# Patient Record
Sex: Male | Born: 1965 | Race: White | Hispanic: No | Smoking: Current some day smoker
Health system: Southern US, Community
[De-identification: ages and names within clinical notes are randomized; demographics above are authoritative.]

## PROBLEM LIST (undated history)

## (undated) DIAGNOSIS — I2510 Atherosclerotic heart disease of native coronary artery without angina pectoris: Secondary | ICD-10-CM

## (undated) DIAGNOSIS — F418 Other specified anxiety disorders: Secondary | ICD-10-CM

## (undated) DIAGNOSIS — Z9889 Other specified postprocedural states: Secondary | ICD-10-CM

## (undated) DIAGNOSIS — G43909 Migraine, unspecified, not intractable, without status migrainosus: Secondary | ICD-10-CM

## (undated) DIAGNOSIS — R569 Unspecified convulsions: Secondary | ICD-10-CM

## (undated) DIAGNOSIS — E785 Hyperlipidemia, unspecified: Secondary | ICD-10-CM

## (undated) HISTORY — DX: Migraine, unspecified, not intractable, without status migrainosus: G43.909

## (undated) HISTORY — PX: CHOLECYSTECTOMY: SHX55

## (undated) HISTORY — DX: Unspecified convulsions: R56.9

## (undated) HISTORY — DX: Other specified anxiety disorders: F41.8

---

## 2017-08-07 DIAGNOSIS — I2510 Atherosclerotic heart disease of native coronary artery without angina pectoris: Secondary | ICD-10-CM

## 2017-08-07 HISTORY — DX: Atherosclerotic heart disease of native coronary artery without angina pectoris: I25.10

## 2017-08-22 ENCOUNTER — Emergency Department (HOSPITAL_COMMUNITY): Payer: 59

## 2017-08-22 ENCOUNTER — Observation Stay (HOSPITAL_COMMUNITY)
Admission: EM | Admit: 2017-08-22 | Discharge: 2017-08-24 | Disposition: A | Discharge status: Home / Self Care | Payer: 59 | Attending: Cardiovascular Disease | Admitting: Cardiovascular Disease

## 2017-08-22 ENCOUNTER — Encounter (HOSPITAL_COMMUNITY): Payer: Self-pay | Admitting: *Deleted

## 2017-08-22 DIAGNOSIS — R55 Syncope and collapse: Secondary | ICD-10-CM | POA: Diagnosis present

## 2017-08-22 DIAGNOSIS — E785 Hyperlipidemia, unspecified: Secondary | ICD-10-CM | POA: Diagnosis not present

## 2017-08-22 DIAGNOSIS — Z8249 Family history of ischemic heart disease and other diseases of the circulatory system: Secondary | ICD-10-CM | POA: Diagnosis not present

## 2017-08-22 DIAGNOSIS — Z955 Presence of coronary angioplasty implant and graft: Secondary | ICD-10-CM

## 2017-08-22 DIAGNOSIS — Z87891 Personal history of nicotine dependence: Secondary | ICD-10-CM | POA: Insufficient documentation

## 2017-08-22 DIAGNOSIS — Z23 Encounter for immunization: Secondary | ICD-10-CM | POA: Diagnosis not present

## 2017-08-22 DIAGNOSIS — E7800 Pure hypercholesterolemia, unspecified: Secondary | ICD-10-CM | POA: Diagnosis not present

## 2017-08-22 DIAGNOSIS — Z7982 Long term (current) use of aspirin: Secondary | ICD-10-CM | POA: Insufficient documentation

## 2017-08-22 DIAGNOSIS — R0789 Other chest pain: Secondary | ICD-10-CM | POA: Diagnosis not present

## 2017-08-22 DIAGNOSIS — M549 Dorsalgia, unspecified: Secondary | ICD-10-CM | POA: Diagnosis present

## 2017-08-22 DIAGNOSIS — R079 Chest pain, unspecified: Secondary | ICD-10-CM | POA: Diagnosis present

## 2017-08-22 DIAGNOSIS — I2510 Atherosclerotic heart disease of native coronary artery without angina pectoris: Secondary | ICD-10-CM | POA: Insufficient documentation

## 2017-08-22 HISTORY — DX: Other specified postprocedural states: Z98.890

## 2017-08-22 HISTORY — DX: Hyperlipidemia, unspecified: E78.5

## 2017-08-22 LAB — CBC
HCT: 45.2 % (ref 39.0–52.0)
Hemoglobin: 16.2 g/dL (ref 13.0–17.0)
MCH: 32.3 pg (ref 26.0–34.0)
MCHC: 35.8 g/dL (ref 30.0–36.0)
MCV: 90.2 fL (ref 78.0–100.0)
Platelets: 150 10*3/uL (ref 150–400)
RBC: 5.01 MIL/uL (ref 4.22–5.81)
RDW: 13 % (ref 11.5–15.5)
WBC: 6.7 10*3/uL (ref 4.0–10.5)

## 2017-08-22 LAB — BASIC METABOLIC PANEL
Anion gap: 9 (ref 5–15)
BUN: 14 mg/dL (ref 6–20)
CO2: 23 mmol/L (ref 22–32)
Calcium: 8.9 mg/dL (ref 8.9–10.3)
Chloride: 107 mmol/L (ref 101–111)
Creatinine, Ser: 0.94 mg/dL (ref 0.61–1.24)
GFR calc Af Amer: >60 mL/min (ref >60)
GFR calc non Af Amer: >60 mL/min (ref >60)
Glucose, Bld: 105 mg/dL — ABNORMAL HIGH (ref 65–99)
Potassium: 3.3 mmol/L — ABNORMAL LOW (ref 3.5–5.1)
Sodium: 139 mmol/L (ref 135–145)

## 2017-08-22 LAB — URINALYSIS, ROUTINE W REFLEX MICROSCOPIC
Bilirubin Urine: NEGATIVE
Glucose, UA: NEGATIVE mg/dL
Hgb urine dipstick: NEGATIVE
Ketones, ur: NEGATIVE mg/dL
Leukocytes, UA: NEGATIVE
Nitrite: NEGATIVE
Protein, ur: NEGATIVE mg/dL
Specific Gravity, Urine: 1.017 (ref 1.005–1.030)
pH: 6 (ref 5.0–8.0)

## 2017-08-22 LAB — CBG MONITORING, ED: Glucose-Capillary: 117 mg/dL — ABNORMAL HIGH (ref 65–99)

## 2017-08-22 LAB — TROPONIN I: Troponin I: <0.03 ng/mL (ref <0.03)

## 2017-08-22 MED ORDER — IOPAMIDOL (ISOVUE-370) INJECTION 76%
100.0000 mL | Freq: Once | INTRAVENOUS | Status: AC | PRN
  Administered 2017-08-22: 100 mL via INTRAVENOUS

## 2017-08-22 MED ORDER — ASPIRIN 81 MG PO CHEW
324.0000 mg | CHEWABLE_TABLET | Freq: Once | ORAL | Status: AC
  Administered 2017-08-22: 324 mg via ORAL
  Filled 2017-08-22: qty 4

## 2017-08-22 MED ORDER — GI COCKTAIL ~~LOC~~: 30.0000 mL | Freq: Four times a day (QID) | ORAL | Status: DC | PRN

## 2017-08-22 MED ORDER — MORPHINE SULFATE (PF) 2 MG/ML IV SOLN: 2.0000 mg | INTRAVENOUS | Status: DC | PRN

## 2017-08-22 MED ORDER — ENOXAPARIN SODIUM 40 MG/0.4ML ~~LOC~~ SOLN
40.0000 mg | SUBCUTANEOUS | Status: DC
  Filled 2017-08-22: qty 0.4

## 2017-08-22 MED ORDER — ONDANSETRON HCL 4 MG/2ML IJ SOLN
4.0000 mg | Freq: Four times a day (QID) | INTRAMUSCULAR | Status: DC | PRN
Start: 2017-08-22 — End: 2017-08-23

## 2017-08-22 MED ORDER — INFLUENZA VAC SPLIT QUAD 0.5 ML IM SUSY
0.5000 mL | PREFILLED_SYRINGE | INTRAMUSCULAR | Status: AC
  Administered 2017-08-24: 08:00:00 0.5 mL via INTRAMUSCULAR
  Filled 2017-08-22: qty 0.5

## 2017-08-22 MED ORDER — ACETAMINOPHEN 325 MG PO TABS: 650.0000 mg | ORAL_TABLET | ORAL | Status: DC | PRN

## 2017-08-22 MED ORDER — SODIUM CHLORIDE 0.9 % IV SOLN
INTRAVENOUS | Status: DC
  Administered 2017-08-22: 19:00:00 via INTRAVENOUS

## 2017-08-22 NOTE — H&P (Signed)
[facility] History and Physical  Charles Tyler YQM:[id number] DOB: 1966/08/31 DOA: 08/22/2017  Referring physician: DR Wilson Singer PCP: Patient, No Pcp Per   Chief Complaint: Chest pain  HPI: Charles Tyler is a 51 y.o. male with hx of hyperlipidemia, CAD w 2 stents ([place], 2016), +family hx CAD (father CABG 4 yo) presenting with chest pain at work today.  Came to ED where EKG and trop were negative.  Patient also c/o back pain, so CT angio of chest was done which did not show any dissection.  CXR was negative.  We are asked to admit for CP.    Patient also says he has been "passing out", "4 times " since he moved here in June 2018.  Reports that he passed out last night after getting up from bed to urinate, while walking back to bed per his girlfirend who he lives with, she "heard a thud" and he was on the floor.  No signs of seizure activity, incontinence and patient didn't recall the event so didn't have much to report about it.  Then today at work patient developed an exertional SSCP w/ upper abd component then started going "into my back" and down both arms.  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  No sweats or N/V, +felt clammy.  Came to ED where EKG was wnl.    Patient says when living in [place] 2 yrs ago they put "2 stents in my heart".  Says he didn't have a major heart attack but they were concerned because of his high cholesterol and family history.  Denies any hx of HTN or DM, no hx claudication or PVD or CVA.  Has some LE pains which he feels is related to a disc in his back.  +etoh, not heavy, no drug use, former smoker.  PSH = cholecystectomy.     Chart: -    ROS  no joint pain   no HA  no blurry vision  no rash  no diarrhea  no nausea/ vomiting  no dysuria  no difficulty voiding  no change in urine color    Past Medical History  Past Medical History:  Diagnosis Date  . High  cholesterol    Past Surgical History  Past Surgical History:  Procedure Laterality Date  . CHOLECYSTECTOMY     Family History No family history on file. Social History  reports that he has quit smoking. He has never used smokeless tobacco. He reports that he drinks alcohol. He reports that he does not use drugs. Allergies No Known Allergies Home medications Prior to Admission medications   Medication Sig Start Date End Date Taking? Authorizing Provider  aspirin EC 81 MG tablet Take 243 mg by mouth once as needed for mild pain or moderate pain.   Yes [provider]  ibuprofen (ADVIL,MOTRIN) 200 MG tablet Take 200 mg by mouth every 6 (six) hours as needed for mild pain or moderate pain.   Yes [provider]   Liver Function Tests No results for input(s): AST, ALT, ALKPHOS, BILITOT, PROT, ALBUMIN in the last 168 hours. No results for input(s): LIPASE, AMYLASE in the last 168 hours. CBC  Recent Labs Lab 08/22/17 1731  WBC 6.7  HGB 16.2  HCT 45.2  MCV 90.2  PLT 528   Basic Metabolic Panel  Recent Labs Lab 08/22/17 1731  NA 139  K 3.3*  CL 107  CO2 23  GLUCOSE 105*  BUN 14  CREATININE 0.94  CALCIUM 8.9     Vitals:   08/22/17 1900 08/22/17 1930 08/22/17 2000 08/22/17 2100  BP: 138/86 138/90 138/90 (!) 143/86  Pulse: (!) 59 (!) 59 70 65  Resp: 12 11 13 16   Temp:      TempSrc:      SpO2: 99% 99% 99% 99%  Weight:      Height:       Exam: Gen alert, looks tired, no distress No rash, cyanosis or gangrene Sclera anicteric, throat clear  No jvd or bruits Chest clear bilat RRR no MRG Abd soft ntnd no mass or ascites +bs GU normal male MS no joint effusions or deformity Ext no LE edema / no wounds or ulcers Neuro is alert, Ox 3 , nf Good pulses x 4 ext, no bruits    Home meds: -ecasa, prn advil  EKG (independ reviewed) > NSR, no acute changes CXR (independ reviewed) > no active disease CT angio chest/ abd  > IMPRESSION: 1. Negative  for aortic dissection or aortic aneurysm. 2. Mild apical emphysema.  Clear lung fields 3. Questionable filling defect at the portal splenic confluence with underfilling of the SMV ; suspect findings are related to arterial phase imaging, but cannot exclude nonocclusive thrombus. Could correlate with portal Doppler imaging to confirm patency of the portal, splenic, and superior mesenteric veins. 4. Minimal atherosclerotic calcification. No significant vascular disease of the major branch vessels in the abdomen    Assessment: 1. Chest pain - in patient w/ hx hyperlipidemia, family hx CAD and hx of "2 stents" placed in heart in [place] 2 yrs ago.  EKG/ trop here are wnl. Will admit, get serial trop and consult cardiology in am.   2. HL 3. Back pain - CTA neg for dissection 4. Syncope - recurrent per patient.  Post-micturition in this instance. Not sure significance.  No arrhythmia at this time.      Plan - as above       Ruth D [facility] Pager [phone]   If 7PM-7AM, please contact night-coverage [email or web address] Password [email or web address] 08/22/2017, 9:48 PM

## 2017-08-22 NOTE — ED Provider Notes (Signed)
[facility] EMERGENCY DEPARTMENT Provider Note   CSN: [id number] Arrival date & time: 08/22/17  1642     History   Chief Complaint Chief Complaint  Patient presents with  . Chest Pain  . Loss of Consciousness    HPI Dontrae Morini is a 51 y.o. male.  HPI  51 year old male with chest pain. Onset this afternoon. Describes an ache in the center of his chest with burning sensation radiating between his shoulder blades and sometimes into L arm. Intermittent throughout the day. Worsened with exertion. Improved with rest. Associated with mild dyspnea. No nausea, diaphoresis or palpitations. Also reports that his girlfriend told him that he had a syncopal this morning when going to the bathroom although he says he doesn't recall this. No known CAD. Hx of high cholesterol. Grandfather with MI while in his 86s. Dad with CABG in 67s. Former smoker but stopped 10y ago. Denies drug use.   Past Medical History:  Diagnosis Date  . High cholesterol     There are no active problems to display for this patient.   Past Surgical History:  Procedure Laterality Date  . CHOLECYSTECTOMY         Home Medications    Prior to Admission medications   Medication Sig Start Date End Date Taking? Authorizing Provider  aspirin EC 81 MG tablet Take 243 mg by mouth once as needed for mild pain or moderate pain.   Yes [provider]  ibuprofen (ADVIL,MOTRIN) 200 MG tablet Take 200 mg by mouth every 6 (six) hours as needed for mild pain or moderate pain.   Yes [provider]    Family History No family history on file.  Social History Social History  Substance Use Topics  . Smoking status: Former Research scientist (life sciences)  . Smokeless tobacco: Never Used  . Alcohol use Yes     Comment: rarely      Allergies   Patient has no known allergies.   Review of Systems Review of Systems  All systems reviewed and negative, other than as noted in HPI.   Physical Exam Updated Vital  Signs BP 138/86   Pulse (!) 59   Temp 98.2 F (36.8 C) (Oral)   Resp 12   Ht 5\' 7"  (1.702 m)   Wt 74.8 kg (165 lb)   SpO2 99%   BMI 25.84 kg/m   Physical Exam  Constitutional: He appears well-developed and well-nourished. No distress.  HENT:  Head: Normocephalic and atraumatic.  Eyes: Conjunctivae are normal. Right eye exhibits no discharge. Left eye exhibits no discharge.  Neck: Neck supple.  Cardiovascular: Normal rate, regular rhythm and normal heart sounds.  Exam reveals no gallop and no friction rub.   No murmur heard. Pulmonary/Chest: Effort normal and breath sounds normal. No respiratory distress.  Abdominal: Soft. He exhibits no distension. There is no tenderness.  Musculoskeletal: He exhibits no edema or tenderness.  Lower extremities symmetric as compared to each other. No calf tenderness. Negative Homan's. No palpable cords.   Neurological: He is alert.  Skin: Skin is warm and dry.  Psychiatric: He has a normal mood and affect. His behavior is normal. Thought content normal.  Nursing note and vitals reviewed.    ED Treatments / Results  Labs (all labs ordered are listed, but only abnormal results are displayed) Labs Reviewed  BASIC METABOLIC PANEL - Abnormal; Notable for the following:       Result Value   Potassium 3.3 (*)    Glucose, Bld 105 (*)  All other components within normal limits  CBG MONITORING, ED - Abnormal; Notable for the following:    Glucose-Capillary 117 (*)    All other components within normal limits  CBC  TROPONIN I  URINALYSIS, ROUTINE W REFLEX MICROSCOPIC    EKG  EKG Interpretation None      EKG:  Rhythm: normal sinus Rate: 63 PR: 154 ms QRS: 100 ms QTc: 384 ms ST segments: normal  Radiology Dg Chest 2 View  Result Date: 08/22/2017 CLINICAL DATA:  Intermittent chest pain radiating to the arms and back, some shortness of breath, syncopal episode today EXAM: CHEST  2 VIEW COMPARISON:  None. FINDINGS: No active  infiltrate or effusion is seen. Mediastinal and hilar contours are unremarkable. The heart is within normal limits in size. Old healed fracture involving the right sixth rib is noted posteriorly. IMPRESSION: No active cardiopulmonary disease. Electronically Signed   By: Ivar Drape M.D.   On: 08/22/2017 17:13   Ct Angio Chest/abd/pel For Dissection W And/or Wo Contrast  Result Date: 08/22/2017 CLINICAL DATA:  Chest pain EXAM: CT ANGIOGRAPHY CHEST, ABDOMEN AND PELVIS TECHNIQUE: Multidetector CT imaging through the chest, abdomen and pelvis was performed using the standard protocol during bolus administration of intravenous contrast. Multiplanar reconstructed images and MIPs were obtained and reviewed to evaluate the vascular anatomy. CONTRAST:  100 mL Isovue 370 intravenous COMPARISON:  Radiograph 08/22/2017 FINDINGS: CTA CHEST FINDINGS Cardiovascular: Non contrasted images through the chest demonstrate no evidence for intramural hematoma. Nonaneurysmal aorta. No dissection is seen. Normal heart size. No significant pericardial effusion. Although not ideal protocol for assessment of pulmonary emboli, no discrete filling defects are seen within the central pulmonary artery branches. Mediastinum/Nodes: Midline trachea. No thyroid mass. No significant adenopathy. Esophagus within normal limits. Lungs/Pleura: Minimal apical emphysema. No pneumothorax, pleural effusion or acute pulmonary infiltrate. Musculoskeletal: No acute or suspicious bone lesion. Review of the MIP images confirms the above findings. CTA ABDOMEN AND PELVIS FINDINGS VASCULAR Aorta: Normal caliber aorta without aneurysm, dissection, vasculitis or significant stenosis. Celiac: Patent without evidence of aneurysm, dissection, vasculitis or significant stenosis. Replaced right hepatic artery from the SMA. SMA: Patent without evidence of aneurysm, dissection, vasculitis or significant stenosis. Renals: Both renal arteries are patent without evidence of  aneurysm, dissection, vasculitis, fibromuscular dysplasia or significant stenosis. IMA: Patent without evidence of aneurysm, dissection, vasculitis or significant stenosis. Inflow: Patent without evidence of aneurysm, dissection, vasculitis or significant stenosis. Veins: Questionable filling defect at the portal splenic confluence with underfilling of the SMV. Review of the MIP images confirms the above findings. NON-VASCULAR Hepatobiliary: No focal liver abnormality is seen. Status post cholecystectomy. Mildly prominent extrahepatic bile duct likely due to post cholecystectomy change. Pancreas: Unremarkable. No pancreatic ductal dilatation or surrounding inflammatory changes. Spleen: Heterogeneous, likely due to arterial phase enhancement Adrenals/Urinary Tract: Adrenal glands are unremarkable. Kidneys are normal, without renal calculi, focal lesion, or hydronephrosis. Bladder is unremarkable. Stomach/Bowel: Stomach is within normal limits. Appendix appears normal. No evidence of bowel wall thickening, distention, or inflammatory changes. Lymphatic: No significantly enlarged abdominal or pelvic lymph nodes. Reproductive: Prostate is unremarkable. Other: Negative for free air or free fluid. Small fat in the umbilicus. Musculoskeletal: Degenerative changes. No acute or suspicious finding. Review of the MIP images confirms the above findings. IMPRESSION: 1. Negative for aortic dissection or aortic aneurysm. 2. Mild apical emphysema.  Clear lung fields 3. Questionable filling defect at the portal splenic confluence with underfilling of the SMV ; suspect findings are related to arterial phase imaging,  but cannot exclude nonocclusive thrombus. Could correlate with portal Doppler imaging to confirm patency of the portal, splenic, and superior mesenteric veins. 4. Minimal atherosclerotic calcification. No significant vascular disease of the major branch vessels in the abdomen Electronically Signed   By: Donavan Foil M.D.    On: 08/22/2017 19:58    Procedures Procedures (including critical care time)  Medications Ordered in ED Medications  0.9 %  sodium chloride infusion ( Intravenous New Bag/Given 08/22/17 1908)  iopamidol (ISOVUE-370) 76 % injection 100 mL (100 mLs Intravenous Contrast Given 08/22/17 1917)     Initial Impression / Assessment and Plan / ED Course  I have reviewed the triage vital signs and the nursing notes.  Pertinent labs & imaging results that were available during my care of the patient were reviewed by me and considered in my medical decision making (see chart for details).     51 year old male with chest pain. Concerned for possible ACS with pain brought on by exertion, improved with rest and associated dyspnea. EKG not crossing over ito MUSE, but it is actually pretty normal looking. Possible syncopal event this morning reported by girlfriend but seems odd that he doesn't seem to have a recollection of it himself. CT neg for dissection. Admit for r/o.   Final Clinical Impressions(s) / ED Diagnoses   Final diagnoses:  Chest pain, unspecified type    New Prescriptions New Prescriptions   No medications on file     Virgel Manifold, MD 08/23/17 1115

## 2017-08-22 NOTE — ED Notes (Signed)
Pt states he would like to eat. Will notify Dr. Wilson Singer

## 2017-08-22 NOTE — ED Triage Notes (Signed)
Pt c/o intermittent mid chest pain that radiates down bilateral arms and into the back. Pt c/o SOB during the chest pain episodes. Pt reports that his girlfriend told him that he went to the bathroom this morning and then came back to the bed and passed out on the floor. Pt reports he doesn't remember going to the bathroom or the syncopal episode.

## 2017-08-22 NOTE — ED Notes (Signed)
Pt to CT

## 2017-08-22 NOTE — ED Notes (Signed)
Pt's girlfriend stated he went unconscious for around 10 seconds and it looked as if he stopped breathing. Will notify Dr. Wilson Singer

## 2017-08-22 NOTE — ED Notes (Signed)
Pt informed of need for urine sample. Pt states he cannot provide one at this time but will inform nursing staff when able to do so. 

## 2017-08-23 ENCOUNTER — Observation Stay (HOSPITAL_BASED_OUTPATIENT_CLINIC_OR_DEPARTMENT_OTHER): Payer: 59

## 2017-08-23 ENCOUNTER — Encounter (HOSPITAL_COMMUNITY): Payer: Self-pay | Admitting: Physician Assistant

## 2017-08-23 ENCOUNTER — Encounter (HOSPITAL_COMMUNITY)
Admission: EM | Disposition: A | Discharge status: Home / Self Care | Payer: Self-pay | Source: Home / Self Care | Attending: Emergency Medicine

## 2017-08-23 DIAGNOSIS — I200 Unstable angina: Secondary | ICD-10-CM

## 2017-08-23 DIAGNOSIS — Z8249 Family history of ischemic heart disease and other diseases of the circulatory system: Secondary | ICD-10-CM

## 2017-08-23 DIAGNOSIS — I2510 Atherosclerotic heart disease of native coronary artery without angina pectoris: Secondary | ICD-10-CM | POA: Diagnosis not present

## 2017-08-23 DIAGNOSIS — Z87898 Personal history of other specified conditions: Secondary | ICD-10-CM | POA: Diagnosis not present

## 2017-08-23 DIAGNOSIS — E7800 Pure hypercholesterolemia, unspecified: Secondary | ICD-10-CM | POA: Diagnosis not present

## 2017-08-23 DIAGNOSIS — R55 Syncope and collapse: Secondary | ICD-10-CM | POA: Diagnosis not present

## 2017-08-23 DIAGNOSIS — E782 Mixed hyperlipidemia: Secondary | ICD-10-CM

## 2017-08-23 DIAGNOSIS — R079 Chest pain, unspecified: Secondary | ICD-10-CM

## 2017-08-23 DIAGNOSIS — R072 Precordial pain: Secondary | ICD-10-CM | POA: Diagnosis not present

## 2017-08-23 DIAGNOSIS — R0789 Other chest pain: Secondary | ICD-10-CM | POA: Diagnosis not present

## 2017-08-23 HISTORY — PX: LEFT HEART CATH AND CORONARY ANGIOGRAPHY: CATH118249

## 2017-08-23 LAB — PROTIME-INR
INR: 1.08
PROTHROMBIN TIME: 13.9 s (ref 11.4–15.2)

## 2017-08-23 LAB — BASIC METABOLIC PANEL
ANION GAP: 7 (ref 5–15)
BUN: 11 mg/dL (ref 6–20)
CO2: 23 mmol/L (ref 22–32)
Calcium: 8.6 mg/dL — ABNORMAL LOW (ref 8.9–10.3)
Chloride: 109 mmol/L (ref 101–111)
Creatinine, Ser: 0.96 mg/dL (ref 0.61–1.24)
GFR calc Af Amer: >60 mL/min (ref >60)
Glucose, Bld: 93 mg/dL (ref 65–99)
POTASSIUM: 3.3 mmol/L — AB (ref 3.5–5.1)
SODIUM: 139 mmol/L (ref 135–145)

## 2017-08-23 LAB — CBC
HCT: 44.9 % (ref 39.0–52.0)
HEMOGLOBIN: 15.7 g/dL (ref 13.0–17.0)
MCH: 31.5 pg (ref 26.0–34.0)
MCHC: 35 g/dL (ref 30.0–36.0)
MCV: 90.2 fL (ref 78.0–100.0)
Platelets: 138 10*3/uL — ABNORMAL LOW (ref 150–400)
RBC: 4.98 MIL/uL (ref 4.22–5.81)
RDW: 12.9 % (ref 11.5–15.5)
WBC: 6.5 10*3/uL (ref 4.0–10.5)

## 2017-08-23 LAB — TROPONIN I

## 2017-08-23 LAB — ECHOCARDIOGRAM COMPLETE
Height: 67 in
WEIGHTICAEL: 2639.52 [oz_av]

## 2017-08-23 LAB — CREATININE, SERUM
Creatinine, Ser: 0.94 mg/dL (ref 0.61–1.24)
GFR calc Af Amer: >60 mL/min (ref >60)
GFR calc non Af Amer: >60 mL/min (ref >60)

## 2017-08-23 SURGERY — LEFT HEART CATH AND CORONARY ANGIOGRAPHY
Anesthesia: LOCAL

## 2017-08-23 MED ORDER — SODIUM CHLORIDE 0.9 % IV SOLN: 250.0000 mL | INTRAVENOUS | Status: DC | PRN

## 2017-08-23 MED ORDER — SODIUM CHLORIDE 0.9 % WEIGHT BASED INFUSION
3.0000 mL/kg/h | INTRAVENOUS | Status: DC
  Administered 2017-08-23: 3 mL/kg/h via INTRAVENOUS

## 2017-08-23 MED ORDER — SODIUM CHLORIDE 0.9% FLUSH: 3.0000 mL | INTRAVENOUS | Status: DC | PRN

## 2017-08-23 MED ORDER — ATORVASTATIN CALCIUM 40 MG PO TABS: 80.0000 mg | ORAL_TABLET | Freq: Every day | ORAL | Status: DC

## 2017-08-23 MED ORDER — SODIUM CHLORIDE 0.9 % WEIGHT BASED INFUSION: 3.0000 mL/kg/h | INTRAVENOUS | Status: DC

## 2017-08-23 MED ORDER — ACETAMINOPHEN 325 MG PO TABS: 650.0000 mg | ORAL_TABLET | ORAL | Status: DC | PRN

## 2017-08-23 MED ORDER — FENTANYL CITRATE (PF) 100 MCG/2ML IJ SOLN
INTRAMUSCULAR | Status: DC | PRN
  Administered 2017-08-23: 50 ug via INTRAVENOUS

## 2017-08-23 MED ORDER — HEPARIN SODIUM (PORCINE) 5000 UNIT/ML IJ SOLN
5000.0000 [IU] | Freq: Three times a day (TID) | INTRAMUSCULAR | Status: DC
  Administered 2017-08-23 – 2017-08-24 (×2): 5000 [IU] via SUBCUTANEOUS
  Filled 2017-08-23 (×2): qty 1

## 2017-08-23 MED ORDER — ONDANSETRON HCL 4 MG/2ML IJ SOLN
4.0000 mg | Freq: Four times a day (QID) | INTRAMUSCULAR | Status: DC | PRN
  Administered 2017-08-24: 01:00:00 4 mg via INTRAVENOUS
  Filled 2017-08-23: qty 2

## 2017-08-23 MED ORDER — IOPAMIDOL (ISOVUE-370) INJECTION 76%
INTRAVENOUS | Status: DC | PRN
  Administered 2017-08-23: 60 mL via INTRA_ARTERIAL

## 2017-08-23 MED ORDER — ASPIRIN 81 MG PO CHEW
81.0000 mg | CHEWABLE_TABLET | ORAL | Status: AC
  Administered 2017-08-23: 81 mg via ORAL
  Filled 2017-08-23: qty 1

## 2017-08-23 MED ORDER — HEPARIN (PORCINE) IN NACL 2-0.9 UNIT/ML-% IJ SOLN
INTRAMUSCULAR | Status: AC
  Filled 2017-08-23: qty 1000

## 2017-08-23 MED ORDER — MIDAZOLAM HCL 2 MG/2ML IJ SOLN
INTRAMUSCULAR | Status: DC | PRN
  Administered 2017-08-23 (×2): 1 mg via INTRAVENOUS

## 2017-08-23 MED ORDER — POTASSIUM CHLORIDE CRYS ER 20 MEQ PO TBCR
40.0000 meq | EXTENDED_RELEASE_TABLET | Freq: Once | ORAL | Status: AC
  Administered 2017-08-23: 40 meq via ORAL

## 2017-08-23 MED ORDER — IOPAMIDOL (ISOVUE-370) INJECTION 76%
INTRAVENOUS | Status: AC
  Filled 2017-08-23: qty 100

## 2017-08-23 MED ORDER — MIDAZOLAM HCL 2 MG/2ML IJ SOLN
INTRAMUSCULAR | Status: AC
Start: 2017-08-23 — End: 2017-08-23
  Filled 2017-08-23: qty 2

## 2017-08-23 MED ORDER — ASPIRIN 81 MG PO CHEW
81.0000 mg | CHEWABLE_TABLET | Freq: Every day | ORAL | Status: DC
  Administered 2017-08-24: 81 mg via ORAL
  Filled 2017-08-23: qty 1

## 2017-08-23 MED ORDER — HEPARIN SODIUM (PORCINE) 1000 UNIT/ML IJ SOLN
INTRAMUSCULAR | Status: DC | PRN
  Administered 2017-08-23: 4000 [IU] via INTRAVENOUS

## 2017-08-23 MED ORDER — HEPARIN (PORCINE) IN NACL 2-0.9 UNIT/ML-% IJ SOLN
INTRAMUSCULAR | Status: AC | PRN
  Administered 2017-08-23: 1000 mL

## 2017-08-23 MED ORDER — SODIUM CHLORIDE 0.9% FLUSH: 3.0000 mL | Freq: Two times a day (BID) | INTRAVENOUS | Status: DC

## 2017-08-23 MED ORDER — SODIUM CHLORIDE 0.9 % WEIGHT BASED INFUSION: 1.0000 mL/kg/h | INTRAVENOUS | Status: DC

## 2017-08-23 MED ORDER — FENTANYL CITRATE (PF) 100 MCG/2ML IJ SOLN
INTRAMUSCULAR | Status: AC
  Filled 2017-08-23: qty 2

## 2017-08-23 MED ORDER — OXYCODONE HCL 5 MG PO TABS
5.0000 mg | ORAL_TABLET | ORAL | Status: DC | PRN
  Administered 2017-08-24: 5 mg via ORAL
  Filled 2017-08-23 (×2): qty 1

## 2017-08-23 MED ORDER — LIDOCAINE HCL 2 % IJ SOLN
INTRAMUSCULAR | Status: AC
  Filled 2017-08-23: qty 10

## 2017-08-23 MED ORDER — VERAPAMIL HCL 2.5 MG/ML IV SOLN
INTRAVENOUS | Status: DC | PRN
  Administered 2017-08-23: 14:00:00 via INTRA_ARTERIAL

## 2017-08-23 MED ORDER — HEPARIN SODIUM (PORCINE) 1000 UNIT/ML IJ SOLN
INTRAMUSCULAR | Status: AC
  Filled 2017-08-23: qty 1

## 2017-08-23 MED ORDER — SODIUM CHLORIDE 0.9 % IV SOLN: INTRAVENOUS | Status: AC

## 2017-08-23 MED ORDER — MORPHINE SULFATE (PF) 4 MG/ML IV SOLN: 2.0000 mg | INTRAVENOUS | Status: DC | PRN

## 2017-08-23 MED ORDER — ASPIRIN 81 MG PO CHEW: 81.0000 mg | CHEWABLE_TABLET | ORAL | Status: DC

## 2017-08-23 MED ORDER — LIDOCAINE HCL 2 % IJ SOLN
INTRAMUSCULAR | Status: DC | PRN
  Administered 2017-08-23: 3 mL

## 2017-08-23 MED ORDER — VERAPAMIL HCL 2.5 MG/ML IV SOLN
INTRAVENOUS | Status: AC
Start: 2017-08-23 — End: 2017-08-23
  Filled 2017-08-23: qty 2

## 2017-08-23 MED ORDER — ATORVASTATIN CALCIUM 40 MG PO TABS
80.0000 mg | ORAL_TABLET | ORAL | Status: AC
Start: 2017-08-23 — End: 2017-08-23
  Administered 2017-08-23: 80 mg via ORAL
  Filled 2017-08-23: qty 2

## 2017-08-23 SURGICAL SUPPLY — 10 items
CATH INFINITI 5 FR JL3.5 (CATHETERS) ×2 IMPLANT
CATH INFINITI JR4 5F (CATHETERS) ×2 IMPLANT
DEVICE RAD COMP TR BAND LRG (VASCULAR PRODUCTS) ×2 IMPLANT
GLIDESHEATH SLEND A-KIT 6F 22G (SHEATH) ×2 IMPLANT
GUIDEWIRE INQWIRE 1.5J.035X260 (WIRE) ×1 IMPLANT
INQWIRE 1.5J .035X260CM (WIRE) ×2
KIT HEART LEFT (KITS) ×2 IMPLANT
PACK CARDIAC CATHETERIZATION (CUSTOM PROCEDURE TRAY) ×2 IMPLANT
TRANSDUCER W/STOPCOCK (MISCELLANEOUS) ×2 IMPLANT
TUBING CIL FLEX 10 FLL-RA (TUBING) ×2 IMPLANT

## 2017-08-23 NOTE — Consult Note (Signed)
Cardiology Consultation:   Patient ID: Charles Tyler; [id number]; 04-01-1966   Admit date: 08/22/2017 Date of Consult: 08/23/2017  Primary Care Provider: Patient, No Pcp Per Consulting Cardiologist: Dr. Satira Sark   Patient Profile:   Charles Tyler is a 51 y.o. male with a hx of prior cardiac catheterization (possible PCI but details not clear) who is being seen today for the evaluation of chest pain and syncope at the request of Dr. Roderic Palau.  History of Present Illness:   Charles Tyler is a 51 year old male patient who just moved here in June from [place] and has history of possible CAD status post stenting 2 2016 in [place]. He has strong family history of early CAD father with CABG at 16, HLD. Troponins negative  3, CT angios the chest showed no dissection.  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 Yesterday, developed squeezing chest pain and throbbing with shooting pains into his back with any activity, eased with rest. Also has recurrent syncope for many years. Always occurs with change in position-getting up quickly and he just hits the floor. Happened yesterday. He comes back quickly. Hasn't taken anything. Girlfriend says he stops breathing at night.   Past Medical History:  Diagnosis Date  . History of cardiac catheterization    Done in [place] - details not clear  . Hyperlipidemia     Past Surgical History:  Procedure Laterality Date  . CHOLECYSTECTOMY       Home Medications:  Prior to Admission medications   Medication Sig Start Date End Date Taking? Authorizing Provider  aspirin EC 81 MG tablet Take 243 mg by mouth once as needed for mild pain or moderate pain.   Yes [provider]  ibuprofen (ADVIL,MOTRIN) 200 MG tablet Take 200 mg by mouth every 6 (six) hours as needed for mild pain or moderate pain.   Yes [provider]    Inpatient Medications: Scheduled Meds: . enoxaparin (LOVENOX) injection  40 mg Subcutaneous Q24H  . Influenza vac split quadrivalent PF  0.5 mL Intramuscular Tomorrow-1000    PRN Meds: acetaminophen, gi cocktail, morphine injection, ondansetron (ZOFRAN) IV  Allergies:   No Known Allergies  Social History:   Social History   Social History  . Marital status: Legally Separated    Spouse name: N/A  . Number of children: 4  . Years of education: N/A   Occupational History  .  [employer]   Social History Main Topics  . Smoking status: Former Research scientist (life sciences)  . Smokeless tobacco: Never Used  . Alcohol use Yes     Comment: rarely   . Drug use: No  . Sexual activity: Not on file   Other Topics Concern  . Not on file   Social History Narrative  . No narrative on file    Family History:     Family History  Problem Relation Age of Onset  . CAD Father        Premature CAD status post CABG  . CAD Paternal Grandfather        Premature CAD status post CABG     ROS:  Please see the history of present illness.  Review of Systems  Constitution: Negative.  HENT: Negative.   Cardiovascular: Positive for chest pain and dyspnea on exertion.  Respiratory: Positive for snoring.   Endocrine: Negative.   Hematologic/Lymphatic: Negative.   Musculoskeletal: Negative.  Gastrointestinal: Negative.   Genitourinary: Negative.   Neurological: Negative.     All other ROS reviewed and negative.     Physical Exam/Data:   Vitals:   08/22/17 2130 08/22/17 2200 08/22/17 2340 08/23/17 0524  BP: 138/89 (!) 145/87 127/77 132/71  Pulse: (!) 58 (!) 59 63 62  Resp: 18 20 20 20   Temp:   98 F (36.7 C) 98.2 F (36.8 C)  TempSrc:   Oral Oral  SpO2: 97% 98% 97% 98%  Weight:   164 lb 15.5 oz (74.8 kg)   Height:   5\' 7"  (1.702 m)    No intake or output data in the 24 hours ending 08/23/17 0934 Filed Weights   08/22/17 1651 08/22/17 2340  Weight: 165 lb (74.8 kg) 164 lb  15.5 oz (74.8 kg)   Body mass index is 25.84 kg/m.   General:  Well nourished, well developed, in no acute distress HEENT: normal Lymph: no adenopathy Neck: no JVD Endocrine:  No thryomegaly Vascular: No carotid bruits; FA pulses 2+ bilaterally without bruits  Cardiac:  normal S1, S2; RRR; no murmur  Lungs:  clear to auscultation bilaterally, no wheezing, rhonchi or rales  Abd: soft, nontender, no hepatomegaly  Ext: no edema Musculoskeletal:  No deformities, BUE and BLE strength normal and equal Skin: warm and dry  Neuro:  CNs 2-12 intact, no focal abnormalities noted Psych:  Normal affect   EKG:  The EKG was personally reviewed and demonstrates:  Sinus brady with LAFB Telemetry:  Telemetry was personally reviewed and demonstrates:  Sinus brady in 50's  Laboratory Data:  Chemistry  Recent Labs Lab 08/22/17 1731  NA 139  K 3.3*  CL 107  CO2 23  GLUCOSE 105*  BUN 14  CREATININE 0.94  CALCIUM 8.9  GFRNONAA >60  GFRAA >60  ANIONGAP 9    Hematology  Recent Labs Lab 08/22/17 1731  WBC 6.7  RBC 5.01  HGB 16.2  HCT 45.2  MCV 90.2  MCH 32.3  MCHC 35.8  RDW 13.0  PLT 150   Cardiac Enzymes  Recent Labs Lab 08/22/17 1731 08/23/17 0004 08/23/17 0311  TROPONINI <0.03 <0.03 <0.03   No results for input(s): TROPIPOC in the last 168 hours.   Radiology/Studies:  Dg Chest 2 View  Result Date: 08/22/2017 CLINICAL DATA:  Intermittent chest pain radiating to the arms and back, some shortness of breath, syncopal episode today EXAM: CHEST  2 VIEW COMPARISON:  None. FINDINGS: No active infiltrate or effusion is seen. Mediastinal and hilar contours are unremarkable. The heart is within normal limits in size. Old healed fracture involving the right sixth rib is noted posteriorly. IMPRESSION: No active cardiopulmonary disease. Electronically Signed   By: Ivar Drape M.D.   On: 08/22/2017 17:13   Ct Angio Chest/abd/pel For Dissection W And/or Wo Contrast  Result Date:  08/22/2017 CLINICAL DATA:  Chest pain EXAM: CT ANGIOGRAPHY CHEST, ABDOMEN AND PELVIS TECHNIQUE: Multidetector CT imaging through the chest, abdomen and pelvis was performed using the standard protocol during bolus administration of intravenous contrast. Multiplanar reconstructed images and MIPs were obtained and reviewed to evaluate the vascular anatomy. CONTRAST:  100 mL Isovue 370 intravenous COMPARISON:  Radiograph 08/22/2017 FINDINGS: CTA CHEST FINDINGS Cardiovascular: Non contrasted images through the chest demonstrate no evidence for intramural hematoma. Nonaneurysmal aorta. No dissection is seen. Normal heart size. No significant pericardial effusion. Although not ideal protocol for assessment of pulmonary emboli, no discrete filling defects are seen within the central pulmonary artery branches. Mediastinum/Nodes:  Midline trachea. No thyroid mass. No significant adenopathy. Esophagus within normal limits. Lungs/Pleura: Minimal apical emphysema. No pneumothorax, pleural effusion or acute pulmonary infiltrate. Musculoskeletal: No acute or suspicious bone lesion. Review of the MIP images confirms the above findings. CTA ABDOMEN AND PELVIS FINDINGS VASCULAR Aorta: Normal caliber aorta without aneurysm, dissection, vasculitis or significant stenosis. Celiac: Patent without evidence of aneurysm, dissection, vasculitis or significant stenosis. Replaced right hepatic artery from the SMA. SMA: Patent without evidence of aneurysm, dissection, vasculitis or significant stenosis. Renals: Both renal arteries are patent without evidence of aneurysm, dissection, vasculitis, fibromuscular dysplasia or significant stenosis. IMA: Patent without evidence of aneurysm, dissection, vasculitis or significant stenosis. Inflow: Patent without evidence of aneurysm, dissection, vasculitis or significant stenosis. Veins: Questionable filling defect at the portal splenic confluence with underfilling of the SMV. Review of the MIP images  confirms the above findings. NON-VASCULAR Hepatobiliary: No focal liver abnormality is seen. Status post cholecystectomy. Mildly prominent extrahepatic bile duct likely due to post cholecystectomy change. Pancreas: Unremarkable. No pancreatic ductal dilatation or surrounding inflammatory changes. Spleen: Heterogeneous, likely due to arterial phase enhancement Adrenals/Urinary Tract: Adrenal glands are unremarkable. Kidneys are normal, without renal calculi, focal lesion, or hydronephrosis. Bladder is unremarkable. Stomach/Bowel: Stomach is within normal limits. Appendix appears normal. No evidence of bowel wall thickening, distention, or inflammatory changes. Lymphatic: No significantly enlarged abdominal or pelvic lymph nodes. Reproductive: Prostate is unremarkable. Other: Negative for free air or free fluid. Small fat in the umbilicus. Musculoskeletal: Degenerative changes. No acute or suspicious finding. Review of the MIP images confirms the above findings. IMPRESSION: 1. Negative for aortic dissection or aortic aneurysm. 2. Mild apical emphysema.  Clear lung fields 3. Questionable filling defect at the portal splenic confluence with underfilling of the SMV ; suspect findings are related to arterial phase imaging, but cannot exclude nonocclusive thrombus. Could correlate with portal Doppler imaging to confirm patency of the portal, splenic, and superior mesenteric veins. 4. Minimal atherosclerotic calcification. No significant vascular disease of the major branch vessels in the abdomen Electronically Signed   By: Donavan Foil M.D.   On: 08/22/2017 19:58    Assessment and Plan:   1. Chest pain with mixed features, concerning for unstable angina. Troponins negative and EKG nonacute. CT negative. Need to assess with cath vs nuclear stress. Has eaten a little this am. 2. Reported CAD S/P possible stenting x 2 2016 Charles Tyler, [place]. Details not clear. 3. Recurrent syncope, sounds vasovagal. No pauses or  arrhythmias on tele. Check orthostatics. 4. HLD, not on treatment. Told his "bad cholesterol" was over 500 in 2014. 5. Former smoker 6. Family hx early CAD-father CABG 37 yo.   For questions or updates, please contact [place] Please consult [email or web address] for contact info under Cardiology/STEMI.   Sumner Boast, PA-C 08/23/2017 9:34 AM    Attending note:  Patient seen and examined. Discussed case with Ms. Bonnell Public PA-C and agree with her assessment. Patient recently relocated from [place] to [place], [place], with his girlfriend. He currently works at [employer]. He states that he underwent a cardiac catheterization approximately 2 years ago in [place], [place] specifically recall whether he had any stents placed, but it does sound like he was on a "blood thinner" for about a year thereafter. He has not had regular cardiology follow-up. He also reports a family history of premature CAD in his father and paternal grandfather, also personal history of hyperlipidemia and is currently untreated. He states that the day before yesterday he worked  like normal, actually unloaded a truck full of boxes, did heavy lifting and other labor without any significant limitations. Yesterday he got up to go to the bathroom and felt a vague discomfort in his chest, also had an episode of syncope while going back to bed. For the rest of the day with any level of activity he had moderate chest discomfort described as a tightness, also radiation and burning feeling in his back and neck. Symptoms continued throughout the day and ultimately came to the ER for further assessment.  On evaluation this morning he reports no chest pain at rest. Systolic blood pressure in the 120s to 130s. Heart rate 50s to 60s in sinus rhythm by telemetry which I personally reviewed. Lungs are clear without labored breathing. Cardiac exam reveals RRR with soft systolic murmur. Extremities show no pitting edema. Lab  work shows negative troponin I 3, creatinine 0.94, hemoglobin 16.2, platelets 150. Chest CT angiography was negative for aortic dissection, did show some mild apical emphysematous changes, minimal atherosclerotic calcification. I personally reviewed his ECG which shows sinus rhythm with left anterior fascicular block.  Patient presents with symptoms concerning for unstable angina, new onset within the last 24 hours with exertion. Troponin I levels are negative at this time and ECG is nonspecific with left anterior fascicular block. Details of cardiac history are not available, he possibly underwent stent interventions 2 years ago in [place], does not have stent cards with him, is not on any specific medications at this time. Also reports history of significant hyperlipidemia that is presently untreated with medications and a family history of premature CAD in male relatives. Plan is to transfer him to [facility] in anticipation of a diagnostic cardiac catheterization. Risks and benefits discussed with him and he is in agreement to proceed. Initiate aspirin and high-dose statin therapy, follow-up FLP.  Satira Sark, M.D., F.A.C.C.

## 2017-08-23 NOTE — Interval H&P Note (Signed)
Cath Lab Visit (complete for each Cath Lab visit)  Clinical Evaluation Leading to the Procedure:   ACS: Yes.    Non-ACS:    Anginal Classification: CCS III  Anti-ischemic medical therapy: Minimal Therapy (1 class of medications)  Non-Invasive Test Results: No non-invasive testing performed  Prior CABG: No previous CABG      History and Physical Interval Note:  08/23/2017 1:50 PM  Modesto Charon  has presented today for surgery, with the diagnosis of cp  The various methods of treatment have been discussed with the patient and family. After consideration of risks, benefits and other options for treatment, the patient has consented to  Procedure(s): LEFT HEART CATH AND CORONARY ANGIOGRAPHY (N/A) as a surgical intervention .  The patient's history has been reviewed, patient examined, no change in status, stable for surgery.  I have reviewed the patient's chart and labs.  Questions were answered to the patient's satisfaction.     Belva Crome III

## 2017-08-23 NOTE — Progress Notes (Signed)
[facility] leaving to take patient to [facility] cath lab now.

## 2017-08-23 NOTE — Progress Notes (Signed)
*  PRELIMINARY RESULTS* Echocardiogram 2D Echocardiogram has been performed.  Leavy Cella 08/23/2017, 10:56 AM

## 2017-08-24 ENCOUNTER — Telehealth: Payer: Self-pay

## 2017-08-24 ENCOUNTER — Encounter (HOSPITAL_COMMUNITY): Payer: Self-pay | Admitting: Interventional Cardiology

## 2017-08-24 ENCOUNTER — Other Ambulatory Visit: Payer: Self-pay | Admitting: Cardiology

## 2017-08-24 DIAGNOSIS — E7800 Pure hypercholesterolemia, unspecified: Secondary | ICD-10-CM | POA: Diagnosis not present

## 2017-08-24 DIAGNOSIS — R55 Syncope and collapse: Secondary | ICD-10-CM | POA: Diagnosis not present

## 2017-08-24 DIAGNOSIS — I2510 Atherosclerotic heart disease of native coronary artery without angina pectoris: Secondary | ICD-10-CM | POA: Diagnosis not present

## 2017-08-24 DIAGNOSIS — R0789 Other chest pain: Secondary | ICD-10-CM | POA: Diagnosis not present

## 2017-08-24 DIAGNOSIS — R079 Chest pain, unspecified: Secondary | ICD-10-CM | POA: Diagnosis not present

## 2017-08-24 LAB — BASIC METABOLIC PANEL
ANION GAP: 6 (ref 5–15)
BUN: 11 mg/dL (ref 6–20)
CHLORIDE: 109 mmol/L (ref 101–111)
CO2: 22 mmol/L (ref 22–32)
Calcium: 8.4 mg/dL — ABNORMAL LOW (ref 8.9–10.3)
Creatinine, Ser: 0.9 mg/dL (ref 0.61–1.24)
GFR calc non Af Amer: >60 mL/min (ref >60)
Glucose, Bld: 99 mg/dL (ref 65–99)
POTASSIUM: 3.6 mmol/L (ref 3.5–5.1)
SODIUM: 137 mmol/L (ref 135–145)

## 2017-08-24 LAB — LIPID PANEL
CHOL/HDL RATIO: 6.1 ratio
CHOLESTEROL: 182 mg/dL (ref 0–200)
HDL: 30 mg/dL — AB (ref >40)
LDL Cholesterol: 133 mg/dL — ABNORMAL HIGH (ref 0–99)
TRIGLYCERIDES: 94 mg/dL (ref <150)
VLDL: 19 mg/dL (ref 0–40)

## 2017-08-24 LAB — HIV ANTIBODY (ROUTINE TESTING W REFLEX): HIV SCREEN 4TH GENERATION: NONREACTIVE

## 2017-08-24 MED ORDER — ASPIRIN EC 81 MG PO TBEC: 81.0000 mg | DELAYED_RELEASE_TABLET | Freq: Every day | ORAL | Status: DC

## 2017-08-24 NOTE — Discharge Summary (Signed)
Discharge Summary    Patient ID: Charles Tyler,  MRN: [id number], DOB/AGE: Sep 15, 1966 51 y.o.  Admit date: 08/22/2017 Discharge date: 08/24/2017  Primary Care Provider: Patient, No Pcp Per Primary Cardiologist: Domenic Polite   Discharge Diagnoses    Principal Problem:   Chest pain Active Problems:   History of coronary artery stent placement   Hyperlipidemia   Back pain   Syncope  Allergies No Known Allergies  Diagnostic Studies/Procedures    Cath: 08/23/17  Study Conclusions  - Left ventricle: The cavity size was normal. Wall thickness was   normal. Systolic function was normal. The estimated ejection   fraction was in the range of 60% to 65%. Wall motion was normal;   there were no regional wall motion abnormalities. Left   ventricular diastolic function parameters were normal. - Aortic valve: Mildly calcified annulus. Trileaflet. - Mitral valve: There was trivial regurgitation. 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 - Atrial septum: No defect or patent foramen ovale was identified. - Tricuspid valve: There was trivial regurgitation. - Pulmonary arteries: Systolic pressure could not be accurately   estimated. - Pericardium, extracardiac: There was no pericardial effusion.  Impressions:  - Normal LV wall thickness with LVEF 60-65% and normal diastolic   function. Trivial mitral regurgitation. Mildly calcified aortic   annulus. Trivial tricuspid regurgitation.  Cath: 08/23/17  Conclusion     Mid LAD lesion, 25 %stenosed.  Ost 1st Diag lesion, 50 %stenosed.  The left ventricular systolic function is normal.  LV end diastolic pressure is normal.  The left ventricular ejection fraction is 50-55% by visual estimate.    Right dominant coronary anatomy.  Minimal luminal irregularities noted in LAD and first diagonal.  Normal LV function and end-diastolic pressure. EF estimated to be 55%.  RECOMMENDATIONS:   No  significant obstructive coronary disease. Normal LV function and hemodynamics.  Further evaluation for chest pain and syncope per treating team.  _____________   History of Present Illness     Charles Tyler is a 51 year old male patient who just moved here in June from [place] and has history of possible CAD status post stenting 2 2016 in [place]. He has strong family history of early CAD father with CABG at 20, HLD. Troponins negative  3, CT angio of the chest showed no dissection.  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 The day prior to admission developed squeezing chest pain and throbbing with shooting pains into his back with any activity, eased with rest. Also has recurrent syncope for many years. Always occurs with change in position-getting up quickly and he just hits the floor. Happened again the day prior to admission. He comes back quickly. He was seen by Dr. Domenic Polite while at [facility] and transferred to [facility] for definitive cath.    Hospital Course     Underwent cardiac cath with Dr. Tamala Julian noted above with no significant CAD, no previous stents and normal LV function. Echo showed normal EF with trivial mitral/tricuspid regurg. Post cath labs showed stable Cr 0.90 and Hgb 15.7. LDL was 133. Given his hx of syncope he was set up for outpatient event monitor. No complications noted post cath.    Charles Tyler was seen by Dr. Burt Knack and determined stable for discharge home. Follow up in the office has been arranged. Medications are listed below.  _____________  Discharge Vitals Blood  pressure 116/79, pulse 64, temperature 98.1 F (36.7 C), temperature source Oral, resp. rate 16, height 5\' 7"  (1.702 m), weight 158 lb 6.4 oz (71.8 kg), SpO2 99 %.  Filed Weights   08/22/17 1651 08/22/17 2340 08/24/17 0400  Weight: 165 lb (74.8 kg) 164 lb 15.5 oz (74.8 kg) 158 lb  6.4 oz (71.8 kg)    Labs & Radiologic Studies    CBC  Recent Labs  08/22/17 1731 08/23/17 1949  WBC 6.7 6.5  HGB 16.2 15.7  HCT 45.2 44.9  MCV 90.2 90.2  PLT 150 008*   Basic Metabolic Panel  Recent Labs  08/23/17 0311 08/23/17 1949 08/24/17 0244  NA 139  --  137  K 3.3*  --  3.6  CL 109  --  109  CO2 23  --  22  GLUCOSE 93  --  99  BUN 11  --  11  CREATININE 0.96 0.94 0.90  CALCIUM 8.6*  --  8.4*   Liver Function Tests No results for input(s): AST, ALT, ALKPHOS, BILITOT, PROT, ALBUMIN in the last 72 hours. No results for input(s): LIPASE, AMYLASE in the last 72 hours. Cardiac Enzymes  Recent Labs  08/22/17 1731 08/23/17 0004 08/23/17 0311  TROPONINI <0.03 <0.03 <0.03   BNP Invalid input(s): POCBNP D-Dimer No results for input(s): DDIMER in the last 72 hours. Hemoglobin A1C No results for input(s): HGBA1C in the last 72 hours. Fasting Lipid Panel  Recent Labs  08/24/17 0244  CHOL 182  HDL 30*  LDLCALC 133*  TRIG 94  CHOLHDL 6.1   Thyroid Function Tests No results for input(s): TSH, T4TOTAL, T3FREE, THYROIDAB in the last 72 hours.  Invalid input(s): FREET3 _____________  Dg Chest 2 View  Result Date: 08/22/2017 CLINICAL DATA:  Intermittent chest pain radiating to the arms and back, some shortness of breath, syncopal episode today EXAM: CHEST  2 VIEW COMPARISON:  None. FINDINGS: No active infiltrate or effusion is seen. Mediastinal and hilar contours are unremarkable. The heart is within normal limits in size. Old healed fracture involving the right sixth rib is noted posteriorly. IMPRESSION: No active cardiopulmonary disease. Electronically Signed   By: Ivar Drape M.D.   On: 08/22/2017 17:13   Ct Angio Chest/abd/pel For Dissection W And/or Wo Contrast  Result Date: 08/22/2017 CLINICAL DATA:  Chest pain EXAM: CT ANGIOGRAPHY CHEST, ABDOMEN AND PELVIS TECHNIQUE: Multidetector CT imaging through the chest, abdomen and pelvis was performed using  the standard protocol during bolus administration of intravenous contrast. Multiplanar reconstructed images and MIPs were obtained and reviewed to evaluate the vascular anatomy. CONTRAST:  100 mL Isovue 370 intravenous COMPARISON:  Radiograph 08/22/2017 FINDINGS: CTA CHEST FINDINGS Cardiovascular: Non contrasted images through the chest demonstrate no evidence for intramural hematoma. Nonaneurysmal aorta. No dissection is seen. Normal heart size. No significant pericardial effusion. Although not ideal protocol for assessment of pulmonary emboli, no discrete filling defects are seen within the central pulmonary artery branches. Mediastinum/Nodes: Midline trachea. No thyroid mass. No significant adenopathy. Esophagus within normal limits. Lungs/Pleura: Minimal apical emphysema. No pneumothorax, pleural effusion or acute pulmonary infiltrate. Musculoskeletal: No acute or suspicious bone lesion. Review of the MIP images confirms the above findings. CTA ABDOMEN AND PELVIS FINDINGS VASCULAR Aorta: Normal caliber aorta without aneurysm, dissection, vasculitis or significant stenosis. Celiac: Patent without evidence of aneurysm, dissection, vasculitis or significant stenosis. Replaced right hepatic artery from the SMA. SMA: Patent without evidence of aneurysm, dissection, vasculitis or significant stenosis. Renals: Both renal arteries are patent without  evidence of aneurysm, dissection, vasculitis, fibromuscular dysplasia or significant stenosis. IMA: Patent without evidence of aneurysm, dissection, vasculitis or significant stenosis. Inflow: Patent without evidence of aneurysm, dissection, vasculitis or significant stenosis. Veins: Questionable filling defect at the portal splenic confluence with underfilling of the SMV. Review of the MIP images confirms the above findings. NON-VASCULAR Hepatobiliary: No focal liver abnormality is seen. Status post cholecystectomy. Mildly prominent extrahepatic bile duct likely due to post  cholecystectomy change. Pancreas: Unremarkable. No pancreatic ductal dilatation or surrounding inflammatory changes. Spleen: Heterogeneous, likely due to arterial phase enhancement Adrenals/Urinary Tract: Adrenal glands are unremarkable. Kidneys are normal, without renal calculi, focal lesion, or hydronephrosis. Bladder is unremarkable. Stomach/Bowel: Stomach is within normal limits. Appendix appears normal. No evidence of bowel wall thickening, distention, or inflammatory changes. Lymphatic: No significantly enlarged abdominal or pelvic lymph nodes. Reproductive: Prostate is unremarkable. Other: Negative for free air or free fluid. Small fat in the umbilicus. Musculoskeletal: Degenerative changes. No acute or suspicious finding. Review of the MIP images confirms the above findings. IMPRESSION: 1. Negative for aortic dissection or aortic aneurysm. 2. Mild apical emphysema.  Clear lung fields 3. Questionable filling defect at the portal splenic confluence with underfilling of the SMV ; suspect findings are related to arterial phase imaging, but cannot exclude nonocclusive thrombus. Could correlate with portal Doppler imaging to confirm patency of the portal, splenic, and superior mesenteric veins. 4. Minimal atherosclerotic calcification. No significant vascular disease of the major branch vessels in the abdomen Electronically Signed   By: Donavan Foil M.D.   On: 08/22/2017 19:58   Disposition   Pt is being discharged home today in good condition.  Follow-up Plans & Appointments    Follow-up Information    Satira Sark, MD Follow up on 10/09/2017.   Specialty:  Cardiology Why:  at 4pm for your follow up appt.  Contact information: [place] [address] [phone]          Discharge Instructions    Call MD for:  redness, tenderness, or signs of infection (pain, swelling, redness, odor or green/yellow discharge around incision site)    Complete by:  As directed    Diet  - low sodium heart healthy    Complete by:  As directed    Discharge instructions    Complete by:  As directed    Radial Site Care Refer to this sheet in the next few weeks. These instructions provide you with information on caring for yourself after your procedure. Your caregiver may also give you more specific instructions. Your treatment has been planned according to current medical practices, but problems sometimes occur. Call your caregiver if you have any problems or questions after your procedure. HOME CARE INSTRUCTIONS You may shower the day after the procedure.Remove the bandage (dressing) and gently wash the site with plain soap and water.Gently pat the site dry.  Do not apply powder or lotion to the site.  Do not submerge the affected site in water for 3 to 5 days.  Inspect the site at least twice daily.  Do not flex or bend the affected arm for 24 hours.  No lifting over 5 pounds (2.3 kg) for 5 days after your procedure.  Do not drive home if you are discharged the same day of the procedure. Have someone else drive you.  You may drive 24 hours after the procedure unless otherwise instructed by your caregiver.  What to expect: Any bruising will usually fade within 1 to 2  weeks.  Blood that collects in the tissue (hematoma) may be painful to the touch. It should usually decrease in size and tenderness within 1 to 2 weeks.  SEEK IMMEDIATE MEDICAL CARE IF: You have unusual pain at the radial site.  You have redness, warmth, swelling, or pain at the radial site.  You have drainage (other than a small amount of blood on the dressing).  You have chills.  You have a fever or persistent symptoms for more than 72 hours.  You have a fever and your symptoms suddenly get worse.  Your arm becomes pale, cool, tingly, or numb.  You have heavy bleeding from the site. Hold pressure on the site.   Increase activity slowly    Complete by:  As directed       Discharge Medications       Medication List    TAKE these medications   aspirin EC 81 MG tablet Take 243 mg by mouth once as needed for mild pain or moderate pain.   ibuprofen 200 MG tablet Commonly known as:  ADVIL,MOTRIN Take 200 mg by mouth every 6 (six) hours as needed for mild pain or moderate pain.        Outstanding Labs/Studies   Out patient cardiac monitor.   Duration of Discharge Encounter   Greater than 30 minutes including physician time.  Signed, Reino Bellis NP-C 08/24/2017, 10:11 AM   Patient seen, examined. Available data reviewed. Agree with findings, assessment, and plan as outlined by Reino Bellis, NP-C. On exam, the patient is alert and oriented, in no distress. Lungs are clear. Heart is regular rate and rhythm with no murmur. Abdomen soft nontender. Extremities show no edema. The right radial site is clear. I have reviewed the patient's echocardiogram and cardiac catheterization results. He has patent coronary arteries with minor nonobstructive disease. He has normal LV function and no significant valvular disease. The patient has no prodrome to his syncopal episodes. In my experience we typically do not find a clear cause of syncope in patients who have had long-standing recurrent syncopal events without any clear etiology identified. However, this patient has never undergone outpatient telemetry monitoring. Recommend an event monitor and cardiology follow-up in [place]. If no arrhythmias identified on his monitor, I would not anticipate any further cardiac evaluation.  Sherren Mocha, M.D. 08/24/2017 10:21 AM

## 2017-08-24 NOTE — Care Management Note (Signed)
Case Management Note  Patient Details  Name: Charles Tyler MRN: [id number] Date of Birth: 23-May-1966  Subjective/Objective:  From home with wife, pta indep, s/p left heart cath/coronary angiography, no CAD obstruction.  He states his wife is working on getting him a PCP, she has already spoke to the MD they are looking at and she just needs to schedule an apt.                   Action/Plan: NCM will follow for dc needs.  Expected Discharge Date:  08/23/17               Expected Discharge Plan:  Home/Self Care  In-House Referral:     Discharge planning Services  CM Consult  Post Acute Care Choice:    Choice offered to:     DME Arranged:    DME Agency:     HH Arranged:    HH Agency:     Status of Service:  Completed, signed off  If discussed at [employer] of Stay Meetings, dates discussed:    Additional Comments:  Zenon Mayo, RN 08/24/2017, 9:24 AM

## 2017-08-24 NOTE — Telephone Encounter (Signed)
DONE

## 2017-08-24 NOTE — Telephone Encounter (Signed)
-----   Message from Desma Paganini sent at 08/24/2017 10:09 AM EDT ----- Angelina Ok NP would like this pt to have a 30 day event--scheduled to see SM on 10/09/17, she's placed order   Thank you

## 2017-08-25 ENCOUNTER — Observation Stay (HOSPITAL_COMMUNITY)
Admission: EM | Admit: 2017-08-25 | Discharge: 2017-08-26 | Disposition: A | Discharge status: Home / Self Care | Payer: 59 | Attending: Internal Medicine | Admitting: Internal Medicine

## 2017-08-25 ENCOUNTER — Observation Stay (HOSPITAL_COMMUNITY): Payer: 59

## 2017-08-25 ENCOUNTER — Encounter (HOSPITAL_COMMUNITY): Payer: Self-pay | Admitting: Emergency Medicine

## 2017-08-25 ENCOUNTER — Emergency Department (HOSPITAL_COMMUNITY): Payer: 59

## 2017-08-25 ENCOUNTER — Other Ambulatory Visit: Payer: Self-pay

## 2017-08-25 DIAGNOSIS — Z79899 Other long term (current) drug therapy: Secondary | ICD-10-CM | POA: Insufficient documentation

## 2017-08-25 DIAGNOSIS — Z7982 Long term (current) use of aspirin: Secondary | ICD-10-CM | POA: Diagnosis not present

## 2017-08-25 DIAGNOSIS — Z87891 Personal history of nicotine dependence: Secondary | ICD-10-CM | POA: Insufficient documentation

## 2017-08-25 DIAGNOSIS — G40909 Epilepsy, unspecified, not intractable, without status epilepticus: Secondary | ICD-10-CM | POA: Diagnosis not present

## 2017-08-25 DIAGNOSIS — R569 Unspecified convulsions: Secondary | ICD-10-CM

## 2017-08-25 DIAGNOSIS — Z955 Presence of coronary angioplasty implant and graft: Secondary | ICD-10-CM | POA: Insufficient documentation

## 2017-08-25 DIAGNOSIS — I2510 Atherosclerotic heart disease of native coronary artery without angina pectoris: Secondary | ICD-10-CM | POA: Diagnosis not present

## 2017-08-25 DIAGNOSIS — Z951 Presence of aortocoronary bypass graft: Secondary | ICD-10-CM | POA: Insufficient documentation

## 2017-08-25 LAB — RAPID URINE DRUG SCREEN, HOSP PERFORMED
Amphetamines: NOT DETECTED
Barbiturates: NOT DETECTED
Benzodiazepines: NOT DETECTED
Cocaine: NOT DETECTED
OPIATES: NOT DETECTED
Tetrahydrocannabinol: NOT DETECTED

## 2017-08-25 LAB — CBC
HCT: 48.4 % (ref 39.0–52.0)
Hemoglobin: 17.3 g/dL — ABNORMAL HIGH (ref 13.0–17.0)
MCH: 31.9 pg (ref 26.0–34.0)
MCHC: 35.7 g/dL (ref 30.0–36.0)
MCV: 89.1 fL (ref 78.0–100.0)
PLATELETS: 127 10*3/uL — AB (ref 150–400)
RBC: 5.43 MIL/uL (ref 4.22–5.81)
RDW: 12.8 % (ref 11.5–15.5)
WBC: 6.8 10*3/uL (ref 4.0–10.5)

## 2017-08-25 LAB — BASIC METABOLIC PANEL
Anion gap: 8 (ref 5–15)
BUN: 8 mg/dL (ref 6–20)
CALCIUM: 8.9 mg/dL (ref 8.9–10.3)
CHLORIDE: 109 mmol/L (ref 101–111)
CO2: 21 mmol/L — AB (ref 22–32)
CREATININE: 0.89 mg/dL (ref 0.61–1.24)
GFR calc non Af Amer: >60 mL/min (ref >60)
GLUCOSE: 95 mg/dL (ref 65–99)
Potassium: 3.8 mmol/L (ref 3.5–5.1)
Sodium: 138 mmol/L (ref 135–145)

## 2017-08-25 MED ORDER — ACETAMINOPHEN 325 MG PO TABS
650.0000 mg | ORAL_TABLET | Freq: Once | ORAL | Status: AC
  Administered 2017-08-25: 650 mg via ORAL
  Filled 2017-08-25: qty 2

## 2017-08-25 MED ORDER — SENNOSIDES-DOCUSATE SODIUM 8.6-50 MG PO TABS: 1.0000 | ORAL_TABLET | Freq: Every evening | ORAL | Status: DC | PRN

## 2017-08-25 MED ORDER — ENOXAPARIN SODIUM 40 MG/0.4ML ~~LOC~~ SOLN
40.0000 mg | SUBCUTANEOUS | Status: DC
  Administered 2017-08-25: 40 mg via SUBCUTANEOUS
  Filled 2017-08-25: qty 0.4

## 2017-08-25 MED ORDER — ONDANSETRON HCL 4 MG PO TABS: 4.0000 mg | ORAL_TABLET | Freq: Four times a day (QID) | ORAL | Status: DC | PRN

## 2017-08-25 MED ORDER — LEVETIRACETAM 500 MG PO TABS
500.0000 mg | ORAL_TABLET | Freq: Two times a day (BID) | ORAL | Status: DC
  Administered 2017-08-25 – 2017-08-26 (×2): 500 mg via ORAL
  Filled 2017-08-25 (×2): qty 1

## 2017-08-25 MED ORDER — SODIUM CHLORIDE 0.9 % IV SOLN
1000.0000 mg | Freq: Once | INTRAVENOUS | Status: AC
  Administered 2017-08-25: 1000 mg via INTRAVENOUS
  Filled 2017-08-25: qty 10

## 2017-08-25 MED ORDER — ACETAMINOPHEN 325 MG PO TABS
650.0000 mg | ORAL_TABLET | ORAL | Status: DC | PRN
  Administered 2017-08-25: 650 mg via ORAL
  Filled 2017-08-25: qty 2

## 2017-08-25 MED ORDER — ONDANSETRON HCL 4 MG/2ML IJ SOLN: 4.0000 mg | Freq: Four times a day (QID) | INTRAMUSCULAR | Status: DC | PRN

## 2017-08-25 MED ORDER — GADOBENATE DIMEGLUMINE 529 MG/ML IV SOLN
20.0000 mL | Freq: Once | INTRAVENOUS | Status: AC
  Administered 2017-08-25: 16 mL via INTRAVENOUS

## 2017-08-25 MED ORDER — ASPIRIN EC 81 MG PO TBEC
81.0000 mg | DELAYED_RELEASE_TABLET | Freq: Every day | ORAL | Status: DC
  Administered 2017-08-26: 81 mg via ORAL
  Filled 2017-08-25: qty 1

## 2017-08-25 MED ORDER — SODIUM CHLORIDE 0.9% FLUSH
3.0000 mL | Freq: Two times a day (BID) | INTRAVENOUS | Status: DC
  Administered 2017-08-25 – 2017-08-26 (×2): 3 mL via INTRAVENOUS

## 2017-08-25 NOTE — ED Notes (Signed)
Per CT, pt is next in line for scan 

## 2017-08-25 NOTE — ED Notes (Signed)
Pt returned from MRI °

## 2017-08-25 NOTE — ED Notes (Signed)
Pt remains in imaging.

## 2017-08-25 NOTE — Procedures (Signed)
EEG Report  Clinical History:  Recurrent "Seizure  Type" activity.  He has a history of depression and suicidal attempts.  Technical Summary:  A 19 channel digital EEG recording was performed using the 10-20 international system of electrode placement.  Bipolar and Referential montages were used.  The total recording time was approx 20 minutes.  Findings:  There is a very well developed and regulated posterior dominant rhythm of 9 Hz reactive to eye opening and closure.  No focal slowing is present.  Photic stimulation does not produce a driving response and does not elicit any abnormalities.  Hyperventilation does not produce an effect on the resting record and does not elicit any abnormalities.  Sleep and drowsiness are not recorded. Throughout the recording there are no epileptiform discharges or electrographic seizures present.   The patient has a brief period of unresponsiveness and mild shaking which is not associated with EEG changes.    Impression:  This is a normal EEG in the awake state.  This does not rule out underlying epilepsy.  If clinical suspicion remains high, then a more prolonged and/or sleep deprived EEG may be of additional value.  One event during the recording was non-epileptic in nature.  Clinical correlation is advised.    Rogue Jury, MS, MD

## 2017-08-25 NOTE — Progress Notes (Signed)
EEG Completed; Results Pending  

## 2017-08-25 NOTE — Progress Notes (Signed)
Patient arrived to unit. Patient states that he is "tired and sore". Patient looks fatigue and drowsy. Asked if patient fell in the last 31months-Patient states "I passed out a couple of days ago". He states that he was "d/c yesterday from Cardiology". Patient denies any needs at this time- Will continue to monitor

## 2017-08-25 NOTE — ED Provider Notes (Signed)
[facility] EMERGENCY DEPARTMENT Provider Note   CSN: [id number] Arrival date & time: 08/25/17  0944     History   Chief Complaint Chief Complaint  Patient presents with  . Seizures    HPI Charles Tyler is a 51 y.o. male. Level V caveat secondary to patient amnestic to event and acuity of condition HPI  52 year old male presents today with new onset seizures.  Patient's wife of 6 months is at the bedside and gives history. She states that he was discharged from the hospital yesterday. Review of hospital records reveal he came in with some new chest pain and had a cardiac catheterizationand had no significant obstructive coronary disease noted. She reports that they went home last night and were "taking it easy" as he was still tired from the anesthesia. This morning they were laying in bed. She noted that he had some jerking type activity that came and went for up to 10 minutes. He was not responsive in between.He was confused and has had decreased responsiveness since that time. She describes the activity as being present when EMS came.per nursing report EMS states he had 2 episodes ofsecond was duration of grand mal seizure. They report that he complained of headache.  Past Medical History:  Diagnosis Date  . History of cardiac catheterization    Done in [place] - details not clear  . Hyperlipidemia     Patient Active Problem List   Diagnosis Date Noted  . Chest pain 08/22/2017  . History of coronary artery stent placement 08/22/2017  . Hyperlipidemia 08/22/2017  . Back pain 08/22/2017  . Syncope 08/22/2017    Past Surgical History:  Procedure Laterality Date  . CHOLECYSTECTOMY    . LEFT HEART CATH AND CORONARY ANGIOGRAPHY N/A 08/23/2017   Procedure: LEFT HEART CATH AND CORONARY ANGIOGRAPHY;  Surgeon: Belva Crome, MD;  Location: [facility];  Service: Cardiovascular;  Laterality: N/A;       Home Medications    Prior to Admission  medications   Medication Sig Start Date End Date Taking? Authorizing Provider  aspirin EC 81 MG tablet Take 1 tablet (81 mg total) by mouth daily. 08/24/17  Yes Cheryln Manly, NP  ibuprofen (ADVIL,MOTRIN) 200 MG tablet Take 200 mg by mouth every 6 (six) hours as needed for mild pain or moderate pain.   Yes [provider]    Family History Family History  Problem Relation Age of Onset  . CAD Father        Premature CAD status post CABG  . CAD Paternal Grandfather        Premature CAD status post CABG    Social History Social History  Substance Use Topics  . Smoking status: Former Research scientist (life sciences)  . Smokeless tobacco: Never Used  . Alcohol use Yes     Comment: rarely      Allergies   Patient has no known allergies.   Review of Systems Review of Systems  Unable to perform ROS: Mental status change     Physical Exam Updated Vital Signs BP 130/78   Pulse 65   Temp 98.6 F (37 C) (Oral)   Resp 14   SpO2 98%   Physical Exam  Constitutional: He appears well-developed and well-nourished. No distress.  Appears uncomfortable  HENT:  Head: Normocephalic and atraumatic.  Right Ear: External ear normal.  Left Ear: External ear normal.  Mouth/Throat: Oropharynx is clear and moist.  no contusions or abrasions of tongue noted  Eyes: Pupils are equal, round, and reactive to light.  Neck: Normal range of motion.  Cardiovascular: Normal rate and regular rhythm.   Pulmonary/Chest: Effort normal and breath sounds normal.  Abdominal: Soft. Bowel sounds are normal.  Musculoskeletal: Normal range of motion.  Neurological:  Patient is somewhat slow to respond and keeps his eyes closed but appears to be answering questions correctly. He reports the date and his birthday correctly.  Skin: Skin is warm. Capillary refill takes less than 2 seconds.  Psychiatric: He has a normal mood and affect. His behavior is normal. Thought content normal.  Nursing note and vitals  reviewed.    ED Treatments / Results  Labs (all labs ordered are listed, but only abnormal results are displayed) Labs Reviewed  BASIC METABOLIC PANEL - Abnormal; Notable for the following:       Result Value   CO2 21 (*)    All other components within normal limits  CBC - Abnormal; Notable for the following:    Hemoglobin 17.3 (*)    Platelets 127 (*)    All other components within normal limits  CBG MONITORING, ED    EKG  EKG Interpretation  Date/Time:  Friday August 25 2017 09:45:15 EDT Ventricular Rate:  62 PR Interval:    QRS Duration: 119 QT Interval:  372 QTC Calculation: 378 R Axis:   -23 Text Interpretation:  Sinus rhythm Probable left ventricular hypertrophy Confirmed by Pattricia Boss [phone]) on 08/25/2017 11:41:44 AM       Radiology No results found.  Procedures Procedures (including critical care time)  Medications Ordered in ED Medications  levETIRAcetam (KEPPRA) 1,000 mg in sodium chloride 0.9 % 100 mL IVPB (not administered)     Initial Impression / Assessment and Plan / ED Course  I have reviewed the triage vital signs and the nursing notes.  Pertinent labs & imaging results that were available during my care of the patient were reviewed by me and considered in my medical decision making (see chart for details).    35- RN reports repeat seizure.  Complaining of headache. Keppra ordered. Discussed with neurology. Discussed Dr. Jari Favre  Patient had thired seizure here in ed and postictal by the time I arrived.  CRITICAL CARE Performed by: Shaune Pollack Total critical care time: 30 minutes Critical care time was exclusive of separately billable procedures and treating other patients. Critical care was necessary to treat or prevent imminent or life-threatening deterioration. Critical care was time spent personally by me on the following activities: development of treatment plan with patient and/or surrogate as well as nursing,  discussions with consultants, evaluation of patient's response to treatment, examination of patient, obtaining history from patient or surrogate, ordering and performing treatments and interventions, ordering and review of laboratory studies, ordering and review of radiographic studies, pulse oximetry and re-evaluation of patient's condition.  Final Clinical Impressions(s) / ED Diagnoses   Final diagnoses:  New onset seizure [facility])    New Prescriptions New Prescriptions   No medications on file     Pattricia Boss, MD 08/25/17 [phone]

## 2017-08-25 NOTE — ED Notes (Signed)
Rn was called into the room by [facility] for Pt seizing.  When this RN walked in the room Pt was shaking for 5 seconds. Took in a deep breath and looked around the room. Pt is currently post-ictal.

## 2017-08-25 NOTE — ED Triage Notes (Signed)
Pt arrives from home via [place] reporting new onset seizures today, 2 with family, 2 witnessed by EMS.  EMS reports duration 30 sec., reports initial post-ictal period longer than subsequent episodes.  EMS reports pt had clean heart cath yesterday. Pt oriented to self and place on arrival, disoriented to time and situation. Pt c/o HA.
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
## 2017-08-25 NOTE — H&P (Signed)
History and Physical  Charles Tyler OZD:[id number] DOB: 1966-08-31 DOA: 08/25/2017  Referring physician: Dr. Adelina Mings PCP: Patient, No Pcp Per  Outpatient Specialists: None Patient coming from: Home & is able to ambulate independently  Chief Complaint: New onset seizure  HPI: Charles Tyler is a 51 y.o. male with medical history significant for recent admission on 08/23/2017 at [facility] previously on hospitalist service during workup for chest pain and recurrent syncope found to have no significant obstructive coronary disease and no previous stents (though patient initially informed team that he had a history of stents).  Time of discharge, given his history of syncope he was set up for outpatient event monitoring by cardiology. Patient presented today reported new onset seizure witnessed by family and EMS.    History obtained from wife since patient is confused :  Patient's wife states that patient was still tired from the anesthesia from his most recent admission day prior.  This morning while laying in bed she noted "whole body shaking, every once in awhile his eyes would roll back, he also would make a gurgling sound". Each episode lasted 15 seconds. He'd be incoherent for 45 seconds to a minute. That happened about 3 times. She reported no urine/fecal incontinence. He did not bite his tongue or cheeks. She did make sure to turn him on his side during the episode  She reported he was unresponsive between episodes He repeated the same jerking type activity in front of EMS.  EMS reported duration of 30 seconds and postictal.  Followed by subsequent episodes per chart review.  .    ED Course: Afebrile, Normal oxygen saturation, HR: 56-73 BP: 105/71-145/99, RR: 16-20. BMP significant for glucose of 95 otherwise unremarkable.    CBC White count 6.8, hemoglobin 17.3, platelets 127  CT head obtained in the ED: No acute intracranial hemorrhage.  No focal mass lesion.  No evidence of  acute infarction  Per neurology recommendations patient was loaded with IV KeppraPatient was afebrile EKG obtained in ED shows sinus rhythm with no acute ischemic changes.  Neurology was consulted given new onset seizures  Review of Systems: Patient seen in the ED . Pt complains of confusion, headache, seizure activity  Pt denies any changes in vision, chest pain, abdominal pain, nausea/vomiting, diarrhea, fecal/urinary incontincence .  Review of systems are otherwise negative   Past Medical History:  Diagnosis Date  . History of cardiac catheterization    Done in [place] - details not clear  . Hyperlipidemia    Past Surgical History:  Procedure Laterality Date  . CHOLECYSTECTOMY    . LEFT HEART CATH AND CORONARY ANGIOGRAPHY N/A 08/23/2017   Procedure: LEFT HEART CATH AND CORONARY ANGIOGRAPHY;  Surgeon: Belva Crome, MD;  Location: [place] [facility];  Service: Cardiovascular;  Laterality: N/A;    Social History:  reports that he has quit smoking. He has never used smokeless tobacco. He reports that he drinks alcohol. He reports that he does not use drugs.   No Known Allergies  Family History  Problem Relation Age of Onset  . CAD Father        Premature CAD status post CABG  . CAD Paternal Grandfather        Premature CAD status post CABG      Prior to Admission medications   Medication Sig Start Date End Date Taking? Authorizing Provider  aspirin EC 81 MG tablet Take 1 tablet (81 mg total) by mouth daily. 08/24/17  Yes Cheryln Manly,  NP  ibuprofen (ADVIL,MOTRIN) 200 MG tablet Take 200 mg by mouth every 6 (six) hours as needed for mild pain or moderate pain.   Yes [provider]    Physical Exam: BP 125/80   Pulse 64   Temp 98.6 F (37 C) (Oral)   Resp 13   SpO2 97%   General: Lying in bed, hesitant to talk Eyes: Anicteric sclera, red eyes ENT: Dry oral mucosa Neck: No appreciable abnormalities Cardiovascular: Regular rate and rhythm, no  murmurs, rubs, or gallops, no peripheral edema, no appreciable JVD Respiratory: Lungs clear to auscultation on anterior chest field, and no respiratory distress Abdomen: Soft, nontender, nondistended, normoactive bowel sounds Skin: Dry and intact Musculoskeletal: Normal range of motion Neurologic: Disoriented, able to name month but did not know year, unsure of reason why in hospital, able to say wife accompanied him          Labs on Admission:  Basic Metabolic Panel:  Recent Labs Lab 08/22/17 1731 08/23/17 0311 08/23/17 1949 08/24/17 0244 08/25/17 0955  NA 139 139  --  137 138  K 3.3* 3.3*  --  3.6 3.8  CL 107 109  --  109 109  CO2 23 23  --  22 21*  GLUCOSE 105* 93  --  99 95  BUN 14 11  --  11 8  CREATININE 0.94 0.96 0.94 0.90 0.89  CALCIUM 8.9 8.6*  --  8.4* 8.9   Liver Function Tests: No results for input(s): AST, ALT, ALKPHOS, BILITOT, PROT, ALBUMIN in the last 168 hours. No results for input(s): LIPASE, AMYLASE in the last 168 hours. No results for input(s): AMMONIA in the last 168 hours. CBC:  Recent Labs Lab 08/22/17 1731 08/23/17 1949 08/25/17 0955  WBC 6.7 6.5 6.8  HGB 16.2 15.7 17.3*  HCT 45.2 44.9 48.4  MCV 90.2 90.2 89.1  PLT 150 138* 127*   Cardiac Enzymes:  Recent Labs Lab 08/22/17 1731 08/23/17 0004 08/23/17 0311  TROPONINI <0.03 <0.03 <0.03    BNP (last 3 results) No results for input(s): BNP in the last 8760 hours.  ProBNP (last 3 results) No results for input(s): PROBNP in the last 8760 hours.  CBG:  Recent Labs Lab 08/22/17 1657  GLUCAP 117*    Radiological Exams on Admission: Ct Head Wo Contrast  Result Date: 08/25/2017 CLINICAL DATA:  Possible seizure no hx of seizure EXAM: CT HEAD WITHOUT CONTRAST TECHNIQUE: Contiguous axial images were obtained from the base of the skull through the vertex without intravenous contrast. COMPARISON:  None. FINDINGS: Brain: No acute intracranial hemorrhage. No focal mass lesion. No CT  evidence of acute infarction. No midline shift or mass effect. No hydrocephalus. Basilar cisterns are patent. Vascular: No hyperdense vessel or unexpected calcification. Skull: Normal. Negative for fracture or focal lesion. Sinuses/Orbits: Paranasal sinuses and mastoid air cells are clear. Orbits are clear. Other: None. IMPRESSION: Normal head CT. Electronically Signed   By: Suzy Bouchard M.D.   On: 08/25/2017 13:33    EKG: Independently reviewed.  EKG showed normal sinus rhythm.  With no acute ischemic changes.  Assessment/Plan Present on Admission: **None**  Active Problems:   New onset seizure ([place])   Seizures ([place])   New onset seizure Witnessed by family and EMS.  It seems patient had post ictal period as well.  Unclear if psychogenic will need to rule out any organic reason.  Most likely related to infection given afebrile without any leukocytosis or localizing infectious symptoms.  Additionally, no  metabolic derangements to explain presentation.  In terms of possible cardiac origin, reassured by normal EKG in addition to recent left heart cath that was normal. -Neurology consulted and following recommendations: Status post Remote, Keppra 500 mg twice daily, seizure precautions -Follow seizure precautions on discharge (driving restrictions, operating heavy machinery) -Monitor on telemetry -MRI brain, EEG - UDS, UA  DVT prophylaxis: Lovenox  Code Status:  full code   Family Communication: Spoke with wife by phone updated on plan  Disposition Plan: Follow-up brain MRI, rule out organic cause of seizure  Consults called: Neurology.  Admission status: Observation    Desiree Hane MD [facility]  Pager [phone]  If 7PM-7AM, please contact night-coverage [email or web address] Password [email or web address]  08/25/2017, 3:01 PM

## 2017-08-25 NOTE — ED Notes (Signed)
Pt gone to imaging

## 2017-08-26 DIAGNOSIS — R569 Unspecified convulsions: Secondary | ICD-10-CM | POA: Diagnosis not present

## 2017-08-26 MED ORDER — LEVETIRACETAM 500 MG PO TABS: 500.0000 mg | ORAL_TABLET | Freq: Two times a day (BID) | ORAL | 2 refills | Status: DC

## 2017-08-26 NOTE — Discharge Summary (Signed)
Discharge Summary  Charles Tyler OEU:[id number] DOB: 06-27-1966  PCP: Patient, No Pcp Per  Admit date: 08/25/2017 Discharge date: 08/26/2017  Time spent: 25 minutes   Recommendations for Outpatient Follow-up:  1. Advised counseling/psychiatry follow-up for likely depression 2. Neurology follow-up arranged for long-term monitoring.  Instructed to continue [place]  Discharge Diagnoses:  Active Hospital Problems   Diagnosis Date Noted  . New onset seizure ([place]) 08/25/2017  . Seizures ([place]) 08/25/2017    Resolved Hospital Problems   Diagnosis Date Noted Date Resolved  No resolved problems to display.    Discharge Condition: stable   Diet recommendation: Regular   Vitals:   08/26/17 0506 08/26/17 1020  BP: (!) 105/58 102/64  Pulse: (!) 56 65  Resp: 16 18  Temp: 98.1 F (36.7 C) 98.9 F (37.2 C)  SpO2: 97% 98%    History of present illness:  Charles Tyler is a 51 y.o. male with medical history significant for recent admission on 08/23/2017 at [facility] previously on hospitalist service during workup for chest pain and recurrent syncope found to have no significant obstructive coronary disease and no previous stents (though patient initially informed team that he had a history of stents).  Patient presented 10/19 with witnessed seizure-like activity.   At the time by wife.  While patient was lying down patient suddenly began to have shaky, jerky-like movements.  With each episode lasting 15 seconds followed by confusion.  These episodes continued 3 times before patient's wife called EMS.    Hospital Course:  Active Problems:   New onset seizure ([place])   Seizures ([place])  New onset seizure Infectious workup for possible etiology was negative with normal UA, no leukocytosis on CBC. No metabolic derangements (glucose normal)  Patient was evaluated by neurology during observation.  EEG was unremarkable.  MRI brain was unremarkable.  Given multiple episodes that were  witnessed neurology felt the need to continue Keppra (patient received a loading dose in the emergency department).  Patient was observed overnight and had no witnessed seizure-like activity while on Keppra.  Given family stressors, concerned seizures are more likely psychogenic in nature  Possible depression Patient reports high stress family life as he is currently undergoing a divorce from his wife of 54 years who lives now in [place].  Patient has a strained relationship with her as well as his children.  He admits that these new stressors preceded his newfound symptoms.  Patient reports being receptive to finding counseling.  Patient plans to use counselor/psychiatrist of his current girlfriend.  Patient denied any suicidal or homicidal ideation currently.  Of note, patient admits that he has had suicidal ideation with a plan in the distant past (during his teen years) Procedures: 10/19 EEG- Impression:  This is a normal EEG in the awake state.  This does not rule out underlying epilepsy.  If clinical suspicion remains high, then a more prolonged and/or sleep deprived EEG may be of additional value.  One event during the recording was non-epileptic in nature.  Clinical correlation is advised.    Consultations:  Neurology  Discharge Exam: BP 102/64 (BP Location: Left Arm)   Pulse 65   Temp 98.9 F (37.2 C) (Oral)   Resp 18   Ht 5\' 7"  (1.702 m)   Wt 71.8 kg (158 lb 4.6 oz)   SpO2 98%   BMI 24.79 kg/m   General: Sitting up in chair, in no apparent Cardiovascular: Regular rate and rhythm, no murmurs rubs or gallops Respiratory: Lungs clear to  auscultation bilaterally, no rales, no wheezing, no rhonchi Psych: Flat affect, denies suicidal ideation, good insight  Discharge Instructions You were cared for by a hospitalist during your hospital stay. If you have any questions about your discharge medications or the care you received while you were in the hospital after you are discharged,  you can call the unit and asked to speak with the hospitalist on call if the hospitalist that took care of you is not available. Once you are discharged, your primary care physician will handle any further medical issues. Please note that NO REFILLS for any discharge medications will be authorized once you are discharged, as it is imperative that you return to your primary care physician (or establish a relationship with a primary care physician if you do not have one) for your aftercare needs so that they can reassess your need for medications and monitor your lab values.  Discharge Instructions    Diet - low sodium heart healthy    Complete by:  As directed    Increase activity slowly    Complete by:  As directed      Allergies as of 08/26/2017   No Known Allergies     Medication List    TAKE these medications   aspirin EC 81 MG tablet Take 1 tablet (81 mg total) by mouth daily.   ibuprofen 200 MG tablet Commonly known as:  ADVIL,MOTRIN Take 200 mg by mouth every 6 (six) hours as needed for mild pain or moderate pain.   levETIRAcetam 500 MG tablet Commonly known as:  KEPPRA Take 1 tablet (500 mg total) by mouth 2 (two) times daily.      No Known Allergies    The results of significant diagnostics from this hospitalization (including imaging, microbiology, ancillary and laboratory) are listed below for reference.    Significant Diagnostic Studies: Dg Chest 2 View  Result Date: 08/22/2017 CLINICAL DATA:  Intermittent chest pain radiating to the arms and back, some shortness of breath, syncopal episode today EXAM: CHEST  2 VIEW COMPARISON:  None. FINDINGS: No active infiltrate or effusion is seen. Mediastinal and hilar contours are unremarkable. The heart is within normal limits in size. Old healed fracture involving the right sixth rib is noted posteriorly. IMPRESSION: No active cardiopulmonary disease. Electronically Signed   By: Ivar Drape M.D.   On: 08/22/2017 17:13   Ct  Head Wo Contrast  Result Date: 08/25/2017 CLINICAL DATA:  Possible seizure no hx of seizure EXAM: CT HEAD WITHOUT CONTRAST TECHNIQUE: Contiguous axial images were obtained from the base of the skull through the vertex without intravenous contrast. COMPARISON:  None. FINDINGS: Brain: No acute intracranial hemorrhage. No focal mass lesion. No CT evidence of acute infarction. No midline shift or mass effect. No hydrocephalus. Basilar cisterns are patent. Vascular: No hyperdense vessel or unexpected calcification. Skull: Normal. Negative for fracture or focal lesion. Sinuses/Orbits: Paranasal sinuses and mastoid air cells are clear. Orbits are clear. Other: None. IMPRESSION: Normal head CT. Electronically Signed   By: Suzy Bouchard M.D.   On: 08/25/2017 13:33   Mr Jeri Cos GB Contrast  Result Date: 08/25/2017 CLINICAL DATA:  New onset seizure nontraumatic EXAM: MRI HEAD WITHOUT AND WITH CONTRAST TECHNIQUE: Multiplanar, multiecho pulse sequences of the brain and surrounding structures were obtained without and with intravenous contrast. CONTRAST:  82mL MULTIHANCE GADOBENATE DIMEGLUMINE 529 MG/ML IV SOLN COMPARISON:  CT head 08/25/2017 FINDINGS: Brain: No acute infarction, hemorrhage, hydrocephalus, extra-axial collection or mass lesion. Vascular: Normal arterial flow  voids. Skull and upper cervical spine: Negative Sinuses/Orbits: Negative Other: None IMPRESSION: Negative MRI head with contrast Electronically Signed   By: Franchot Gallo M.D.   On: 08/25/2017 17:34   Ct Angio Chest/abd/pel For Dissection W And/or Wo Contrast  Result Date: 08/22/2017 CLINICAL DATA:  Chest pain EXAM: CT ANGIOGRAPHY CHEST, ABDOMEN AND PELVIS TECHNIQUE: Multidetector CT imaging through the chest, abdomen and pelvis was performed using the standard protocol during bolus administration of intravenous contrast. Multiplanar reconstructed images and MIPs were obtained and reviewed to evaluate the vascular anatomy. CONTRAST:  100 mL  Isovue 370 intravenous COMPARISON:  Radiograph 08/22/2017 FINDINGS: CTA CHEST FINDINGS Cardiovascular: Non contrasted images through the chest demonstrate no evidence for intramural hematoma. Nonaneurysmal aorta. No dissection is seen. Normal heart size. No significant pericardial effusion. Although not ideal protocol for assessment of pulmonary emboli, no discrete filling defects are seen within the central pulmonary artery branches. Mediastinum/Nodes: Midline trachea. No thyroid mass. No significant adenopathy. Esophagus within normal limits. Lungs/Pleura: Minimal apical emphysema. No pneumothorax, pleural effusion or acute pulmonary infiltrate. Musculoskeletal: No acute or suspicious bone lesion. Review of the MIP images confirms the above findings. CTA ABDOMEN AND PELVIS FINDINGS VASCULAR Aorta: Normal caliber aorta without aneurysm, dissection, vasculitis or significant stenosis. Celiac: Patent without evidence of aneurysm, dissection, vasculitis or significant stenosis. Replaced right hepatic artery from the SMA. SMA: Patent without evidence of aneurysm, dissection, vasculitis or significant stenosis. Renals: Both renal arteries are patent without evidence of aneurysm, dissection, vasculitis, fibromuscular dysplasia or significant stenosis. IMA: Patent without evidence of aneurysm, dissection, vasculitis or significant stenosis. Inflow: Patent without evidence of aneurysm, dissection, vasculitis or significant stenosis. Veins: Questionable filling defect at the portal splenic confluence with underfilling of the SMV. Review of the MIP images confirms the above findings. NON-VASCULAR Hepatobiliary: No focal liver abnormality is seen. Status post cholecystectomy. Mildly prominent extrahepatic bile duct likely due to post cholecystectomy change. Pancreas: Unremarkable. No pancreatic ductal dilatation or surrounding inflammatory changes. Spleen: Heterogeneous, likely due to arterial phase enhancement  Adrenals/Urinary Tract: Adrenal glands are unremarkable. Kidneys are normal, without renal calculi, focal lesion, or hydronephrosis. Bladder is unremarkable. Stomach/Bowel: Stomach is within normal limits. Appendix appears normal. No evidence of bowel wall thickening, distention, or inflammatory changes. Lymphatic: No significantly enlarged abdominal or pelvic lymph nodes. Reproductive: Prostate is unremarkable. Other: Negative for free air or free fluid. Small fat in the umbilicus. Musculoskeletal: Degenerative changes. No acute or suspicious finding. Review of the MIP images confirms the above findings. IMPRESSION: 1. Negative for aortic dissection or aortic aneurysm. 2. Mild apical emphysema.  Clear lung fields 3. Questionable filling defect at the portal splenic confluence with underfilling of the SMV ; suspect findings are related to arterial phase imaging, but cannot exclude nonocclusive thrombus. Could correlate with portal Doppler imaging to confirm patency of the portal, splenic, and superior mesenteric veins. 4. Minimal atherosclerotic calcification. No significant vascular disease of the major branch vessels in the abdomen Electronically Signed   By: Donavan Foil M.D.   On: 08/22/2017 19:58    Microbiology: No results found for this or any previous visit (from the past 240 hour(s)).   Labs: Basic Metabolic Panel:  Recent Labs Lab 08/22/17 1731 08/23/17 0311 08/23/17 1949 08/24/17 0244 08/25/17 0955  NA 139 139  --  137 138  K 3.3* 3.3*  --  3.6 3.8  CL 107 109  --  109 109  CO2 23 23  --  22 21*  GLUCOSE 105* 93  --  99 95  BUN 14 11  --  11 8  CREATININE 0.94 0.96 0.94 0.90 0.89  CALCIUM 8.9 8.6*  --  8.4* 8.9   Liver Function Tests: No results for input(s): AST, ALT, ALKPHOS, BILITOT, PROT, ALBUMIN in the last 168 hours. No results for input(s): LIPASE, AMYLASE in the last 168 hours. No results for input(s): AMMONIA in the last 168 hours. CBC:  Recent Labs Lab  08/22/17 1731 08/23/17 1949 08/25/17 0955  WBC 6.7 6.5 6.8  HGB 16.2 15.7 17.3*  HCT 45.2 44.9 48.4  MCV 90.2 90.2 89.1  PLT 150 138* 127*   Cardiac Enzymes:  Recent Labs Lab 08/22/17 1731 08/23/17 0004 08/23/17 0311  TROPONINI <0.03 <0.03 <0.03   BNP: BNP (last 3 results) No results for input(s): BNP in the last 8760 hours.  ProBNP (last 3 results) No results for input(s): PROBNP in the last 8760 hours.  CBG:  Recent Labs Lab 08/22/17 1657  GLUCAP 117*       Signed:  Desiree Hane, MD [facility] 08/26/2017, 12:39 PM

## 2017-08-26 NOTE — Progress Notes (Signed)
Discharge instructions given. Belongings taken by patient. Will continue to advance activity as tolerated.

## 2017-08-26 NOTE — Progress Notes (Addendum)
Neurology Progress Note   S:// Seen and examined.  No acute changes overnight.  No seizure activity overnight. Does not endorse depression or suicidal ideation.  O:// Current vital signs: BP (!) 105/58 (BP Location: Left Arm)   Pulse (!) 56   Temp 98.1 F (36.7 C) (Oral)   Resp 16   Ht 5\' 7"  (1.702 m)   Wt 71.8 kg (158 lb 4.6 oz)   SpO2 97%   BMI 24.79 kg/m  Vital signs in last 24 hours: Temp:  [98.1 F (36.7 C)-98.6 F (37 C)] 98.1 F (36.7 C) (10/20 0506) Pulse Rate:  [52-66] 56 (10/20 0506) Resp:  [12-20] 16 (10/20 0506) BP: (94-134)/(58-90) 105/58 (10/20 0506) SpO2:  [79 %-99 %] 97 % (10/20 0506) Weight:  [71.8 kg (158 lb 4.6 oz)] 71.8 kg (158 lb 4.6 oz) (10/19 1755) GENERAL: Awake, alert in NAD HEENT: - Normocephalic and atraumatic, dry mm, no LN++, no Thyromegally LUNGS - Clear to auscultation bilaterally with no wheezes CV - S1S2 RRR, no m/r/g, equal pulses bilaterally. ABDOMEN - Soft, nontender, nondistended with normoactive BS Ext: warm, well perfused, intact peripheral pulses,no edema. Multiple scars on the forearms NEURO:  Mental Status: AA&Ox3  Language: speech is clear.  Naming, repetition, fluency, and comprehension intact. Cranial Nerves: PERRL 24mm/brisk. EOMI, visual fields full, no facial asymmetry, facial sensation intact, hearing intact, tongue/uvula/soft palate midline, normal sternocleidomastoid and trapezius muscle strength. No evidence of tongue atrophy or fibrillations Motor: 5/5 all over Tone: is normal and bulk is normal Sensation- Intact to light touch bilaterally Coordination: FTN intact bilaterally, no ataxia in BLE. Gait- deferred  Medications Current Facility-Administered Medications:  .  acetaminophen (TYLENOL) tablet 650 mg, 650 mg, Oral, Q4H PRN, Schorr, Rhetta Mura, NP, 650 mg at 08/25/17 1944 .  aspirin EC tablet 81 mg, 81 mg, Oral, Daily, Nettey, Shayla D, MD .  enoxaparin (LOVENOX) injection 40 mg, 40 mg, Subcutaneous, Q24H,  Oretha Milch D, MD, 40 mg at 08/25/17 2117 .  levETIRAcetam (KEPPRA) tablet 500 mg, 500 mg, Oral, BID, Alphonzo Grieve, MD, 500 mg at 08/25/17 2117 .  ondansetron (ZOFRAN) tablet 4 mg, 4 mg, Oral, Q6H PRN **OR** ondansetron (ZOFRAN) injection 4 mg, 4 mg, Intravenous, Q6H PRN, Oretha Milch D, MD .  senna-docusate (Senokot-S) tablet 1 tablet, 1 tablet, Oral, QHS PRN, Oretha Milch D, MD .  sodium chloride flush (NS) 0.9 % injection 3 mL, 3 mL, Intravenous, Q12H, Oretha Milch D, MD, 3 mL at 08/25/17 2124   Labs CBC    Component Value Date/Time   WBC 6.8 08/25/2017 0955   RBC 5.43 08/25/2017 0955   HGB 17.3 (H) 08/25/2017 0955   HCT 48.4 08/25/2017 0955   PLT 127 (L) 08/25/2017 0955   MCV 89.1 08/25/2017 0955   MCH 31.9 08/25/2017 0955   MCHC 35.7 08/25/2017 0955   RDW 12.8 08/25/2017 0955  CMP     Component Value Date/Time   NA 138 08/25/2017 0955   K 3.8 08/25/2017 0955   CL 109 08/25/2017 0955   CO2 21 (L) 08/25/2017 0955   GLUCOSE 95 08/25/2017 0955   BUN 8 08/25/2017 0955   CREATININE 0.89 08/25/2017 0955   CALCIUM 8.9 08/25/2017 0955   GFRNONAA >60 08/25/2017 0955   GFRAA >60 08/25/2017 0955   Lipid Panel     Component Value Date/Time   CHOL 182 08/24/2017 0244   TRIG 94 08/24/2017 0244   HDL 30 (L) 08/24/2017 0244   CHOLHDL 6.1 08/24/2017 0244   VLDL  19 08/24/2017 0244   LDLCALC 133 (H) 08/24/2017 0244   Imaging I have reviewed images in epic and the results pertinent to this consultation are: CT-scan of the brain -no acute changes MRI examination of the brain -with and without contrast-normal  EEG- normal  Assessment:  51 year old man with a significant history of depression not on any treatment, presented for evaluation of multiple seizures that happened yesterday. Given his history, and the initial exam, I was suspicious for underlying psychogenic component. He did receive an MRI and EEG both of which are normal. Because of the multiplicity of  events, he was started on Keppra.  Impression: Seizures  Recommendations: At this time, because of the fact that he had multiple events with prolonged postictal state, I would recommend continuing Keppra 500 mg twice daily. Outpatient follow-up with neurology in 4-6 weeks.  Consideration should be given to perform a long-term EEG to capture any clinical events and look for any electrographic correlates. Seizure precautions are detailed in the initial consultation note including restrictions on driving, using heavy machinery etc.  These were discussed in detail with the patient and his wife at bedside yesterday, no family at bedside today. I also recommended that the patient follow-up with the outpatient therapist/psychiatrist for his depression. All questions were answered.  Please call inpatient neurology with questions.  Amie Portland, MD [facility] [phone] If 7pm to 7am, please call on call as listed on AMION.

## 2017-08-29 ENCOUNTER — Ambulatory Visit (INDEPENDENT_AMBULATORY_CARE_PROVIDER_SITE_OTHER): Payer: 59

## 2017-08-29 DIAGNOSIS — R55 Syncope and collapse: Secondary | ICD-10-CM | POA: Diagnosis not present

## 2017-08-31 ENCOUNTER — Emergency Department (HOSPITAL_COMMUNITY)
Admission: EM | Admit: 2017-08-31 | Discharge: 2017-09-01 | Disposition: A | Discharge status: Home / Self Care | Payer: 59 | Attending: Emergency Medicine | Admitting: Emergency Medicine

## 2017-08-31 ENCOUNTER — Other Ambulatory Visit: Payer: Self-pay

## 2017-08-31 ENCOUNTER — Encounter (HOSPITAL_COMMUNITY): Payer: Self-pay

## 2017-08-31 DIAGNOSIS — E785 Hyperlipidemia, unspecified: Secondary | ICD-10-CM | POA: Insufficient documentation

## 2017-08-31 DIAGNOSIS — Z87891 Personal history of nicotine dependence: Secondary | ICD-10-CM | POA: Diagnosis not present

## 2017-08-31 DIAGNOSIS — R569 Unspecified convulsions: Secondary | ICD-10-CM | POA: Insufficient documentation

## 2017-09-01 ENCOUNTER — Emergency Department (HOSPITAL_COMMUNITY): Payer: 59

## 2017-09-01 LAB — COMPREHENSIVE METABOLIC PANEL
ALBUMIN: 4.3 g/dL (ref 3.5–5.0)
ALK PHOS: 51 U/L (ref 38–126)
ALT: 28 U/L (ref 17–63)
AST: 20 U/L (ref 15–41)
Anion gap: 7 (ref 5–15)
BILIRUBIN TOTAL: 0.6 mg/dL (ref 0.3–1.2)
BUN: 17 mg/dL (ref 6–20)
CALCIUM: 9.1 mg/dL (ref 8.9–10.3)
CO2: 27 mmol/L (ref 22–32)
Chloride: 105 mmol/L (ref 101–111)
Creatinine, Ser: 0.98 mg/dL (ref 0.61–1.24)
GFR calc Af Amer: >60 mL/min (ref >60)
GFR calc non Af Amer: >60 mL/min (ref >60)
GLUCOSE: 78 mg/dL (ref 65–99)
POTASSIUM: 3.6 mmol/L (ref 3.5–5.1)
Sodium: 139 mmol/L (ref 135–145)
TOTAL PROTEIN: 6.7 g/dL (ref 6.5–8.1)

## 2017-09-01 LAB — CBC WITH DIFFERENTIAL/PLATELET
BASOS ABS: 0 10*3/uL (ref 0.0–0.1)
BASOS PCT: 0 %
EOS PCT: 6 %
Eosinophils Absolute: 0.5 10*3/uL (ref 0.0–0.7)
HCT: 44.5 % (ref 39.0–52.0)
Hemoglobin: 16 g/dL (ref 13.0–17.0)
LYMPHS PCT: 24 %
Lymphs Abs: 2 10*3/uL (ref 0.7–4.0)
MCH: 32.1 pg (ref 26.0–34.0)
MCHC: 36 g/dL (ref 30.0–36.0)
MCV: 89.4 fL (ref 78.0–100.0)
MONO ABS: 0.8 10*3/uL (ref 0.1–1.0)
Monocytes Relative: 9 %
NEUTROS ABS: 5.2 10*3/uL (ref 1.7–7.7)
Neutrophils Relative %: 61 %
PLATELETS: 141 10*3/uL — AB (ref 150–400)
RBC: 4.98 MIL/uL (ref 4.22–5.81)
RDW: 12.8 % (ref 11.5–15.5)
WBC: 8.5 10*3/uL (ref 4.0–10.5)

## 2017-09-01 LAB — RAPID URINE DRUG SCREEN, HOSP PERFORMED
AMPHETAMINES: NOT DETECTED
Barbiturates: NOT DETECTED
Benzodiazepines: NOT DETECTED
Cocaine: NOT DETECTED
Opiates: NOT DETECTED
Tetrahydrocannabinol: NOT DETECTED

## 2017-09-01 LAB — TROPONIN I

## 2017-09-01 MED ORDER — NAPROXEN 500 MG PO TABS: ORAL_TABLET | ORAL | 0 refills | Status: DC

## 2017-09-01 MED ORDER — LEVETIRACETAM 500 MG/5ML IV SOLN
500.0000 mg | Freq: Once | INTRAVENOUS | Status: DC
  Filled 2017-09-01: qty 5

## 2017-09-01 MED ORDER — METHOCARBAMOL 500 MG PO TABS: ORAL_TABLET | ORAL | 0 refills | Status: DC

## 2017-09-01 MED ORDER — LEVETIRACETAM 500 MG PO TABS
500.0000 mg | ORAL_TABLET | Freq: Once | ORAL | Status: AC
  Administered 2017-09-01: 500 mg via ORAL
  Filled 2017-09-01: qty 1

## 2017-09-01 MED ORDER — LEVETIRACETAM 1000 MG PO TABS: 1000.0000 mg | ORAL_TABLET | Freq: Two times a day (BID) | ORAL | 0 refills | Status: DC

## 2017-09-01 NOTE — ED Provider Notes (Signed)
[facility] EMERGENCY DEPARTMENT Provider Note   CSN: [id number] Arrival date & time: 08/31/17  2333     History   Chief Complaint Chief Complaint  Patient presents with  . Seizures    HPI Charles Tyler is a 51 y.o. male.  The history is provided by the patient. No language interpreter was used.  Seizures   This is a new problem. The current episode started less than 1 hour ago. The problem has not changed since onset.There were 6 to 10 seizures. The most recent episode lasted 2 to 5 minutes. Associated symptoms include confusion. Characteristics include eye blinking, rhythmic jerking and loss of consciousness. The episode was witnessed. The seizures did not continue in the ED. The seizure(s) had no focality. There has been no fever. Meds prior to arrival: keppra.  Pt is on keppra for seizures.  Pt's family reports pt has been having jerking episodes every night.  Pt is reported to have had 6 episodes tonight with jerking lasting about 2 minutes each.  Pt complains of pain in his neck, shoulder and upper back from hitting the floor.   Past Medical History:  Diagnosis Date  . History of cardiac catheterization    Done in [place] - details not clear  . Hyperlipidemia     Patient Active Problem List   Diagnosis Date Noted  . New onset seizure ([place]) 08/25/2017  . Seizures ([place]) 08/25/2017  . Chest pain 08/22/2017  . History of coronary artery stent placement 08/22/2017  . Hyperlipidemia 08/22/2017  . Back pain 08/22/2017  . Syncope 08/22/2017    Past Surgical History:  Procedure Laterality Date  . CHOLECYSTECTOMY    . LEFT HEART CATH AND CORONARY ANGIOGRAPHY N/A 08/23/2017   Procedure: LEFT HEART CATH AND CORONARY ANGIOGRAPHY;  Surgeon: Belva Crome, MD;  Location: [facility];  Service: Cardiovascular;  Laterality: N/A;       Home Medications    Prior to Admission medications   Medication Sig Start Date End Date Taking? Authorizing Provider    aspirin EC 81 MG tablet Take 1 tablet (81 mg total) by mouth daily. 08/24/17   Cheryln Manly, NP  ibuprofen (ADVIL,MOTRIN) 200 MG tablet Take 200 mg by mouth every 6 (six) hours as needed for mild pain or moderate pain.    [provider]  levETIRAcetam (KEPPRA) 500 MG tablet Take 1 tablet (500 mg total) by mouth 2 (two) times daily. 08/26/17   Desiree Hane, MD    Family History Family History  Problem Relation Age of Onset  . CAD Father        Premature CAD status post CABG  . CAD Paternal Grandfather        Premature CAD status post CABG    Social History Social History  Substance Use Topics  . Smoking status: Former Research scientist (life sciences)  . Smokeless tobacco: Never Used  . Alcohol use Yes     Comment: rarely      Allergies   Patient has no known allergies.   Review of Systems Review of Systems  Neurological: Positive for seizures and loss of consciousness.  Psychiatric/Behavioral: Positive for confusion.  All other systems reviewed and are negative.    Physical Exam Updated Vital Signs BP 125/84 (BP Location: Right Arm)   Pulse 67   Temp 98.7 F (37.1 C) (Oral)   Resp 16   SpO2 97%   Physical Exam  Constitutional: He appears well-developed and well-nourished.  HENT:  Head: Normocephalic and  atraumatic.  Eyes: Conjunctivae are normal.  Neck: Neck supple.  Tender cervical spine, tender thoracic spine,  Pain with moving right shoulder  Cardiovascular: Normal rate and regular rhythm.   No murmur heard. Pulmonary/Chest: Effort normal and breath sounds normal. No respiratory distress.  Abdominal: Soft. There is no tenderness.  Musculoskeletal: He exhibits no edema.  Neurological: He is alert.  Skin: Skin is warm and dry.  Psychiatric: He has a normal mood and affect.  Nursing note and vitals reviewed.    ED Treatments / Results  Labs (all labs ordered are listed, but only abnormal results are displayed) Labs Reviewed  CBC WITH  DIFFERENTIAL/PLATELET - Abnormal; Notable for the following:       Result Value   Platelets 141 (*)    All other components within normal limits  TROPONIN I  COMPREHENSIVE METABOLIC PANEL  RAPID URINE DRUG SCREEN, HOSP PERFORMED    EKG  EKG Interpretation None      I spoke to Dr. Lorraine Lax who advised observation in the ED. If pt has further events he advised admit to Hospitalist with neurology consult. (at [facility])  He advised give 500mg  of keppra.  If pt does not have any further episodes increase keppra to 1000  Radiology No results found.  Procedures Procedures (including critical care time)  Medications Ordered in ED Medications - No data to display   Initial Impression / Assessment and Plan / ED Course  I have reviewed the triage vital signs and the nursing notes.  Pertinent labs & imaging results that were available during my care of the patient were reviewed by me and considered in my medical decision making (see chart for details).     Pt's care turned over to Dr. Tomi Bamberger 1:45 am.   Final Clinical Impressions(s) / ED Diagnoses   Final diagnoses:  None    New Prescriptions New Prescriptions   No medications on file     Sidney Ace 09/01/17 0150    Rolland Porter, MD 09/01/17 0330

## 2017-09-01 NOTE — Discharge Instructions (Signed)
Increase your keppra to 1000 mg twice a day, take 2 of the 500 mg tabs twice a day and when gone start the 1000 mg tablet twice a day. Follow up with your primary care doctor to get the Neurology referral. NO DRIVING until you are told by the neurologist that you can drive. Use ice on the sore muscles. Take the naproxen and robaxin for sore muscles.

## 2017-09-01 NOTE — ED Notes (Signed)
Pt given urinal.

## 2017-09-10 ENCOUNTER — Observation Stay (HOSPITAL_COMMUNITY)
Admission: EM | Admit: 2017-09-10 | Discharge: 2017-09-13 | Disposition: A | Discharge status: Home / Self Care | Payer: 59 | Attending: Internal Medicine | Admitting: Internal Medicine

## 2017-09-10 ENCOUNTER — Observation Stay (HOSPITAL_COMMUNITY): Payer: 59

## 2017-09-10 ENCOUNTER — Encounter (HOSPITAL_COMMUNITY): Payer: Self-pay | Admitting: Emergency Medicine

## 2017-09-10 ENCOUNTER — Other Ambulatory Visit: Payer: Self-pay

## 2017-09-10 DIAGNOSIS — R569 Unspecified convulsions: Secondary | ICD-10-CM | POA: Diagnosis not present

## 2017-09-10 DIAGNOSIS — Z87891 Personal history of nicotine dependence: Secondary | ICD-10-CM | POA: Insufficient documentation

## 2017-09-10 DIAGNOSIS — R52 Pain, unspecified: Secondary | ICD-10-CM | POA: Diagnosis not present

## 2017-09-10 DIAGNOSIS — R079 Chest pain, unspecified: Secondary | ICD-10-CM

## 2017-09-10 DIAGNOSIS — Z7982 Long term (current) use of aspirin: Secondary | ICD-10-CM | POA: Insufficient documentation

## 2017-09-10 DIAGNOSIS — Z955 Presence of coronary angioplasty implant and graft: Secondary | ICD-10-CM | POA: Insufficient documentation

## 2017-09-10 DIAGNOSIS — R634 Abnormal weight loss: Secondary | ICD-10-CM

## 2017-09-10 LAB — CBC WITH DIFFERENTIAL/PLATELET
BASOS ABS: 0 10*3/uL (ref 0.0–0.1)
Basophils Relative: 0 %
EOS PCT: 7 %
Eosinophils Absolute: 0.4 10*3/uL (ref 0.0–0.7)
HCT: 44.9 % (ref 39.0–52.0)
Hemoglobin: 15.8 g/dL (ref 13.0–17.0)
LYMPHS PCT: 22 %
Lymphs Abs: 1.4 10*3/uL (ref 0.7–4.0)
MCH: 31.4 pg (ref 26.0–34.0)
MCHC: 35.2 g/dL (ref 30.0–36.0)
MCV: 89.3 fL (ref 78.0–100.0)
Monocytes Absolute: 0.4 10*3/uL (ref 0.1–1.0)
Monocytes Relative: 6 %
NEUTROS ABS: 4.2 10*3/uL (ref 1.7–7.7)
NEUTROS PCT: 65 %
PLATELETS: 145 10*3/uL — AB (ref 150–400)
RBC: 5.03 MIL/uL (ref 4.22–5.81)
RDW: 13 % (ref 11.5–15.5)
WBC: 6.4 10*3/uL (ref 4.0–10.5)

## 2017-09-10 LAB — COMPREHENSIVE METABOLIC PANEL
ALBUMIN: 3.8 g/dL (ref 3.5–5.0)
ALT: 18 U/L (ref 17–63)
ANION GAP: 5 (ref 5–15)
AST: 17 U/L (ref 15–41)
Alkaline Phosphatase: 47 U/L (ref 38–126)
BUN: 14 mg/dL (ref 6–20)
CHLORIDE: 109 mmol/L (ref 101–111)
CO2: 25 mmol/L (ref 22–32)
Calcium: 8.6 mg/dL — ABNORMAL LOW (ref 8.9–10.3)
Creatinine, Ser: 0.78 mg/dL (ref 0.61–1.24)
GFR calc Af Amer: >60 mL/min (ref >60)
Glucose, Bld: 88 mg/dL (ref 65–99)
POTASSIUM: 3.7 mmol/L (ref 3.5–5.1)
Sodium: 139 mmol/L (ref 135–145)
TOTAL PROTEIN: 5.8 g/dL — AB (ref 6.5–8.1)
Total Bilirubin: 0.9 mg/dL (ref 0.3–1.2)

## 2017-09-10 LAB — TSH: TSH: 1.94 u[IU]/mL (ref 0.350–4.500)

## 2017-09-10 LAB — ETHANOL

## 2017-09-10 MED ORDER — SODIUM CHLORIDE 0.9 % IV SOLN
1000.0000 mg | Freq: Two times a day (BID) | INTRAVENOUS | Status: DC
  Administered 2017-09-10 – 2017-09-13 (×6): 1000 mg via INTRAVENOUS
  Filled 2017-09-10 (×7): qty 10

## 2017-09-10 MED ORDER — ASPIRIN EC 81 MG PO TBEC
81.0000 mg | DELAYED_RELEASE_TABLET | Freq: Every day | ORAL | Status: DC
  Administered 2017-09-10 – 2017-09-13 (×4): 81 mg via ORAL
  Filled 2017-09-10 (×4): qty 1

## 2017-09-10 MED ORDER — SODIUM CHLORIDE 0.9 % IV BOLUS (SEPSIS)
1000.0000 mL | Freq: Once | INTRAVENOUS | Status: AC
  Administered 2017-09-10: 1000 mL via INTRAVENOUS

## 2017-09-10 MED ORDER — ENOXAPARIN SODIUM 40 MG/0.4ML ~~LOC~~ SOLN
40.0000 mg | SUBCUTANEOUS | Status: DC
Start: 2017-09-10 — End: 2017-09-13
  Administered 2017-09-10 – 2017-09-12 (×3): 40 mg via SUBCUTANEOUS
  Filled 2017-09-10 (×3): qty 0.4

## 2017-09-10 MED ORDER — LORAZEPAM 2 MG/ML IJ SOLN
INTRAMUSCULAR | Status: AC
  Filled 2017-09-10: qty 1

## 2017-09-10 MED ORDER — HYDROCODONE-ACETAMINOPHEN 5-325 MG PO TABS
1.0000 | ORAL_TABLET | Freq: Four times a day (QID) | ORAL | Status: DC | PRN
  Administered 2017-09-11 – 2017-09-13 (×3): 1 via ORAL
  Filled 2017-09-10 (×3): qty 1

## 2017-09-10 NOTE — ED Provider Notes (Addendum)
[facility] EMERGENCY DEPARTMENT Provider Note   CSN: [id number] Arrival date & time: 09/10/17  1436     History   Chief Complaint Chief Complaint  Patient presents with  . Seizures    HPI Charles Tyler is a 51 y.o. male.  Level 5 caveat for seizure disorder.  Most of history obtained from girlfriend.  Patient has been having greater than 10 episodes per day of generalized shaking that has resolved spontaneously.  He is slightly confused afterward.  He was admitted to the hospital in mid October for similar symptoms.  An MRI of the brain and awake EEG were both read as negative.  He was started on Keppra 1000 mg twice a day.  No obvious known history of remote seizure disorder.  He was given Versed 2.5 mg by EMS.  No known street drugs or alcohol.      Past Medical History:  Diagnosis Date  . History of cardiac catheterization    Done in [place] - details not clear  . Hyperlipidemia     Patient Active Problem List   Diagnosis Date Noted  . New onset seizure ([place]) 08/25/2017  . Seizures ([place]) 08/25/2017  . Chest pain 08/22/2017  . History of coronary artery stent placement 08/22/2017  . Hyperlipidemia 08/22/2017  . Back pain 08/22/2017  . Syncope 08/22/2017    Past Surgical History:  Procedure Laterality Date  . CHOLECYSTECTOMY         Home Medications    Prior to Admission medications   Medication Sig Start Date End Date Taking? Authorizing Provider  aspirin EC 81 MG tablet Take 1 tablet (81 mg total) by mouth daily. 08/24/17   Cheryln Manly, NP  ibuprofen (ADVIL,MOTRIN) 200 MG tablet Take 200 mg by mouth every 6 (six) hours as needed for mild pain or moderate pain.    [provider]  levETIRAcetam (KEPPRA) 1000 MG tablet Take 1 tablet (1,000 mg total) by mouth 2 (two) times daily. 09/01/17   Rolland Porter, MD  levETIRAcetam (KEPPRA) 500 MG tablet Take 1 tablet (500 mg total) by mouth 2 (two) times daily. 08/26/17   Desiree Hane, MD  methocarbamol (ROBAXIN) 500 MG tablet Take 1 or 2 po Q 6hrs for muscle pain 09/01/17   Rolland Porter, MD  naproxen (NAPROSYN) 500 MG tablet Take 1 po BID with food prn pain 09/01/17   Rolland Porter, MD    Family History Family History  Problem Relation Age of Onset  . CAD Father        Premature CAD status post CABG  . CAD Paternal Grandfather        Premature CAD status post CABG    Social History Social History   Tobacco Use  . Smoking status: Former Research scientist (life sciences)  . Smokeless tobacco: Never Used  Substance Use Topics  . Alcohol use: Yes    Comment: rarely   . Drug use: No     Allergies   Patient has no known allergies.   Review of Systems Review of Systems  Unable to perform ROS: Acuity of condition     Physical Exam Updated Vital Signs BP 132/84   Pulse 60   Temp 98.8 F (37.1 C) (Oral)   Resp 16   Ht 5\' 7"  (1.702 m)   Wt 71.7 kg (158 lb)   SpO2 98%   BMI 24.75 kg/m   Physical Exam  Constitutional:  Sleepy, arousable  HENT:  Head: Normocephalic and atraumatic.  Eyes:  Conjunctivae are normal.  Neck: Neck supple.  Cardiovascular: Normal rate and regular rhythm.  Pulmonary/Chest: Effort normal and breath sounds normal.  Abdominal: Soft. Bowel sounds are normal.  Musculoskeletal:  Will move all 4 extremities  Neurological: He is alert.  Skin: Skin is warm and dry.  Psychiatric:  Flat affect  Nursing note and vitals reviewed.    ED Treatments / Results  Labs (all labs ordered are listed, but only abnormal results are displayed) Labs Reviewed  CBC WITH DIFFERENTIAL/PLATELET - Abnormal; Notable for the following components:      Result Value   Platelets 145 (*)    All other components within normal limits  COMPREHENSIVE METABOLIC PANEL - Abnormal; Notable for the following components:   Calcium 8.6 (*)    Total Protein 5.8 (*)    All other components within normal limits  ETHANOL  RAPID URINE DRUG SCREEN, HOSP PERFORMED    EKG  EKG  Interpretation None       Radiology No results found.  Procedures Procedures (including critical care time)  Medications Ordered in ED Medications  LORazepam (ATIVAN) 2 MG/ML injection (not administered)  sodium chloride 0.9 % bolus 1,000 mL (0 mLs Intravenous Stopped 09/10/17 1659)     Initial Impression / Assessment and Plan / ED Course  I have reviewed the triage vital signs and the nursing notes.  Pertinent labs & imaging results that were available during my care of the patient were reviewed by me and considered in my medical decision making (see chart for details).    Patient has had multiple episodes of seizure-like activity.  He does not have a true postictal phase to these events.  2 of these episodes were witnessed in the emergency department that lasted approximately 1 minute.  He exhibited early shaking that resolved spontaneously.  Discussed with a neurologist.  Admit to general medicine.    CRITICAL CARE Performed by: Nat Christen  ?  Total critical care time: 30 minutes  Critical care time was exclusive of separately billable procedures and treating other patients.  Critical care was necessary to treat or prevent imminent or life-threatening deterioration.  Critical care was time spent personally by me on the following activities: development of treatment plan with patient and/or surrogate as well as nursing, discussions with consultants, evaluation of patient's response to treatment, examination of patient, obtaining history from patient or surrogate, ordering and performing treatments and interventions, ordering and review of laboratory studies, ordering and review of radiographic studies, pulse oximetry and re-evaluation of patient's condition.  Final Clinical Impressions(s) / ED Diagnoses   Final diagnoses:  Seizure-like activity [facility])    New Prescriptions This SmartLink is deprecated. Use AVSMEDLIST instead to display the medication list for a  patient.   Nat Christen, MD 09/10/17 1715    Nat Christen, MD 09/12/17 [phone]

## 2017-09-10 NOTE — Progress Notes (Signed)
Received from ED via stretcher; oriented to room and unit routine; patient alert and oriented;without acute distress.

## 2017-09-10 NOTE — ED Triage Notes (Addendum)
Received pt from home with c/o having seizures for past 2-3 weeks. Today wife came home to find pt out in yard unresponsive. Wife was able to get pt to respond and into the house. Pt then began to have multiple seizures witnessed by wife. Pt given 600 ml of NS and 2.5 mg of versed by EMS. Pt has been postictal with EMS. Pt arousable in ED with confusion. No incontinence noted, no oral injury.

## 2017-09-10 NOTE — H&P (Signed)
History and Physical    Charles Tyler QPY:[id number] DOB: 10/29/1966 DOA: 09/10/2017  I have briefly reviewed the patient's prior medical records in [facility]  PCP: No primary care provider on file.  Patient coming from: Home  Chief Complaint: Multiple seizures today  HPI: Charles Tyler is a 51 y.o. male with medical history significant of chest pain for which she was evaluated here last month with a negative cardiac catheterization, discharge and hospitalized the next day with seizure episodes at home, he was seen by neurology during that hospitalization and was placed on Keppra.  Patient comes to the hospital being brought by the family today due to recurrent back-to-back seizures throughout the day.  Family was at bedside thinks that he may have had around 6 seizure episodes today.  Patient tells me that he remembers working in the yard, feeling funny, walking to his couch and the next thing he knows he is in the emergency room.  Significant other is at bedside and tells me that patient has been having multiple episodes of being "contracted", unresponsive, for a few minutes followed by episodes of confusion.  She reports that the patient had no incontinence, has not lost urine/stool, and no tongue biting.  Patient has no recollection of these episodes.  He denies any recent fever or chills, he has been having intermittent chest pains for the past few weeks, he denies any shortness of breath.  He has been complaining of a 10 pound weight loss over the past 1/2 weeks.  He has no lightheadedness or dizzy numbness and up until today's episodes he was feeling back to his normal self.  Significant other also tells me that occasionally when he sleeps he has significant muscle twitching, appears confused and somewhat aggressive.  Patient denies any alcohol or other substance use.  He reports being depressed and under a lot of stressors at home.  Previously mentioned when he was hospitalized  was that he is going through a divorce.  ED Course: Patient was brought by EMS, was given Versed and was found to be postictal when he first arrived into the ED.  On my evaluation patient is alert and oriented x4.  His vital signs are stable, his blood pressure is normal and he satting well on room air.  His blood work is essentially normal except for borderline decreased platelets of 145.  Neurology was consulted by EDP and we were asked to admit  Review of Systems: As per HPI otherwise 10 point review of systems negative.   Past Medical History:  Diagnosis Date  . History of cardiac catheterization    Done in [place] - details not clear  . Hyperlipidemia     Past Surgical History:  Procedure Laterality Date  . CHOLECYSTECTOMY       reports that he has quit smoking. he has never used smokeless tobacco. He reports that he drinks alcohol. He reports that he does not use drugs.  No Known Allergies  Family History  Problem Relation Age of Onset  . CAD Father        Premature CAD status post CABG  . CAD Paternal Grandfather        Premature CAD status post CABG    Prior to Admission medications   Medication Sig Start Date End Date Taking? Authorizing Provider  aspirin EC 81 MG tablet Take 1 tablet (81 mg total) by mouth daily. 08/24/17   Cheryln Manly, NP  ibuprofen (ADVIL,MOTRIN) 200 MG tablet Take 200 mg  by mouth every 6 (six) hours as needed for mild pain or moderate pain.    [provider]  levETIRAcetam (KEPPRA) 1000 MG tablet Take 1 tablet (1,000 mg total) by mouth 2 (two) times daily. 09/01/17   Rolland Porter, MD  levETIRAcetam (KEPPRA) 500 MG tablet Take 1 tablet (500 mg total) by mouth 2 (two) times daily. 08/26/17   Desiree Hane, MD  methocarbamol (ROBAXIN) 500 MG tablet Take 1 or 2 po Q 6hrs for muscle pain 09/01/17   Rolland Porter, MD  naproxen (NAPROSYN) 500 MG tablet Take 1 po BID with food prn pain 09/01/17   Rolland Porter, MD    Physical Exam: Vitals:     09/10/17 1530 09/10/17 1600 09/10/17 1630 09/10/17 1700  BP: 111/72 116/78 132/84 (!) 124/92  Pulse: (!) 58 (!) 59 60 (!) 57  Resp: 17 12 16 18   Temp:      TempSrc:      SpO2: 95% 99% 98% 98%  Weight:      Height:        Constitutional: NAD, calm, comfortable, cooperative Eyes: lids and conjunctivae normal ENMT: Mucous membranes are moist. Neck: normal, supple Respiratory: clear to auscultation bilaterally, no wheezing, no crackles. Normal respiratory effort. No accessory muscle use.  Cardiovascular: Regular rate and rhythm, no murmurs / rubs / gallops. No extremity edema. 2+ pedal pulses.  Abdomen: no tenderness, no masses palpated. Bowel sounds positive.  Musculoskeletal: no clubbing / cyanosis. Normal muscle tone.  Skin: no rashes, lesions, ulcers. No induration Neurologic: CN 2-12 grossly intact. Strength 5/5 in all 4.  Psychiatric: Normal judgment and insight. Alert and oriented x 3. Normal mood.   Labs on Admission: I have personally reviewed following labs and imaging studies  CBC: Recent Labs  Lab 09/10/17 1441  WBC 6.4  NEUTROABS 4.2  HGB 15.8  HCT 44.9  MCV 89.3  PLT 762*   Basic Metabolic Panel: Recent Labs  Lab 09/10/17 1441  NA 139  K 3.7  CL 109  CO2 25  GLUCOSE 88  BUN 14  CREATININE 0.78  CALCIUM 8.6*   GFR: Estimated Creatinine Clearance: 102.1 mL/min (by C-G formula based on SCr of 0.78 mg/dL). Liver Function Tests: Recent Labs  Lab 09/10/17 1441  AST 17  ALT 18  ALKPHOS 47  BILITOT 0.9  PROT 5.8*  ALBUMIN 3.8   No results for input(s): LIPASE, AMYLASE in the last 168 hours. No results for input(s): AMMONIA in the last 168 hours. Coagulation Profile: No results for input(s): INR, PROTIME in the last 168 hours. Cardiac Enzymes: No results for input(s): CKTOTAL, CKMB, CKMBINDEX, TROPONINI in the last 168 hours. BNP (last 3 results) No results for input(s): PROBNP in the last 8760 hours. HbA1C: No results for input(s): HGBA1C in  the last 72 hours. CBG: No results for input(s): GLUCAP in the last 168 hours. Lipid Profile: No results for input(s): CHOL, HDL, LDLCALC, TRIG, CHOLHDL, LDLDIRECT in the last 72 hours. Thyroid Function Tests: No results for input(s): TSH, T4TOTAL, FREET4, T3FREE, THYROIDAB in the last 72 hours. Anemia Panel: No results for input(s): VITAMINB12, FOLATE, FERRITIN, TIBC, IRON, RETICCTPCT in the last 72 hours. Urine analysis:    Component Value Date/Time   COLORURINE YELLOW 08/22/2017 [place] 08/22/2017 1656   LABSPEC 1.017 08/22/2017 1656   PHURINE 6.0 08/22/2017 1656   GLUCOSEU NEGATIVE 08/22/2017 [place] NEGATIVE 08/22/2017 [place] 08/22/2017 1656   KETONESUR NEGATIVE 08/22/2017 1656  PROTEINUR NEGATIVE 08/22/2017 1656   NITRITE NEGATIVE 08/22/2017 [place] 08/22/2017 1656     Radiological Exams on Admission: No results found.  EKG: Independently reviewed.   Assessment/Plan Active Problems:   Chest pain   Seizure (HCC)   Weight loss   Seizure episodes -Patient was admitted with history of the same just couple weeks ago and placed on Keppra.  He reports compliance with the medications.  There were discussions about needing continuous monitoring, defer to neurology whether continuous EEG is indicated in his case.  Loaded with 1 g of Keppra IV now, continue 1 g every 12 hours.  Appreciate neurology expertise -EEG done during his prior hospital stay 08/25/2017 was normal, however does mention of "One event during the recording was non-epileptic in nature".  Unclear clinical significance  Chest pain, intermittent, atypical -He was fully evaluated by cardiology in October 2018.  Underwent a 2D echo on 08/23/2017 which showed normal EF of 60-65% and normal diastolic function. -He also underwent a cardiac catheterization on 08/23/2017 which showed mid LAD lesion, 25% stenosed, as well as Ost 1st Diag lesion, 50  %stenosed.  Overall there were no significant obstructive coronary disease. -He also underwent a CT angiogram on 08/22/2017 which was negative for acute findings, however there was a questionable filling defect of the portal splenic confluence with underfilling of the SMV, and this finding cannot exclude a nonocclusive thrombus -We will order portal Doppler imaging to evaluate portal, splenic and SMV.  Of note, his LFTs are normal.  Weight loss -Unclear etiology, 10 pounds over the last 2 weeks, he has significant depression and may be related to that.   DVT prophylaxis: Lovenox  Code Status: Full code  Family Communication: family at bedside Disposition Plan: Admit to telemetry, home when ready Consults called: Neurology    Admission status: Observation  At the point of initial evaluation, it is my clinical opinion that admission for OBSERVATION is reasonable and necessary because the patient's presenting complaints in the context of their chronic conditions represent sufficient risk of deterioration or significant morbidity to constitute reasonable grounds for close observation in the hospital setting, but that the patient may be medically stable for discharge from the hospital within 24 to 48 hours.     Marzetta Board, MD [facility] Pager [phone]  If 7PM-7AM, please contact night-coverage [email or web address] Password [facility]  09/10/2017, 5:31 PM

## 2017-09-10 NOTE — Consult Note (Signed)
Neurology Consultation Reason for Consult: Seizures Referring Physician: Renne Crigler, C  CC: Seizures  History is obtained from: Patient, fianc  HPI: Charles Tyler is a 51 y.o. male who began having seizures in mid October.  He was started on Keppra, which has been increased to a gram twice daily.  He states he has about 3-4, may be even 5-6 episodes per day.  He is unaware of them.  I called his fiance he describes them as shaking episodes.  She states that his eyes are either opened or closed, when they are open they are glassy and staring straight ahead.  She notes jerking, predominantly around the shoulders.  She is not sure if it starts on one side versus the other.  She denies fecal or urinary incontinence with these episodes.  He was having multiple in a row today, and therefore 9 1 moves: He was transported to the ER.  Of note, he had some type of episode during his EEG previously, though I am not sure if it is his typical episode.  This was nonepileptic.  He also describes intermittent numbness in his right hand, this is been going on for much longer than the seizures.  He states that occasionally he will have to wake up in the middle of night and shake out his hand.  ROS: A 14 point ROS was performed and is negative except as noted in the HPI.  Past Medical History:  Diagnosis Date  . History of cardiac catheterization    Done in [place] - details not clear  . Hyperlipidemia      Family History  Problem Relation Age of Onset  . CAD Father        Premature CAD status post CABG  . CAD Paternal Grandfather        Premature CAD status post CABG     Social History:  reports that he has quit smoking. he has never used smokeless tobacco. He reports that he drinks alcohol. He reports that he does not use drugs.   Exam: Current vital signs: BP 126/76 (BP Location: Right Arm)   Pulse (!) 55   Temp 98 F (36.7 C) (Oral)   Resp 16   Ht 5\' 7"  (1.702 m)   Wt 71.7 kg (158  lb)   SpO2 98%   BMI 24.75 kg/m  Vital signs in last 24 hours: Temp:  [98 F (36.7 C)-98.8 F (37.1 C)] 98 F (36.7 C) (11/04 1839) Pulse Rate:  [55-60] 55 (11/04 1839) Resp:  [12-20] 16 (11/04 1839) BP: (111-133)/(72-92) 126/76 (11/04 1839) SpO2:  [95 %-99 %] 98 % (11/04 1839) Weight:  [71.7 kg (158 lb)] 71.7 kg (158 lb) (11/04 1453)   Physical Exam  Constitutional: Appears well-developed and well-nourished.  Psych: Affect appropriate to situation Eyes: No scleral injection HENT: No OP obstrucion Head: Normocephalic.  Cardiovascular: Normal rate and regular rhythm.  Respiratory: Effort normal and breath sounds normal to anterior ascultation GI: Soft.  No distension. There is no tenderness.  Skin: WDI  Neuro: Mental Status: Patient is awake, alert, oriented to person, place, month, year, and situation. Patient is able to give a clear and coherent history. No signs of aphasia or neglect Cranial Nerves: II: Visual Fields are full. Pupils are equal, round, and reactive to light.   III,IV, VI: EOMI without ptosis or diploplia.  V: Facial sensation is symmetric to temperature VII: Facial movement is symmetric.  VIII: hearing is intact to voice X: Uvula elevates symmetrically XI: Shoulder  shrug is symmetric. XII: tongue is midline without atrophy or fasciculations.  Motor: Tone is normal. Bulk is normal. 5/5 strength was present in all four extremities.  Sensory: Sensation is symmetric to light touch and temperature in the arms and legs with the exception of mild numbness in his right distal lower extremity Cerebellar: FNF  are intact bilaterally   I have reviewed labs in epic and the results pertinent to this consultation are: CMP-unremarkable other than mild hypercalcemia  I have reviewed the images obtained: MRI brain 10/19-unremarkable  Impression: 50 year old male with spells of unclear etiology.  Prior to being aggressive with antiepileptics, I would favor  confirming the character of the spells.  To this end, given the frequency of them, I think an overnight EEG to capture for the spells and confirm a epileptic versus nonepileptic nature would be prudent.  Recommendations: 1) overnight EEG, will be connected tomorrow 2) continue Keppra for now.   Roland Rack, MD [facility] [phone]  If 7pm- 7am, please page neurology on call as listed in [place].

## 2017-09-11 ENCOUNTER — Observation Stay (HOSPITAL_COMMUNITY): Payer: 59

## 2017-09-11 ENCOUNTER — Other Ambulatory Visit: Payer: Self-pay

## 2017-09-11 DIAGNOSIS — R569 Unspecified convulsions: Secondary | ICD-10-CM | POA: Diagnosis not present

## 2017-09-11 DIAGNOSIS — E876 Hypokalemia: Secondary | ICD-10-CM | POA: Diagnosis not present

## 2017-09-11 DIAGNOSIS — R634 Abnormal weight loss: Secondary | ICD-10-CM | POA: Diagnosis not present

## 2017-09-11 DIAGNOSIS — R079 Chest pain, unspecified: Secondary | ICD-10-CM | POA: Diagnosis not present

## 2017-09-11 LAB — RAPID URINE DRUG SCREEN, HOSP PERFORMED
AMPHETAMINES: NOT DETECTED
Barbiturates: NOT DETECTED
Benzodiazepines: POSITIVE — AB
COCAINE: NOT DETECTED
OPIATES: NOT DETECTED
Tetrahydrocannabinol: NOT DETECTED

## 2017-09-11 LAB — COMPREHENSIVE METABOLIC PANEL
ALBUMIN: 3.7 g/dL (ref 3.5–5.0)
ALK PHOS: 44 U/L (ref 38–126)
ALT: 16 U/L — ABNORMAL LOW (ref 17–63)
AST: 14 U/L — AB (ref 15–41)
Anion gap: 8 (ref 5–15)
BILIRUBIN TOTAL: 1 mg/dL (ref 0.3–1.2)
BUN: 11 mg/dL (ref 6–20)
CALCIUM: 8.6 mg/dL — AB (ref 8.9–10.3)
CO2: 23 mmol/L (ref 22–32)
Chloride: 107 mmol/L (ref 101–111)
Creatinine, Ser: 0.81 mg/dL (ref 0.61–1.24)
GFR calc Af Amer: >60 mL/min (ref >60)
GLUCOSE: 80 mg/dL (ref 65–99)
Potassium: 3.3 mmol/L — ABNORMAL LOW (ref 3.5–5.1)
Sodium: 138 mmol/L (ref 135–145)
TOTAL PROTEIN: 5.7 g/dL — AB (ref 6.5–8.1)

## 2017-09-11 LAB — FOLATE: FOLATE: 13.2 ng/mL (ref >5.9)

## 2017-09-11 LAB — IRON AND TIBC
Iron: 105 ug/dL (ref 45–182)
SATURATION RATIOS: 49 % — AB (ref 17.9–39.5)
TIBC: 216 ug/dL — AB (ref 250–450)
UIBC: 111 ug/dL

## 2017-09-11 LAB — RETICULOCYTES
RBC.: 5 MIL/uL (ref 4.22–5.81)
Retic Count, Absolute: 70 10*3/uL (ref 19.0–186.0)
Retic Ct Pct: 1.4 % (ref 0.4–3.1)

## 2017-09-11 LAB — FERRITIN: Ferritin: 289 ng/mL (ref 24–336)

## 2017-09-11 LAB — VITAMIN B12: VITAMIN B 12: 449 pg/mL (ref 180–914)

## 2017-09-11 MED ORDER — POTASSIUM CHLORIDE CRYS ER 20 MEQ PO TBCR
40.0000 meq | EXTENDED_RELEASE_TABLET | Freq: Once | ORAL | Status: AC
  Administered 2017-09-11: 40 meq via ORAL
  Filled 2017-09-11: qty 2

## 2017-09-11 NOTE — Progress Notes (Signed)
Subjective: No complaints.  Patient is feeling well.  No further episodes overnight known to patient.  Awaiting to be hooked up to LTM.   Objective: Current vital signs: BP 128/76 (BP Location: Right Arm)   Pulse (!) 54   Temp (!) 97.3 F (36.3 C) (Oral)   Resp 16   Ht 5\' 7"  (1.702 m)   Wt 71.7 kg (158 lb)   SpO2 97%   BMI 24.75 kg/m  Vital signs in last 24 hours: Temp:  [97.3 F (36.3 C)-98.8 F (37.1 C)] 97.3 F (36.3 C) (11/04 2042) Pulse Rate:  [54-60] 54 (11/04 2042) Resp:  [12-20] 16 (11/04 2042) BP: (111-133)/(72-92) 128/76 (11/04 2042) SpO2:  [95 %-99 %] 97 % (11/04 2042) Weight:  [71.7 kg (158 lb)] 71.7 kg (158 lb) (11/04 1453)  Intake/Output from previous day: 11/04 0701 - 11/05 0700 In: 1600 [I.V.:1600] Out: -  Intake/Output this shift: No intake/output data recorded. Nutritional status: Diet regular Room service appropriate? Yes; Fluid consistency: Thin  ROS:                                                                                                                                       History obtained from the patient  General ROS: negative for - chills, fatigue, fever, night sweats, weight gain or weight loss Psychological ROS: negative for - behavioral disorder, hallucinations, memory difficulties, mood swings or suicidal ideation Ophthalmic ROS: negative for - blurry vision, double vision, eye pain or loss of vision ENT ROS: negative for - epistaxis, nasal discharge, oral lesions, sore throat, tinnitus or vertigo Allergy and Immunology ROS: negative for - hives or itchy/watery eyes Hematological and Lymphatic ROS: negative for - bleeding problems, bruising or swollen lymph nodes Endocrine ROS: negative for - galactorrhea, hair pattern changes, polydipsia/polyuria or temperature intolerance Respiratory ROS: negative for - cough, hemoptysis, shortness of breath or wheezing Cardiovascular ROS: negative for - chest pain, dyspnea on exertion, edema or  irregular heartbeat Gastrointestinal ROS: negative for - abdominal pain, diarrhea, hematemesis, nausea/vomiting or stool incontinence Genito-Urinary ROS: negative for - dysuria, hematuria, incontinence or urinary frequency/urgency Musculoskeletal ROS: negative for - joint swelling or muscular weakness Neurological ROS: as noted in HPI Dermatological ROS: negative for rash and skin lesion changes    Neurologic Exam: General: NAD Mental Status: Alert, oriented, thought content appropriate.  Speech fluent without evidence of aphasia.  Able to follow 3 step commands without difficulty. 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  Motor: Right : Upper extremity   5/5    Left:     Upper extremity   5/5  Lower  extremity   5/5     Lower extremity   5/5 Tone and bulk:normal tone throughout; no atrophy noted Sensory: Pinprick and light touch intact throughout, bilaterally Deep Tendon Reflexes:  Right: Upper Extremity   Left: Upper extremity   biceps (C-5 to C-6) 2/4   biceps (C-5 to C-6) 2/4 tricep (C7) 2/4    triceps (C7) 2/4 Brachioradialis (C6) 2/4  Brachioradialis (C6) 2/4  Lower Extremity Lower Extremity  quadriceps (L-2 to L-4) 2/4   quadriceps (L-2 to L-4) 2/4 Achilles (S1) 2/4   Achilles (S1) 2/4  Plantars: Right: downgoing   Left: downgoing Cerebellar: normal finger-to-nose,  normal heel-to-shin test Gait: Not tested  Lab Results: Basic Metabolic Panel: Recent Labs  Lab 09/10/17 1441 09/11/17 0246  NA 139 138  K 3.7 3.3*  CL 109 107  CO2 25 23  GLUCOSE 88 80  BUN 14 11  CREATININE 0.78  0.81  CALCIUM 8.6* 8.6*    Liver Function Tests: Recent Labs  Lab 09/10/17 1441 09/11/17 0246  AST 17 14*  ALT 18 16*  ALKPHOS 47 44  BILITOT 0.9 1.0  PROT 5.8* 5.7*  ALBUMIN 3.8 3.7   No results for input(s): LIPASE, AMYLASE in the last 168 hours. No results for input(s): AMMONIA in the last 168 hours.  CBC: Recent Labs  Lab 09/10/17 1441  WBC 6.4  NEUTROABS 4.2  HGB 15.8  HCT 44.9  MCV 89.3  PLT 145*    Imaging: US Liver Doppler  Result Date: 09/11/2017 CLINICAL DATA:  Possible portal venous thrombus on previous CT. EXAM: DUPLEX ULTRASOUND OF LIVER TECHNIQUE: Color and duplex Doppler ultrasound was performed to evaluate the hepatic in-flow and out-flow vessels. COMPARISON:  CTA chest abdomen and pelvis 08/22/2017 FINDINGS: Portal Vein Velocities Main:  17.5 cm/sec Right:  23 cm/sec Left:  16.7 cm/sec Hepatic Vein Velocities Right:  45.1 cm/sec Middle:  31.3 cm/sec Left:  33.7 cm/sec Hepatic Artery Velocity:  135.6 cm/sec Splenic Vein Velocity:  33.1 cm/sec Varices: None visualized. Ascites: None visualized. Color flow Doppler imaging of the main portal veins and hepatic veins demonstrate flow within the visualized vessels. No filling defects identified. Normal flow direction is demonstrated. IMPRESSION: Normal Doppler evaluation of the hepatic vasculature. No evidence of portal venous thrombus. Electronically Signed   By: Lucienne Capers M.D.   On: 09/11/2017 01:55   MRI brain 10/19 --negative.    Medications:  Scheduled: . aspirin EC  81 mg Oral Daily  . enoxaparin (LOVENOX) injection  40 mg Subcutaneous Q24H    Assessment/Plan:  51 year old male with spells of unclear etiology.  At this point patient will remain on Keppra 1000 mg twice daily.  Patient will be hooked up to LTM today and further spells will be able to be characterized epileptic versus nonepileptic nature by LTM hopefully.  Continue with seizure precautions.  Neurology will continue to  follow.     Etta Quill PA-C [facility] [phone]  09/11/2017, 8:44 AM

## 2017-09-11 NOTE — Care Management Note (Signed)
Case Management Note  Patient Details  Name: Charles Tyler MRN: [id number] Date of Birth: 09-22-1966  Subjective/Objective:   Pt in to r/o seizures. He is from home with significant other.                  Action/Plan: Plan is for him to return home when medically ready. CM following for d/c needs, physician orders.   Expected Discharge Date:  09/11/17               Expected Discharge Plan:  Home/Self Care  In-House Referral:     Discharge planning Services     Post Acute Care Choice:    Choice offered to:     DME Arranged:    DME Agency:     HH Arranged:    HH Agency:     Status of Service:  In process, will continue to follow  If discussed at Long Length of Stay Meetings, dates discussed:    Additional Comments:  Pollie Friar, RN 09/11/2017, 10:38 AM

## 2017-09-11 NOTE — Progress Notes (Signed)
vLTM EEG running. Tested event button. Educated nurse on event button. Notified neuro

## 2017-09-11 NOTE — Progress Notes (Signed)
PROGRESS NOTE    Charles Tyler  BSJ:[id number] DOB: 1966/01/15 DOA: 09/10/2017 PCP: Rosita Fire, MD   Outpatient Specialists:     Brief Narrative:  Charles Tyler is a 51 y.o. male with medical history significant of chest pain for which she was evaluated here last month with a negative cardiac catheterization, discharge and hospitalized the next day with seizure episodes at home, he was seen by neurology during that hospitalization and was placed on Keppra.  Patient comes to the hospital being brought by the family today due to recurrent back-to-back seizures throughout the day.  Family was at bedside thinks that he may have had around 6 seizure episodes today.  Patient tells me that he remembers working in the yard, feeling funny, walking to his couch and the next thing he knows he is in the emergency room.  Significant other is at bedside and tells me that patient has been having multiple episodes of being "contracted", unresponsive, for a few minutes followed by episodes of confusion.  She reports that the patient had no incontinence, has not lost urine/stool, and no tongue biting.  Patient has no recollection of these episodes.  He denies any recent fever or chills, he has been having intermittent chest pains for the past few weeks, he denies any shortness of breath.  He has been complaining of a 10 pound weight loss over the past 1/2 weeks.  He has no lightheadedness or dizzy numbness and up until today's episodes he was feeling back to his normal self.  Significant other also tells me that occasionally when he sleeps he has significant muscle twitching, appears confused and somewhat aggressive.  Patient denies any alcohol or other substance use.  He reports being depressed and under a lot of stressors at home.  Previously mentioned when he was hospitalized was that he is going through a divorce.     Assessment & Plan:   Active Problems:   Chest pain   Seizure ([place])   Weight  loss   Seizure episodes -Patient was admitted with history of the same just couple weeks ago and placed on Keppra -reports compliance -overnight EEG ordered -EEG done during his prior hospital stay 08/25/2017 was normal, however does mention of "One event during the recording was non-epileptic in nature" -neuro consult appreciated  Chest pain, intermittent, atypical -He was fully evaluated by cardiology in October 2018.  Underwent a 2D echo on 08/23/2017 which showed normal EF of 60-65% and normal diastolic function. -He also underwent a cardiac catheterization on 08/23/2017 which showed mid LAD lesion, 25% stenosed, as well as Ost 1st Diag lesion, 50 %stenosed.  Overall there were no significant obstructive coronary disease. -He also underwent a CT angiogram on 08/22/2017 which was negative for acute findings, however there was a questionable filling defect of the portal splenic confluence with underfilling of the SMV, and this finding cannot exclude a nonocclusive thrombus -duplex ordered and was normal  Weight loss -Unclear etiology -10 pounds over the last 2 weeks -suspect related to depression  Hypokalemia -replete   DVT prophylaxis:  Lovenox   Code Status: Full Code   Family Communication:   Disposition Plan:     Consultants:   neuro   Subjective: No overnight events  Objective: Vitals:   09/10/17 1839 09/10/17 2042 09/10/17 2200 09/11/17 0952  BP: 126/76 128/76  98/61  Pulse: (!) 55 (!) 54  64  Resp: 16 16  18   Temp: 98 F (36.7 C) (!) 97.3 F (36.3 C)  98.8 F (37.1 C)  TempSrc: Oral Oral  Oral  SpO2: 98% 97%  98%  Weight:      Height:   5\' 7"  (1.702 m)     Intake/Output Summary (Last 24 hours) at 09/11/2017 1237 Last data filed at 09/11/2017 0846 Gross per 24 hour  Intake 1960 ml  Output -  Net 1960 ml   Filed Weights   09/10/17 1453  Weight: 71.7 kg (158 lb)    Examination:  General exam: Appears calm and comfortable    Respiratory system: Clear to auscultation. Respiratory effort normal. Cardiovascular system: S1 & S2 heard, RRR. No JVD, murmurs, rubs, gallops or clicks. No pedal edema. Gastrointestinal system: Abdomen is nondistended, soft and nontender. No organomegaly or masses felt. Normal bowel sounds heard. Central nervous system: Alert and oriented. No focal neurological deficits. Extremities: Symmetric 5 x 5 power. Skin: No rashes, lesions or ulcers Psychiatry: flat affect, no SI    Data Reviewed: I have personally reviewed following labs and imaging studies  CBC: Recent Labs  Lab 09/10/17 1441  WBC 6.4  NEUTROABS 4.2  HGB 15.8  HCT 44.9  MCV 89.3  PLT 580*   Basic Metabolic Panel: Recent Labs  Lab 09/10/17 1441 09/11/17 0246  NA 139 138  K 3.7 3.3*  CL 109 107  CO2 25 23  GLUCOSE 88 80  BUN 14 11  CREATININE 0.78 0.81  CALCIUM 8.6* 8.6*   GFR: Estimated Creatinine Clearance: 100.9 mL/min (by C-G formula based on SCr of 0.81 mg/dL). Liver Function Tests: Recent Labs  Lab 09/10/17 1441 09/11/17 0246  AST 17 14*  ALT 18 16*  ALKPHOS 47 44  BILITOT 0.9 1.0  PROT 5.8* 5.7*  ALBUMIN 3.8 3.7   No results for input(s): LIPASE, AMYLASE in the last 168 hours. No results for input(s): AMMONIA in the last 168 hours. Coagulation Profile: No results for input(s): INR, PROTIME in the last 168 hours. Cardiac Enzymes: No results for input(s): CKTOTAL, CKMB, CKMBINDEX, TROPONINI in the last 168 hours. BNP (last 3 results) No results for input(s): PROBNP in the last 8760 hours. HbA1C: No results for input(s): HGBA1C in the last 72 hours. CBG: No results for input(s): GLUCAP in the last 168 hours. Lipid Profile: No results for input(s): CHOL, HDL, LDLCALC, TRIG, CHOLHDL, LDLDIRECT in the last 72 hours. Thyroid Function Tests: Recent Labs    09/10/17 1901  TSH 1.940   Anemia Panel: Recent Labs    09/11/17 0246  VITAMINB12 449  FOLATE 13.2  FERRITIN 289  TIBC 216*   IRON 105  RETICCTPCT 1.4   Urine analysis:    Component Value Date/Time   COLORURINE YELLOW 08/22/2017 1656   APPEARANCEUR CLEAR 08/22/2017 1656   LABSPEC 1.017 08/22/2017 1656   PHURINE 6.0 08/22/2017 1656   GLUCOSEU NEGATIVE 08/22/2017 1656   HGBUR NEGATIVE 08/22/2017 1656   [place] 08/22/2017 1656   [place] 08/22/2017 1656   PROTEINUR NEGATIVE 08/22/2017 1656   NITRITE NEGATIVE 08/22/2017 1656   LEUKOCYTESUR NEGATIVE 08/22/2017 1656     )No results found for this or any previous visit (from the past 240 hour(s)).    Anti-infectives (From admission, onward)   None       Radiology Studies: US Liver Doppler  Result Date: 09/11/2017 CLINICAL DATA:  Possible portal venous thrombus on previous CT. EXAM: DUPLEX ULTRASOUND OF LIVER TECHNIQUE: Color and duplex Doppler ultrasound was performed to evaluate the hepatic in-flow and out-flow vessels. COMPARISON:  CTA chest abdomen and  pelvis 08/22/2017 FINDINGS: Portal Vein Velocities Main:  17.5 cm/sec Right:  23 cm/sec Left:  16.7 cm/sec Hepatic Vein Velocities Right:  45.1 cm/sec Middle:  31.3 cm/sec Left:  33.7 cm/sec Hepatic Artery Velocity:  135.6 cm/sec Splenic Vein Velocity:  33.1 cm/sec Varices: None visualized. Ascites: None visualized. Color flow Doppler imaging of the main portal veins and hepatic veins demonstrate flow within the visualized vessels. No filling defects identified. Normal flow direction is demonstrated. IMPRESSION: Normal Doppler evaluation of the hepatic vasculature. No evidence of portal venous thrombus. Electronically Signed   By: Lucienne Capers M.D.   On: 09/11/2017 01:55        Scheduled Meds: . aspirin EC  81 mg Oral Daily  . enoxaparin (LOVENOX) injection  40 mg Subcutaneous Q24H   Continuous Infusions: . levETIRAcetam 1,000 mg (09/11/17 0539)     LOS: 0 days    Time spent: 35 min    [place], DO [facility] Pager [phone]  If 7PM-7AM,  please contact night-coverage [email or web address] Password [facility] 09/11/2017, 12:37 PM

## 2017-09-12 DIAGNOSIS — R569 Unspecified convulsions: Secondary | ICD-10-CM | POA: Diagnosis not present

## 2017-09-12 DIAGNOSIS — R079 Chest pain, unspecified: Secondary | ICD-10-CM | POA: Diagnosis not present

## 2017-09-12 MED ORDER — TRAZODONE HCL 50 MG PO TABS
50.0000 mg | ORAL_TABLET | Freq: Every day | ORAL | Status: DC
  Administered 2017-09-12: 50 mg via ORAL
  Filled 2017-09-12: qty 1

## 2017-09-12 NOTE — Progress Notes (Signed)
PROGRESS NOTE    Charles Tyler  FYB:[id number] DOB: 23-Jul-1966 DOA: 09/10/2017 PCP: Rosita Fire, MD   Outpatient Specialists:     Brief Narrative:  Charles Tyler is a 51 y.o. male with medical history significant of chest pain for which she was evaluated here last month with a negative cardiac catheterization, discharge and hospitalized the next day with seizure episodes at home, he was seen by neurology during that hospitalization and was placed on Keppra.  Patient comes to the hospital being brought by the family today due to recurrent back-to-back seizures throughout the day.  Family was at bedside thinks that he may have had around 6 seizure episodes today.  Patient tells me that he remembers working in the yard, feeling funny, walking to his couch and the next thing he knows he is in the emergency room.  Significant other is at bedside and tells me that patient has been having multiple episodes of being "contracted", unresponsive, for a few minutes followed by episodes of confusion.  She reports that the patient had no incontinence, has not lost urine/stool, and no tongue biting.  Patient has no recollection of these episodes.  He denies any recent fever or chills, he has been having intermittent chest pains for the past few weeks, he denies any shortness of breath.  He has been complaining of a 10 pound weight loss over the past 1/2 weeks.  He has no lightheadedness or dizzy numbness and up until today's episodes he was feeling back to his normal self.  Significant other also tells me that occasionally when he sleeps he has significant muscle twitching, appears confused and somewhat aggressive.  Patient denies any alcohol or other substance use.  He reports being depressed and under a lot of stressors at home.  Previously mentioned when he was hospitalized was that he is going through a divorce.     Assessment & Plan:   Active Problems:   Chest pain   Seizure ([facility])   Weight  loss   Seizure episodes -Patient was admitted with history of the same just couple weeks ago and placed on Keppra -reports compliance -overnight EEG ordered but patient did not push buttons, plan to continue for another overnight -EEG done during his prior hospital stay 08/25/2017 was normal, however does mention of "One event during the recording was non-epileptic in nature" -neuro consult appreciated  Chest pain, intermittent, atypical -He was fully evaluated by cardiology in October 2018.  Underwent a 2D echo on 08/23/2017 which showed normal EF of 60-65% and normal diastolic function. -He also underwent a cardiac catheterization on 08/23/2017 which showed mid LAD lesion, 25% stenosed, as well as Ost 1st Diag lesion, 50 %stenosed.  Overall there were no significant obstructive coronary disease. -He also underwent a CT angiogram on 08/22/2017 which was negative for acute findings, however there was a questionable filling defect of the portal splenic confluence with underfilling of the SMV, and this finding cannot exclude a nonocclusive thrombus -duplex ordered and was normal  Weight loss -Unclear etiology -10 pounds over the last 2 weeks -suspect related to depression  Hypokalemia -replete   DVT prophylaxis:  Lovenox   Code Status: Full Code   Family Communication:   Disposition Plan:  Home in AM if EEG negative   Consultants:   neuro   Subjective: Not sleeping well  Objective: Vitals:   09/11/17 2056 09/12/17 0031 09/12/17 0525 09/12/17 1015  BP: 113/64 109/70 (!) 100/53 120/69  Pulse: 65 67 63 77  Resp:  18 18 20 20   Temp: 98.2 F (36.8 C) 98 F (36.7 C) 98.1 F (36.7 C) 98.6 F (37 C)  TempSrc: Oral Oral Oral Oral  SpO2: 97% 98% 97% 98%  Weight:      Height:        Intake/Output Summary (Last 24 hours) at 09/12/2017 1308 Last data filed at 09/12/2017 1100 Gross per 24 hour  Intake 930 ml  Output 1930 ml  Net -1000 ml   Filed Weights    09/10/17 1453  Weight: 71.7 kg (158 lb)    Examination:  General exam: NAD Respiratory system: clear Cardiovascular system: rrr Central nervous system: Alert and oriented. No focal neurological deficits. Psychiatry: more interactive with better eye contact today    Data Reviewed: I have personally reviewed following labs and imaging studies  CBC: Recent Labs  Lab 09/10/17 1441  WBC 6.4  NEUTROABS 4.2  HGB 15.8  HCT 44.9  MCV 89.3  PLT 599*   Basic Metabolic Panel: Recent Labs  Lab 09/10/17 1441 09/11/17 0246  NA 139 138  K 3.7 3.3*  CL 109 107  CO2 25 23  GLUCOSE 88 80  BUN 14 11  CREATININE 0.78 0.81  CALCIUM 8.6* 8.6*   GFR: Estimated Creatinine Clearance: 100.9 mL/min (by C-G formula based on SCr of 0.81 mg/dL). Liver Function Tests: Recent Labs  Lab 09/10/17 1441 09/11/17 0246  AST 17 14*  ALT 18 16*  ALKPHOS 47 44  BILITOT 0.9 1.0  PROT 5.8* 5.7*  ALBUMIN 3.8 3.7   No results for input(s): LIPASE, AMYLASE in the last 168 hours. No results for input(s): AMMONIA in the last 168 hours. Coagulation Profile: No results for input(s): INR, PROTIME in the last 168 hours. Cardiac Enzymes: No results for input(s): CKTOTAL, CKMB, CKMBINDEX, TROPONINI in the last 168 hours. BNP (last 3 results) No results for input(s): PROBNP in the last 8760 hours. HbA1C: No results for input(s): HGBA1C in the last 72 hours. CBG: No results for input(s): GLUCAP in the last 168 hours. Lipid Profile: No results for input(s): CHOL, HDL, LDLCALC, TRIG, CHOLHDL, LDLDIRECT in the last 72 hours. Thyroid Function Tests: Recent Labs    09/10/17 1901  TSH 1.940   Anemia Panel: Recent Labs    09/11/17 0246  VITAMINB12 449  FOLATE 13.2  FERRITIN 289  TIBC 216*  IRON 105  RETICCTPCT 1.4   Urine analysis:    Component Value Date/Time   COLORURINE YELLOW 08/22/2017 1656   APPEARANCEUR CLEAR 08/22/2017 1656   LABSPEC 1.017 08/22/2017 1656   PHURINE 6.0 08/22/2017  1656   GLUCOSEU NEGATIVE 08/22/2017 1656   HGBUR NEGATIVE 08/22/2017 1656   [place] 08/22/2017 1656   [place] 08/22/2017 1656   PROTEINUR NEGATIVE 08/22/2017 1656   NITRITE NEGATIVE 08/22/2017 1656   LEUKOCYTESUR NEGATIVE 08/22/2017 1656     )No results found for this or any previous visit (from the past 240 hour(s)).    Anti-infectives (From admission, onward)   None       Radiology Studies: US Liver Doppler  Result Date: 09/11/2017 CLINICAL DATA:  Possible portal venous thrombus on previous CT. EXAM: DUPLEX ULTRASOUND OF LIVER TECHNIQUE: Color and duplex Doppler ultrasound was performed to evaluate the hepatic in-flow and out-flow vessels. COMPARISON:  CTA chest abdomen and pelvis 08/22/2017 FINDINGS: Portal Vein Velocities Main:  17.5 cm/sec Right:  23 cm/sec Left:  16.7 cm/sec Hepatic Vein Velocities Right:  45.1 cm/sec Middle:  31.3 cm/sec Left:  33.7 cm/sec Hepatic  Artery Velocity:  135.6 cm/sec Splenic Vein Velocity:  33.1 cm/sec Varices: None visualized. Ascites: None visualized. Color flow Doppler imaging of the main portal veins and hepatic veins demonstrate flow within the visualized vessels. No filling defects identified. Normal flow direction is demonstrated. IMPRESSION: Normal Doppler evaluation of the hepatic vasculature. No evidence of portal venous thrombus. Electronically Signed   By: Lucienne Capers M.D.   On: 09/11/2017 01:55        Scheduled Meds: . aspirin EC  81 mg Oral Daily  . enoxaparin (LOVENOX) injection  40 mg Subcutaneous Q24H  . traZODone  50 mg Oral QHS   Continuous Infusions: . levETIRAcetam Stopped (09/12/17 3710)     LOS: 0 days    Time spent: 25 min    [place], DO [facility] Pager [phone]  If 7PM-7AM, please contact night-coverage [email or web address] Password [email or web address] 09/12/2017, 1:08 PM

## 2017-09-12 NOTE — Progress Notes (Addendum)
Subjective: Patient states that overnight he did not have any seizures however he did feel as though he was about to have seizures/spells.  Discussion was had in depth with he and his fiance today.  It was noted by the fianc that he often times will have spells in which he has jerking but is alert and able to talk, at other times he has more spells in which she has full body jerking but the eyes are closed.  They deny any urinary incontinence or fecal incontinence.  He also made mention that he has had periods of time where he was talking to his children would get very frustrated more emotional and then pass out.  He states he is never passed out or had a spell while he was driving.  He does admit to being under significant stress at home with a recent divorce, children issues, and work issues.  He also made mention that his 51 year old daughter has seizures.  Apparently she is on seizure medications.  He is unclear if the seizures that have been witnessed to have ever been captured on EEG.  Apparently she will have staring spells.  In addition there has been events where she is completely obtunded and will not wake up to any type of stimulus and had to be brought to hospitals.  The history on this is not extremely clear.  Exam: Vitals:   09/12/17 0525 09/12/17 1015  BP: (!) 100/53 120/69  Pulse: 63 77  Resp: 20 20  Temp: 98.1 F (36.7 C) 98.6 F (37 C)  SpO2: 97% 98%    HEENT-  Normocephalic, no lesions, without obvious abnormality.  Normal external eye and conjunctiva.  Normal TM's bilaterally.  Normal auditory canals and external ears. Normal external nose, mucus membranes and septum.  Normal pharynx. Cardiovascular- S1, S2 normal, pulses palpable throughout   Lungs- chest clear, no wheezing, rales, normal symmetric air entry Abdomen- normal findings: bowel sounds normal    Neuro:  CN: Pupils are equal and round. They are symmetrically reactive from 3-->2 mm. EOMI without nystagmus.  Facial sensation is intact to light touch. Face is symmetric at rest with normal strength and mobility. Hearing is intact to conversational voice. Palate elevates symmetrically and uvula is midline. Voice is normal in tone, pitch and quality. Bilateral SCM and trapezii are 5/5. Tongue is midline with normal bulk and mobility.  Motor: Normal bulk, tone, and strength. 5/5 throughout. No drift.  Sensation: Intact to light touch.  DTRs: 2+, symmetric  Toes downgoing bilaterally. No pathologic reflexes.  Coordination: Finger-to-nose and heel-to-shin are without dysmetria   Medications:  Scheduled: . aspirin EC  81 mg Oral Daily  . enoxaparin (LOVENOX) injection  40 mg Subcutaneous Q24H  . traZODone  50 mg Oral QHS   Continuous: . levETIRAcetam Stopped (09/12/17 3474)   QVZ:[id number]  Pertinent Labs/Diagnostics: none  US Liver Doppler  Result Date: 09/11/2017 CLINICAL DATA:  Possible portal venous thrombus on previous CT. EXAM: DUPLEX ULTRASOUND OF LIVER TECHNIQUE: Color and duplex Doppler ultrasound was performed to evaluate the hepatic in-flow and out-flow vessels. COMPARISON:  CTA chest abdomen and pelvis 08/22/2017 FINDINGS: Portal Vein Velocities Main:  17.5 cm/sec Right:  23 cm/sec Left:  16.7 cm/sec Hepatic Vein Velocities Right:  45.1 cm/sec Middle:  31.3 cm/sec Left:  33.7 cm/sec Hepatic Artery Velocity:  135.6 cm/sec Splenic Vein Velocity:  33.1 cm/sec Varices: None visualized. Ascites: None visualized. Color flow Doppler imaging of the main portal veins and hepatic veins demonstrate flow  within the visualized vessels. No filling defects identified. Normal flow direction is demonstrated. IMPRESSION: Normal Doppler evaluation of the hepatic vasculature. No evidence of portal venous thrombus. Electronically Signed   By: Lucienne Capers M.D.   On: 09/11/2017 01:55   LTM 24 hour 09/11/2017 EEG Description:  During wakefulness, there was a bilateral reactive 10 Hz posterior  dominant rhythm.  During drowsiness, slow roving eye movement, vertex waves and attenuation of background and were seen.  K complexes and spindles were present in stage II sleep.  No focal, lateralized or periodic abnormality was seen.  No epileptiform discharges or seizures were in evidence.  No spell was captured.  Impression:  This is a normal awake and asleep EEG.  Normal EEG does not support nor exclude a diagnosis of epilepsy.    Etta Quill PA-C [facility] [phone]  ASSESSMENT AND PLAN  Impression: Multiple spells as described above.  Etiology unclear if these are seizures versus nonepileptic seizures.  Patient admits to being under significant stress.  24-hour EEG thus far has shown no epileptiform activity.  At this time history is more convincing for likely emotional spells however as we have not obtained any push button events we will keep patient linked to LTM overnight.  Recommendations: 1) no changes at this time.  We will keep patient hooked to LTM overnight.  If no spells captured patient may be discharged tomorrow.    09/12/2017, 12:37 PM    NEUROHOSPITALIST ADDENDUM Seen and examined the patient this AM. Formulated plan as documented above. Recommendations as above. Will follow.  Karena Addison Ashely Joshua MD [facility] [phone]  If 7pm to 7am, please call on call as listed on AMION.

## 2017-09-12 NOTE — Procedures (Signed)
Continuous Video-EEG Report  Patient: Charles Tyler, Charles Tyler     EEG No.ID: [id number]     DOB: Apr 05, 1966 Age: 51 Room#3W05  MED REC NO: [id number] Gender: Male   TECH: [place]     Physician: Winfield Cunas     Referring Physician: Roland Rack     Report Date: 09/12/2017       Study Duration: 09/11/2017 11:00 to 09/12/2017 07:30 CPT Code:  63846 Diagnosis:  Spells (R56.9)  History: This is a 51 year old male presenting with seizure-like spells.  Video-EEG monitoring was performed to evaluate for seizures.  Technical Details:  Long-term video-EEG monitoring was performed using standard setting per the guidelines.  Briefly, a minimum of 21 electrodes were placed on scalp according to the International 10-20 or/and 10-10 Systems.  Supplemental electrodes were placed as needed.  Single EKG electrode was also used to detect cardiac arrhythmia.  Patient's behavior was continuously recorded on video simultaneously with EEG.  A minimum of 16 channels were used for data display.  Each epoch of study was reviewed manually daily and as needed using standard referential and bipolar montages.    EEG Description:  During wakefulness, there was a bilateral reactive 10 Hz posterior dominant rhythm.  During drowsiness, slow roving eye movement, vertex waves and attenuation of background and were seen.  K complexes and spindles were present in stage II sleep.  No focal, lateralized or periodic abnormality was seen.  No epileptiform discharges or seizures were in evidence.  No spell was captured.  Impression:  This is a normal awake and asleep EEG.  Normal EEG does not support nor exclude a diagnosis of epilepsy.    Reading Physician: Winfield Cunas, MD, PhD

## 2017-09-13 DIAGNOSIS — R569 Unspecified convulsions: Secondary | ICD-10-CM | POA: Diagnosis not present

## 2017-09-13 DIAGNOSIS — R079 Chest pain, unspecified: Secondary | ICD-10-CM | POA: Diagnosis not present

## 2017-09-13 MED ORDER — TRAZODONE HCL 50 MG PO TABS: 50.0000 mg | ORAL_TABLET | Freq: Every evening | ORAL | 0 refills | Status: DC | PRN

## 2017-09-13 MED ORDER — LEVETIRACETAM 500 MG PO TABS
1000.0000 mg | ORAL_TABLET | Freq: Two times a day (BID) | ORAL | Status: DC
  Administered 2017-09-13: 1000 mg via ORAL
  Filled 2017-09-13 (×2): qty 2

## 2017-09-13 NOTE — Progress Notes (Addendum)
Subjective: States he had no events overnight.  There were no push button events for EEG.  Patient's fianc was in the room with his kids and although he did feel slightly stressed he did not feel any seizure or seizure-like activity.  Exam: Vitals:   09/13/17 0502 09/13/17 1011  BP: (!) 105/59 (!) 102/55  Pulse: (!) 52 75  Resp: 18 18  Temp: 98.1 F (36.7 C) 98.2 F (36.8 C)  SpO2: 99% 97%    HEENT-  Normocephalic, no lesions, without obvious abnormality.  Normal external eye and conjunctiva.  Normal TM's bilaterally.  Normal auditory canals and external ears. Normal external nose, mucus membranes and septum.  Normal pharynx. Cardiovascular- S1, S2 normal, pulses palpable throughout   Lungs- chest clear, no wheezing, rales, normal symmetric air entry Abdomen- normal findings: bowel sounds normal Extremities- no edema    Neuro:  CN: Pupils are equal and round. They are symmetrically reactive from 3-->2 mm. EOMI without nystagmus. Facial sensation is intact to light touch. Face is symmetric at rest with normal strength and mobility. Hearing is intact to conversational voice. Palate elevates symmetrically and uvula is midline. Voice is normal in tone, pitch and quality. Bilateral SCM and trapezii are 5/5. Tongue is midline with normal bulk and mobility.  Motor: Normal bulk, tone, and strength. 5/5 throughout. No drift.  Sensation: Intact to light touch.  DTRs: 2+, symmetric  Toes downgoing bilaterally. No pathologic reflexes.  Coordination: Finger-to-nose and heel-to-shin are without dysmetria   Medications:  Scheduled: . aspirin EC  81 mg Oral Daily  . enoxaparin (LOVENOX) injection  40 mg Subcutaneous Q24H  . traZODone  50 mg Oral QHS   Continuous: . levETIRAcetam 1,000 mg (09/13/17 0548)    Pertinent Labs/Diagnostics: EEG results History:This is a 51 year old male presenting with seizure-like spells. Video-EEG monitoring was performed to evaluate for  seizures.  Technical Details:Long-term video-EEG monitoring was performed using standard setting per the guidelines. Briefly, a minimum of 21 electrodes were placed on scalp according to the International 10-20 or/and 10-10 Systems. Supplemental electrodes were placed as needed. Single EKG electrode was also used to detect cardiac arrhythmia. Patient's behavior was continuously recorded on video simultaneously with EEG. A minimum of 16 channels were used for data display. Each epoch of study was reviewed manually daily and as needed using standard referential and bipolar montages.   EEG Description: During wakefulness, there was a bilateral reactive 10 Hz posterior dominant rhythm. During drowsiness, slow roving eye movement, vertex waves and attenuation of background and were seen. K complexes and spindles were present in stage II sleep. No focal, lateralized or periodic abnormality was seen. No epileptiform discharges or seizures were in evidence. No spell was captured.  Impression: This is a normal awake and asleep EEG. Normal EEG does not support nor exclude a diagnosis of epilepsy.      No results found.   Etta Quill PA-C [facility] [phone]  Impression/recommendations Multiple spells as described above.  Etiology unclear if these are seizures versus nonepileptic seizures.  Patient admits to being under significant stress.  24-hour EEG thus far has shown no epileptiform activity.  At this time history is more convincing for likely emotional spells however as we have not obtained any push button events we will keep patient linked to LTM overnight.  Patient has now been linked to EEG for over 48 hours with no events.  Awaiting final reading of LTM for today.  If no events patient  will need to follow-up  with neurology as an outpatient.  Patient will also likely need to have a ambulatory EEG or further be evaluated at a tertiary center LTM for multiple  days.  At this time will continue his antiepileptic medication as an outpatient and have him follow-up with neurology as outpatient  NEUROHOSPITALIST ADDENDUM Seen and examined the patient this AM. Plan as above. Thanks for the consult.   Karena Addison Aroor MD [facility] [phone]  If 7pm to 7am, please call on call as listed on AMION.   09/13/2017, 10:43 AM

## 2017-09-13 NOTE — Progress Notes (Signed)
D/c reviewed with patient. Encouraged to follow up with scheduled appointments

## 2017-09-13 NOTE — Procedures (Signed)
Continuous Video-EEG Report  Patient: Charles Tyler, Charles Tyler     EEG No.ID: [id number]     DOB: 30-May-1966 Age: 51 Room#3W05  MED REC NO: [id number] Gender: Male   TECH: [place]     Physician: Winfield Cunas     Referring Physician: Karena Addison Aroor     Report Date: 09/13/2017       Study Duration:         09/12/2017 07:30 to 09/13/2017 07:30 CPT Code:                  57262 Diagnosis:                  Spells (R56.9)  History: This is a 51 year old male presenting with seizure-like spells.  Video-EEG monitoring was performed to evaluate for seizures.  Technical Details:  Long-term video-EEG monitoring was performed using standard setting per the guidelines.  Briefly, a minimum of 21 electrodes were placed on scalp according to the International 10-20 or/and 10-10 Systems.  Supplemental electrodes were placed as needed.  Single EKG electrode was also used to detect cardiac arrhythmia.  Patient's behavior was continuously recorded on video simultaneously with EEG.  A minimum of 16 channels were used for data display.  Each epoch of study was reviewed manually daily and as needed using standard referential and bipolar montages.    EEG Description:  During wakefulness, there was a bilateral reactive 10 Hz posterior dominant rhythm.  During drowsiness, slow roving eye movement, vertex waves and attenuation of background and were seen.  K complexes and spindles were present in stage II sleep.  No focal, lateralized or periodic abnormality was seen.  No epileptiform discharges or seizures were in evidence.  No spell was captured.  Impression:  This is a normal awake and asleep EEG.  Normal EEG does not support nor exclude a diagnosis of epilepsy.    Reading Physician: Winfield Cunas, MD, PhD

## 2017-09-13 NOTE — Care Management Note (Signed)
Case Management Note  Patient Details  Name: Heath Tesler MRN: [id number] Date of Birth: [birth year]  Subjective/Objective:                    Action/Plan: Pt discharging home with self care. CM consulted to assist in obtaining an appointment with [facility] in [place]. CM has called and left them a message.  CM informed the patient that he will be notified once CM has spoken to [facility]. Pt has transportation home today.  Expected Discharge Date:  09/13/17               Expected Discharge Plan:  Home/Self Care  In-House Referral:     Discharge planning Services  CM Consult, Follow-up appt scheduled  Post Acute Care Choice:    Choice offered to:     DME Arranged:    DME Agency:     HH Arranged:    HH Agency:     Status of Service:  Completed, signed off  If discussed at [employer] of Stay Meetings, dates discussed:    Additional Comments:  Pollie Friar, RN 09/13/2017, 1:42 PM

## 2017-09-13 NOTE — Progress Notes (Signed)
vEEG LTM complete. No skin breakdown 

## 2017-09-13 NOTE — Progress Notes (Signed)
Pt to be discharged but pt states he doesn't have a ride until around 1630.

## 2017-09-13 NOTE — Discharge Instructions (Signed)
Follow with Rosita Fire, MD in 5-7 days  Please get a complete blood count and chemistry panel checked by your Primary MD at your next visit, and again as instructed by your Primary MD. Please get your medications reviewed and adjusted by your Primary MD.  Please request your Primary MD to go over all Hospital Tests and Procedure/Radiological results at the follow up, please get all Hospital records sent to your Prim MD by signing hospital release before you go home.  If you had Pneumonia of Lung problems at the Hospital: Please get a 2 view Chest X ray done in 6-8 weeks after hospital discharge or sooner if instructed by your Primary MD.  If you have Congestive Heart Failure: Please call your Cardiologist or Primary MD anytime you have any of the following symptoms:  1) 3 pound weight gain in 24 hours or 5 pounds in 1 week  2) shortness of breath, with or without a dry hacking cough  3) swelling in the hands, feet or stomach  4) if you have to sleep on extra pillows at night in order to breathe  Follow cardiac low salt diet and 1.5 lit/day fluid restriction.  If you have diabetes Accuchecks 4 times/day, Once in AM empty stomach and then before each meal. Log in all results and show them to your primary doctor at your next visit. If any glucose reading is under 80 or above 300 call your primary MD immediately.  If you have Seizure/Convulsions/Epilepsy: Please do not drive, operate heavy machinery, participate in activities at heights or participate in high speed sports until you have seen by Primary MD or a Neurologist and advised to do so again.  If you had Gastrointestinal Bleeding: Please ask your Primary MD to check a complete blood count within one week of discharge or at your next visit. Your endoscopic/colonoscopic biopsies that are pending at the time of discharge, will also need to followed by your Primary MD.  Get Medicines reviewed and adjusted. Please take all your  medications with you for your next visit with your Primary MD  Please request your Primary MD to go over all hospital tests and procedure/radiological results at the follow up, please ask your Primary MD to get all Hospital records sent to his/her office.  If you experience worsening of your admission symptoms, develop shortness of breath, life threatening emergency, suicidal or homicidal thoughts you must seek medical attention immediately by calling 911 or calling your MD immediately  if symptoms less severe.  You must read complete instructions/literature along with all the possible adverse reactions/side effects for all the Medicines you take and that have been prescribed to you. Take any new Medicines after you have completely understood and accpet all the possible adverse reactions/side effects.   Do not drive or operate heavy machinery when taking Pain medications.   Do not take more than prescribed Pain, Sleep and Anxiety Medications  Special Instructions: If you have smoked or chewed Tobacco  in the last 2 yrs please stop smoking, stop any regular Alcohol  and or any Recreational drug use.  Wear Seat belts while driving.  Please note You were cared for by a hospitalist during your hospital stay. If you have any questions about your discharge medications or the care you received while you were in the hospital after you are discharged, you can call the unit and asked to speak with the hospitalist on call if the hospitalist that took care of you is not available. Once  you are discharged, your primary care physician will handle any further medical issues. Please note that NO REFILLS for any discharge medications will be authorized once you are discharged, as it is imperative that you return to your primary care physician (or establish a relationship with a primary care physician if you do not have one) for your aftercare needs so that they can reassess your need for medications and monitor your  lab values.  You can reach the hospitalist office at phone [phone] or fax [phone]   If you do not have a primary care physician, you can call [phone] for a physician referral.  Activity: As tolerated with Full fall precautions use walker/cane & assistance as needed

## 2017-09-13 NOTE — Discharge Summary (Signed)
Physician Discharge Summary  Charles Tyler HDQ:[id number] DOB: Nov 25, 1965 DOA: 09/10/2017  PCP: Rosita Fire, MD  Admit date: 09/10/2017 Discharge date: 09/13/2017   Recommendations for Outpatient Follow-Up:   1. Outpatient neurology follow up 2. Referral made to [facility] in [place]   Discharge Diagnosis:   Active Problems:   Chest pain   Seizure ([place])   Weight loss   Discharge disposition:  Home  Discharge Condition: Improved.  Diet recommendation: Low sodium, heart healthy  Wound care: None.   History of Present Illness:   Charles Tyler is a 51 y.o. male with medical history significant of chest pain for which she was evaluated here last month with a negative cardiac catheterization, discharge and hospitalized the next day with seizure episodes at home, he was seen by neurology during that hospitalization and was placed on Keppra.  Patient comes to the hospital being brought by the family today due to recurrent back-to-back seizures throughout the day.  Family was at bedside thinks that he may have had around 6 seizure episodes today.  Patient tells me that he remembers working in the yard, feeling funny, walking to his couch and the next thing he knows he is in the emergency room.  Significant other is at bedside and tells me that patient has been having multiple episodes of being "contracted", unresponsive, for a few minutes followed by episodes of confusion.  She reports that the patient had no incontinence, has not lost urine/stool, and no tongue biting.  Patient has no recollection of these episodes.  He denies any recent fever or chills, he has been having intermittent chest pains for the past few weeks, he denies any shortness of breath.  He has been complaining of a 10 pound weight loss over the past 1/2 weeks.  He has no lightheadedness or dizzy numbness and up until today's episodes he was feeling back to his normal self.  Significant other also tells me that  occasionally when he sleeps he has significant muscle twitching, appears confused and somewhat aggressive.  Patient denies any alcohol or other substance use.  He reports being depressed and under a lot of stressors at home.  Previously mentioned when he was hospitalized was that he is going through a divorce.     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7 C) 98.2 F (36.8 C) 98.3 F (36.8 C)  TempSrc: Oral Oral Oral Oral  SpO2: 98% 99% 97% 98%  Weight:      Height:        Gen:  NAD   The results of significant diagnostics from this hospitalization (including imaging, microbiology, ancillary and laboratory) are listed below for reference.     Procedures and Diagnostic Studies:   US Liver Doppler  Result Date:  09/11/2017 CLINICAL DATA:  Possible portal venous thrombus on previous CT. EXAM: DUPLEX ULTRASOUND OF LIVER TECHNIQUE: Color and duplex Doppler ultrasound was performed to evaluate the hepatic in-flow and out-flow vessels. COMPARISON:  CTA chest abdomen and pelvis 08/22/2017 FINDINGS: Portal Vein Velocities Main:  17.5 cm/sec Right:  23 cm/sec Left:  16.7 cm/sec Hepatic Vein Velocities Right:  45.1 cm/sec Middle:  31.3 cm/sec Left:  33.7 cm/sec Hepatic Artery Velocity:  135.6 cm/sec Splenic Vein Velocity:  33.1 cm/sec  Varices: None visualized. Ascites: None visualized. Color flow Doppler imaging of the main portal veins and hepatic veins demonstrate flow within the visualized vessels. No filling defects identified. Normal flow direction is demonstrated. IMPRESSION: Normal Doppler evaluation of the hepatic vasculature. No evidence of portal venous thrombus. Electronically Signed   By: Lucienne Capers M.D.   On: 09/11/2017 01:55     Labs:   Basic Metabolic Panel: Recent Labs  Lab 09/10/17 1441 09/11/17 0246  NA 139 138  K 3.7 3.3*  CL 109 107  CO2 25 23  GLUCOSE 88 80  BUN 14 11  CREATININE 0.78 0.81  CALCIUM 8.6* 8.6*   GFR Estimated Creatinine Clearance: 100.9 mL/min (by C-G formula based on SCr of 0.81 mg/dL). Liver Function Tests: Recent Labs  Lab 09/10/17 1441 09/11/17 0246  AST 17 14*  ALT 18 16*  ALKPHOS 47 44  BILITOT 0.9 1.0  PROT 5.8* 5.7*  ALBUMIN 3.8 3.7   No results for input(s): LIPASE, AMYLASE in the last 168 hours. No results for input(s): AMMONIA in the last 168 hours. Coagulation profile No results for input(s): INR, PROTIME in the last 168 hours.  CBC: Recent Labs  Lab 09/10/17 1441  WBC 6.4  NEUTROABS 4.2  HGB 15.8  HCT 44.9  MCV 89.3  PLT 145*   Cardiac Enzymes: No results for input(s): CKTOTAL, CKMB, CKMBINDEX, TROPONINI in the last 168 hours. BNP: Invalid input(s): POCBNP CBG: No results for input(s): GLUCAP in the last 168 hours. D-Dimer No results for input(s): DDIMER in the last 72 hours. Hgb A1c No results for input(s): HGBA1C in the last 72 hours. Lipid Profile No results for input(s): CHOL, HDL, LDLCALC, TRIG, CHOLHDL, LDLDIRECT in the last 72 hours. Thyroid function studies Recent Labs    09/10/17 1901  TSH 1.940   Anemia work up Recent Labs    09/11/17 0246  VITAMINB12 449  FOLATE 13.2  FERRITIN 289  TIBC 216*  IRON 105  RETICCTPCT 1.4   Microbiology No results found for this or any previous visit (from the past 240  hour(s)).   Discharge Instructions:   Discharge Instructions    Diet - low sodium heart healthy   Complete by:  As directed    Increase activity slowly   Complete by:  As directed      Allergies as of 09/13/2017   No Known Allergies     Medication List    TAKE these medications   aspirin EC 81 MG tablet Take 1 tablet (81 mg total) by mouth daily.   levETIRAcetam 1000 MG tablet Commonly known as:  KEPPRA Take 1 tablet (1,000 mg total) by mouth 2 (two) times daily.   methocarbamol 500 MG tablet Commonly known as:  ROBAXIN Take 1 or 2 po Q 6hrs for muscle pain   naproxen 500 MG tablet Commonly known as:  NAPROSYN Take 1 po BID with food prn pain   traZODone 50 MG tablet Commonly known as:  DESYREL Take 1 tablet (50 mg  total) at bedtime as needed by mouth for sleep.      Follow-up Information    [facility] Follow up.   Specialty:  Behavioral Health Why:  CM will call you with appointment time or inform you that you may need to call and set up the appointment.  Contact information: [address] [place] [phone]           Time coordinating discharge: 35 min  Signed:  Geradine Girt   [facility] 09/13/2017, 1:58 PM

## 2017-09-14 ENCOUNTER — Encounter (HOSPITAL_COMMUNITY): Payer: Self-pay | Admitting: Psychiatry

## 2017-09-14 ENCOUNTER — Ambulatory Visit (INDEPENDENT_AMBULATORY_CARE_PROVIDER_SITE_OTHER): Payer: 59 | Admitting: Psychiatry

## 2017-09-14 VITALS — BP 122/84 | HR 64 | Ht 67.0 in | Wt 160.4 lb

## 2017-09-14 DIAGNOSIS — F331 Major depressive disorder, recurrent, moderate: Secondary | ICD-10-CM

## 2017-09-14 DIAGNOSIS — F4310 Post-traumatic stress disorder, unspecified: Secondary | ICD-10-CM

## 2017-09-14 MED ORDER — MIRTAZAPINE 15 MG PO TABS: ORAL_TABLET | ORAL | 0 refills | Status: DC

## 2017-09-14 NOTE — Patient Instructions (Signed)
1. Start mirtazapine 7.5 mg at night one week, then 15 mg at night 2. Return to clinic in one month for 30 mins
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
## 2017-10-09 ENCOUNTER — Encounter: Payer: Self-pay | Admitting: Cardiology

## 2017-10-09 ENCOUNTER — Ambulatory Visit (INDEPENDENT_AMBULATORY_CARE_PROVIDER_SITE_OTHER): Payer: 59 | Admitting: Licensed Clinical Social Worker

## 2017-10-09 ENCOUNTER — Encounter (HOSPITAL_COMMUNITY): Payer: Self-pay | Admitting: Licensed Clinical Social Worker

## 2017-10-09 ENCOUNTER — Ambulatory Visit (INDEPENDENT_AMBULATORY_CARE_PROVIDER_SITE_OTHER): Payer: 59 | Admitting: Cardiology

## 2017-10-09 VITALS — BP 124/78 | HR 91 | Ht 67.0 in | Wt 163.0 lb

## 2017-10-09 DIAGNOSIS — F4310 Post-traumatic stress disorder, unspecified: Secondary | ICD-10-CM | POA: Diagnosis not present

## 2017-10-09 DIAGNOSIS — Z8659 Personal history of other mental and behavioral disorders: Secondary | ICD-10-CM

## 2017-10-09 DIAGNOSIS — F331 Major depressive disorder, recurrent, moderate: Secondary | ICD-10-CM

## 2017-10-09 DIAGNOSIS — Z87898 Personal history of other specified conditions: Secondary | ICD-10-CM

## 2017-10-09 DIAGNOSIS — Z9889 Other specified postprocedural states: Secondary | ICD-10-CM | POA: Diagnosis not present

## 2017-10-09 DIAGNOSIS — E782 Mixed hyperlipidemia: Secondary | ICD-10-CM

## 2017-10-09 DIAGNOSIS — Z720 Tobacco use: Secondary | ICD-10-CM | POA: Diagnosis not present

## 2017-10-09 NOTE — Progress Notes (Signed)
Cardiology Office Note  Date: 10/09/2017   ID: Charles Tyler, DOB June 11, 1966, MRN [id number]  PCP: Rosita Fire, MD  Primary Cardiologist: Rozann Lesches, MD   Chief Complaint  Patient presents with  . Cardiac follow-up    History of Present Illness: Charles Tyler is a 51 y.o. male that I met in consultation back in October with symptoms concerning for unstable angina, in light of a reported history of CAD with previous PCI in [place], details not clear.  He was referred for a diagnostic cardiac catheterization, performed on October 17 by Dr. Tamala Julian.  He was found to have only minor coronary luminal irregularities, no description of any stents.  Additional cardiac workup included an echocardiogram documenting normal LVEF and no major valvular abnormalities.  In reviewing the chart I find that he has had subsequent assessments for recurring seizures, followed by neurology at this point.  He has also been assessed by psychiatry for management of PTSD and depression.  He did wear a recent 30-day event recorder. Sinus rhythm, sinus bradycardia, and sinus arrhythmia were noted.  There were no pauses or arrhythmias observed.  I discussed these results with the patient and his significant other today.  From a cardiac perspective, I have recommended basic risk factor modification and follow-up with PCP.  He does not require further cardiac studies at this point, and does not need any specific cardiac restrictions regarding work.  Past Medical History:  Diagnosis Date  . History of cardiac catheterization    Done in [place] - details not clear  . Hyperlipidemia     Past Surgical History:  Procedure Laterality Date  . CHOLECYSTECTOMY    . LEFT HEART CATH AND CORONARY ANGIOGRAPHY N/A 08/23/2017   Procedure: LEFT HEART CATH AND CORONARY ANGIOGRAPHY;  Surgeon: Belva Crome, MD;  Location: [facility];  Service: Cardiovascular;  Laterality: N/A;    Current Outpatient  Medications  Medication Sig Dispense Refill  . aspirin EC 81 MG tablet Take 1 tablet (81 mg total) by mouth daily.    Marland Kitchen levETIRAcetam (KEPPRA) 1000 MG tablet Take 1 tablet (1,000 mg total) by mouth 2 (two) times daily. 60 tablet 0  . methocarbamol (ROBAXIN) 500 MG tablet Take 1 or 2 po Q 6hrs for muscle pain (Patient taking differently: Take 1 or 2 po Q 6hrs for muscle pain PRN) 60 tablet 0  . mirtazapine (REMERON) 15 MG tablet 7.5 mg at night for one week, then 15 mg at night 30 tablet 0  . naproxen (NAPROSYN) 500 MG tablet Take 1 po BID with food prn pain 30 tablet 0  . traZODone (DESYREL) 50 MG tablet Take 1 tablet (50 mg total) at bedtime as needed by mouth for sleep. 30 tablet 0   No current facility-administered medications for this visit.    Allergies:  Patient has no known allergies.   Social History: The patient  reports that he has been smoking.  he has never used smokeless tobacco. He reports that he drinks alcohol. He reports that he does not use drugs.   ROS:  Please see the history of present illness. Otherwise, complete review of systems is positive for interval seizures based on chart review.  All other systems are reviewed and negative.   Physical Exam: VS:  BP 124/78 (BP Location: Right Arm)   Pulse 91   Ht 5' 7" (1.702 m)   Wt 163 lb (73.9 kg)   SpO2 95%   BMI 25.53 kg/m , BMI Body  mass index is 25.53 kg/m.  Wt Readings from Last 3 Encounters:  10/09/17 163 lb (73.9 kg)  09/10/17 158 lb (71.7 kg)  08/25/17 158 lb 4.6 oz (71.8 kg)    General: Patient appears comfortable at rest. HEENT: Conjunctiva and lids normal, oropharynx clear. Neck: Supple, no elevated JVP or carotid bruits, no thyromegaly. Lungs: Clear to auscultation, nonlabored breathing at rest. Cardiac: Regular rate and rhythm, no S3 or significant systolic murmur, no pericardial rub. Abdomen: Soft, nontender, bowel sounds present. Extremities: No pitting edema, distal pulses 2+. Skin: Warm and  dry. Musculoskeletal: No kyphosis. Neuropsychiatric: Alert and oriented x3, affect grossly appropriate.  ECG: I personally reviewed the tracing from 08/31/2017 which showed normal sinus rhythm.  Recent Labwork: 09/10/2017: Hemoglobin 15.8; Platelets 145; TSH 1.940 09/11/2017: ALT 16; AST 14; BUN 11; Creatinine, Ser 0.81; Potassium 3.3; Sodium 138     Component Value Date/Time   CHOL 182 08/24/2017 0244   TRIG 94 08/24/2017 0244   HDL 30 (L) 08/24/2017 0244   CHOLHDL 6.1 08/24/2017 0244   VLDL 19 08/24/2017 0244   LDLCALC 133 (H) 08/24/2017 0244    Other Studies Reviewed Today:  Echocardiogram 08/23/2017: Study Conclusions  - Left ventricle: The cavity size was normal. Wall thickness was   normal. Systolic function was normal. The estimated ejection   fraction was in the range of 60% to 65%. Wall motion was normal;   there were no regional wall motion abnormalities. Left   ventricular diastolic function parameters were normal. - Aortic valve: Mildly calcified annulus. Trileaflet. - Mitral valve: There was trivial regurgitation. 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 - Atrial septum: No defect or patent foramen ovale was identified. - Tricuspid valve: There was trivial regurgitation. - Pulmonary arteries: Systolic pressure could not be accurately   estimated. - Pericardium, extracardiac: There was no pericardial effusion.  Impressions:  - Normal LV wall thickness with LVEF 60-65% and normal diastolic   function. Trivial mitral regurgitation. Mildly calcified aortic   annulus. Trivial tricuspid regurgitation.  Cardiac catheterization 08/23/2017:  Mid LAD lesion, 25 %stenosed.  Ost 1st Diag lesion, 50 %stenosed.  The left ventricular systolic function is normal.  LV end diastolic pressure is normal.  The left ventricular ejection fraction is 50-55% by visual estimate.    Right dominant coronary anatomy.  Minimal luminal irregularities  noted in LAD and first diagonal.  Normal LV function and end-diastolic pressure. EF estimated to be 55%.  30-day event recorder 08/31/2017: Representative strips from 30-day event recorder reviewed.  Sinus rhythm, sinus bradycardia, or sinus arrhythmia were noted with heart rates ranging from the high 50s to the mid 80s.  There was no obvious correlation with reported symptoms of chest discomfort, fatigue, dizziness, or even syncope with any particular arrhythmia or pause.  Assessment and Plan:  1.  Cardiac catheterization demonstrating only minor coronary luminal irregularities.  He reported a previous history of PCI in [place], although details are not clear.  LVEF is normal.  Would recommend basic risk factor modification.  Reasonable to continue low-dose aspirin, would also follow lipids with PCP and consider statin therapy.  No further cardiac testing is planned at this time.  He has no specific cardiac restrictions regarding work.  2.  Recurring seizures, follows with neurology at this point on medical therapy.  3.  History of PTSD and depression, follows with psychiatry.  4.  Tobacco abuse, smoking cessation recommended.  Current medicines were reviewed with the patient today.  Disposition: Follow-up with PCP.  Signed, Satira Sark, MD, [facility] 10/09/2017 4:32 PM    [facility] [address], [place], [place] [address] Phone: [phone]; Fax: [phone]
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
## 2017-10-09 NOTE — Patient Instructions (Signed)
Your physician recommends that you schedule a follow-up appointment in: as needed with Dr.McDowell   No lab work or tests ordered today.    Thank you for choosing [facility] !
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
## 2017-10-12 ENCOUNTER — Ambulatory Visit (INDEPENDENT_AMBULATORY_CARE_PROVIDER_SITE_OTHER): Payer: 59 | Admitting: Psychiatry

## 2017-10-12 ENCOUNTER — Encounter (HOSPITAL_COMMUNITY): Payer: Self-pay | Admitting: Psychiatry

## 2017-10-12 VITALS — BP 118/82 | HR 81 | Ht 67.0 in | Wt 162.0 lb

## 2017-10-12 DIAGNOSIS — F4310 Post-traumatic stress disorder, unspecified: Secondary | ICD-10-CM

## 2017-10-12 DIAGNOSIS — F331 Major depressive disorder, recurrent, moderate: Secondary | ICD-10-CM | POA: Diagnosis not present

## 2017-10-12 DIAGNOSIS — F17210 Nicotine dependence, cigarettes, uncomplicated: Secondary | ICD-10-CM | POA: Diagnosis not present

## 2017-10-12 DIAGNOSIS — G4700 Insomnia, unspecified: Secondary | ICD-10-CM | POA: Diagnosis not present

## 2017-10-12 DIAGNOSIS — Z811 Family history of alcohol abuse and dependence: Secondary | ICD-10-CM | POA: Diagnosis not present

## 2017-10-12 DIAGNOSIS — F419 Anxiety disorder, unspecified: Secondary | ICD-10-CM

## 2017-10-12 DIAGNOSIS — R450 Nervousness: Secondary | ICD-10-CM

## 2017-10-12 MED ORDER — MIRTAZAPINE 30 MG PO TABS: 30.0000 mg | ORAL_TABLET | Freq: Every day | ORAL | 0 refills | Status: DC

## 2017-10-12 NOTE — Patient Instructions (Signed)
1. Increase mirtazapine 30 mg at night  2. Return to clinic in one month for 30 mins

## 2017-10-25 ENCOUNTER — Ambulatory Visit: Payer: 59 | Admitting: Neurology

## 2017-10-25 ENCOUNTER — Encounter: Payer: Self-pay | Admitting: Neurology

## 2017-10-25 VITALS — BP 133/80 | HR 70 | Ht 67.0 in | Wt 165.5 lb

## 2017-10-25 DIAGNOSIS — R569 Unspecified convulsions: Secondary | ICD-10-CM

## 2017-10-25 MED ORDER — LEVETIRACETAM 750 MG PO TABS: 1500.0000 mg | ORAL_TABLET | Freq: Two times a day (BID) | ORAL | 11 refills | Status: DC

## 2017-10-25 MED ORDER — DIVALPROEX SODIUM ER 500 MG PO TB24: 1000.0000 mg | ORAL_TABLET | Freq: Every day | ORAL | 11 refills | Status: DC

## 2017-10-25 NOTE — Progress Notes (Signed)
PATIENT: Charles Tyler DOB: 28-Dec-1965  Chief Complaint  Patient presents with  . Seizure-like activity    He is here with his fiancee, Charles Tyler, for hospital follow up for seizures. His fiancee describes these events as body stiffness, body tremors  and disorientation.  He was started on Keppra 1000mg , BID but he is still having seizures.  He feels these events may have been occurring in his sleep for years.     Marland Kitchen PCP    Rosita Fire, MD     HISTORICAL  Charles Tyler is a 51 year old male, seen in refer by his primary care doctor Rosita Fire for evaluation of seizure-like activity, initial evaluation was on October 25, 2017.  I reviewed and summarized his most recent hospital discharges, patient presented with chest pain, had negative cardiac workup in October 2018, was admitted again in November for seizure-like activity, he was put on Keppra 500 mg twice daily, there was no significant change in his seizure activity, was given higher dose of Keppra 1000 mg twice daily, which also made only mild improvement in his seizure activity,  Per patient and his fiance of 79-month, who accompanied him at today's clinical visit, patient has every night body shaking episode, involving his right side more, body stiffness, woke up confused,  Patient reported 30 years history of daytime sudden passing out episode, it used to happen 3-4 episode each year, then began to happen couple times a day, since the treatment with Keppra, there was mild decreased the frequency,  Most recent episode was in November 2018, he was bathing his finances child, then was found on the floor, unconscious, he denies tongue biting, bowel bladder incontinence,  I personally reviewed MRI of the brain in October 2018 that was normal.  EEG was normal, overnight video EEG monitoring was normal in Nov 2018.  Extensive laboratory evaluations showed normal ferritin, folic acid, vitamin Z00, mildly low potassium  3.3, normal TSH, UDS was positive for benzo, CMP showed mildly low protein 5.8, CBC showed mildly low platelet 145  REVIEW OF SYSTEMS: Full 14 system review of systems performed and notable only for fatigue, ringing in ears, spinning sensation, joint pain, achy muscles, memory loss, confusion, headaches, dizziness, seizure, passing out, insomnia, sleepiness, restless leg, depression, anxiety, not enough sleep, decreased energy, disinterested in activities.  ALLERGIES: No Known Allergies  HOME MEDICATIONS: Current Outpatient Medications  Medication Sig Dispense Refill  . aspirin EC 81 MG tablet Take 1 tablet (81 mg total) by mouth daily.    Marland Kitchen levETIRAcetam (KEPPRA) 1000 MG tablet Take 1 tablet (1,000 mg total) by mouth 2 (two) times daily. 60 tablet 0  . methocarbamol (ROBAXIN) 500 MG tablet Take 1 or 2 po Q 6hrs for muscle pain (Patient taking differently: Take 1 or 2 po Q 6hrs for muscle pain PRN) 60 tablet 0  . mirtazapine (REMERON) 30 MG tablet Take 1 tablet (30 mg total) by mouth at bedtime. 30 tablet 0  . naproxen (NAPROSYN) 500 MG tablet Take 1 po BID with food prn pain 30 tablet 0  . traZODone (DESYREL) 50 MG tablet Take 1 tablet (50 mg total) at bedtime as needed by mouth for sleep. 30 tablet 0   No current facility-administered medications for this visit.     PAST MEDICAL HISTORY: Past Medical History:  Diagnosis Date  . Depression with anxiety   . History of cardiac catheterization    Done in [place] - details not clear  . Hyperlipidemia   . Migraine   .  Seizures ([place])     PAST SURGICAL HISTORY: Past Surgical History:  Procedure Laterality Date  . CHOLECYSTECTOMY    . LEFT HEART CATH AND CORONARY ANGIOGRAPHY N/A 08/23/2017   Procedure: LEFT HEART CATH AND CORONARY ANGIOGRAPHY;  Surgeon: Belva Crome, MD;  Location: [facility];  Service: Cardiovascular;  Laterality: N/A;    FAMILY HISTORY: Family History  Problem Relation Age of Onset  . CAD Father         Premature CAD status post CABG  . Heart attack Father   . COPD Father   . CAD Paternal Grandfather        Premature CAD status post CABG  . Alcohol abuse Maternal Grandfather   . Heart disease Maternal Grandfather   . Alcohol abuse Maternal Grandmother   . COPD Mother     SOCIAL HISTORY:  Social History   Socioeconomic History  . Marital status: Significant Other    Spouse name: Not on file  . Number of children: 4  . Years of education: 2 years college  . Highest education level: Not on file  Social Needs  . Financial resource strain: Not on file  . Food insecurity - worry: Not on file  . Food insecurity - inability: Not on file  . Transportation needs - medical: Not on file  . Transportation needs - non-medical: Not on file  Occupational History  . Occupation: Unemployed  Tobacco Use  . Smoking status: Current Every Day Smoker    Packs/day: 1.00    Types: Cigarettes  . Smokeless tobacco: Never Used  Substance and Sexual Activity  . Alcohol use: Yes    Comment: rarely, 09-14-2017 per pt occas  . Drug use: No    Comment: 09-14-2017 per pt stopped 30 years ago - Cocaine, Marijuana and speed   . Sexual activity: Not on file  Other Topics Concern  . Not on file  Social History Narrative   Lives at home with his fiancee.   Right-handed.   2 cups caffeine per day.     PHYSICAL EXAM   Vitals:   10/25/17 0813  BP: 133/80  Pulse: 70  Weight: 165 lb 8 oz (75.1 kg)  Height: 5\' 7"  (1.702 m)    Not recorded      Body mass index is 25.92 kg/m.  PHYSICAL EXAMNIATION:  Gen: NAD, conversant, well nourised, obese, well groomed                     Cardiovascular: Regular rate rhythm, no peripheral edema, warm, nontender. Eyes: Conjunctivae clear without exudates or hemorrhage Neck: Supple, no carotid bruits. Pulmonary: Clear to auscultation bilaterally   NEUROLOGICAL EXAM:  MENTAL STATUS: Speech:    Speech is normal; fluent and spontaneous with normal  comprehension.  Cognition:     Orientation to time, place and person     Normal recent and remote memory     Normal Attention span and concentration     Normal Language, naming, repeating,spontaneous speech     Fund of knowledge   CRANIAL NERVES: CN II: Visual fields are full to confrontation. Fundoscopic exam is normal with sharp discs and no vascular changes. Pupils are round equal and briskly reactive to light. CN III, IV, VI: extraocular movement are normal. No ptosis. CN V: Facial sensation is intact to pinprick in all 3 divisions bilaterally. Corneal responses are intact.  CN VII: Face is symmetric with normal eye closure and smile. CN VIII: Hearing is normal  to rubbing fingers CN IX, X: Palate elevates symmetrically. Phonation is normal. CN XI: Head turning and shoulder shrug are intact CN XII: Tongue is midline with normal movements and no atrophy.  MOTOR: There is no pronator drift of out-stretched arms. Muscle bulk and tone are normal. Muscle strength is normal.  REFLEXES: Reflexes are 2+ and symmetric at the biceps, triceps, knees, and ankles. Plantar responses are flexor.  SENSORY: Intact to light touch, pinprick, positional sensation and vibratory sensation are intact in fingers and toes.  COORDINATION: Rapid alternating movements and fine finger movements are intact. There is no dysmetria on finger-to-nose and heel-knee-shin.    GAIT/STANCE: Posture is normal. Gait is steady with normal steps, base, arm swing, and turning. Heel and toe walking are normal. Tandem gait is normal.  Romberg is absent.   DIAGNOSTIC DATA (LABS, IMAGING, TESTING) - I reviewed patient records, labs, notes, testing and imaging myself where available.   ASSESSMENT AND PLAN  Charles Tyler is a 51 y.o. male   Seizure-like spells,  Frequent occurrence despite normal MRI of the brain, overnight video EEG,  Titrating dose Keppra to 750 mg 2 tablets twice a day,  Add on Depakote ER 500  mg 2 tablets every night  Document of the event,  No driving until seizure-free for 6 months   Marcial Pacas, M.D. Ph.D.  [facility] [address] [place], [place] [address] Ph: [phone] Fax: [phone]  CC: Referring Provider

## 2017-10-26 ENCOUNTER — Encounter (HOSPITAL_COMMUNITY): Payer: Self-pay | Admitting: Licensed Clinical Social Worker

## 2017-10-26 ENCOUNTER — Ambulatory Visit (INDEPENDENT_AMBULATORY_CARE_PROVIDER_SITE_OTHER): Payer: 59 | Admitting: Licensed Clinical Social Worker

## 2017-10-26 DIAGNOSIS — F331 Major depressive disorder, recurrent, moderate: Secondary | ICD-10-CM

## 2017-10-26 NOTE — Progress Notes (Signed)
   THERAPIST PROGRESS NOTE  Session Time: 8:00 am- 8:50 am  Participation Level: Active  Behavioral Response: CasualAlertAnxious and Depressed  Type of Therapy: Individual Therapy  Treatment Goals addressed: Coping  Interventions: CBT and Solution Focused  Summary: Charles Tyler is a 51 y.o. male who presents oriented x5 (person, place, situation, time and object), alert, anxious, casually dressed, appropriately groomed, average height, average weight, and cooperative to address mood and anxiety. Patient has a history of medical history including seizures. Patient has no history of mental health treatment. Patient denies symptoms of mania. Patient denies suicidal and homicidal ideations. Patient denies psychosis including auditory and visual hallucinations. Patient denies current substance abuse. Patient is at low risk for lethality.   Patient had an average score of 5.25 out of 10 on the Outcome Rating Scale. Patient reported that he has been struggling financially due to not being able to find employment or get assistance. Patient also noted that he continues to experience seizures. Patient shared some of his concerns related to his relationship. He feels like his girlfriend is shutting down and not communicating with him due to her past and due to the stress of the present. After discussion, patient understood that he needs to work on listening, not over talking his girlfriend, and starting off small/slow with the communication.  Patient rated the session 10 out of 10 on the Session Rating Scale.   Patient engaged in session. He responded well to interventions. Patient continues to met criteria for Major depressive disorder, recurrent episode, moderate with anxious distress. Patient will continue in outpatient therapy due to being the least restrictive service to meet his needs at this time. Patient made no progress on his goals.  Suicidal/Homicidal: Negativewithout  intent/plan  Therapist Response: Therapist reviewed patient's recent thoughts and behaviors. Therapist utilized CBT to address mood. Therapist processed patient's feelings to identify triggers for mood. Therapist discussed patients relationship and ways to improve communication. Therapist administered the Outcome Rating Scale and the Session Rating Scale.   Plan: Return again in 3-4 weeks.  Diagnosis: Axis I: Major depressive disorder, recurrent episode, moderate with anxious distress    Axis II: No diagnosis    Glori Bickers, LCSW 10/26/2017

## 2017-10-27 ENCOUNTER — Encounter: Payer: Self-pay | Admitting: Neurology

## 2017-11-08 NOTE — Telephone Encounter (Signed)
[place] with [facility] called to let us know they will not being doing an EEG right yet but will let us know as soon as they find out when pt is ready
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
## 2017-11-13 ENCOUNTER — Encounter (HOSPITAL_COMMUNITY): Payer: Self-pay | Admitting: Psychiatry

## 2017-11-13 ENCOUNTER — Ambulatory Visit (INDEPENDENT_AMBULATORY_CARE_PROVIDER_SITE_OTHER): Payer: Self-pay | Admitting: Psychiatry

## 2017-11-13 VITALS — BP 159/89 | HR 97 | Ht 67.0 in | Wt 166.0 lb

## 2017-11-13 DIAGNOSIS — R450 Nervousness: Secondary | ICD-10-CM

## 2017-11-13 DIAGNOSIS — F331 Major depressive disorder, recurrent, moderate: Secondary | ICD-10-CM

## 2017-11-13 DIAGNOSIS — F4310 Post-traumatic stress disorder, unspecified: Secondary | ICD-10-CM

## 2017-11-13 DIAGNOSIS — F419 Anxiety disorder, unspecified: Secondary | ICD-10-CM

## 2017-11-13 DIAGNOSIS — Z560 Unemployment, unspecified: Secondary | ICD-10-CM

## 2017-11-13 DIAGNOSIS — R45851 Suicidal ideations: Secondary | ICD-10-CM

## 2017-11-13 DIAGNOSIS — F17210 Nicotine dependence, cigarettes, uncomplicated: Secondary | ICD-10-CM

## 2017-11-13 DIAGNOSIS — G4700 Insomnia, unspecified: Secondary | ICD-10-CM

## 2017-11-13 DIAGNOSIS — Z811 Family history of alcohol abuse and dependence: Secondary | ICD-10-CM

## 2017-11-13 MED ORDER — MIRTAZAPINE 30 MG PO TABS: 30.0000 mg | ORAL_TABLET | Freq: Every day | ORAL | 0 refills | Status: DC

## 2017-11-13 MED ORDER — SERTRALINE HCL 50 MG PO TABS: ORAL_TABLET | ORAL | 0 refills | Status: DC

## 2017-11-13 NOTE — Patient Instructions (Signed)
1. Continue mirtazapine 30 mg at night  2. Start sertraline 25 mg daily for one week, then 50 mg daily  3. Return to clinic in one month for 30 mins

## 2017-11-24 ENCOUNTER — Ambulatory Visit (HOSPITAL_COMMUNITY): Payer: Self-pay | Admitting: Licensed Clinical Social Worker

## 2017-11-24 ENCOUNTER — Encounter (HOSPITAL_COMMUNITY): Payer: Self-pay | Admitting: Licensed Clinical Social Worker

## 2017-11-24 DIAGNOSIS — F331 Major depressive disorder, recurrent, moderate: Secondary | ICD-10-CM
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
## 2017-12-15 ENCOUNTER — Ambulatory Visit (HOSPITAL_COMMUNITY): Payer: Self-pay | Admitting: Psychiatry

## 2017-12-15 ENCOUNTER — Encounter (HOSPITAL_COMMUNITY): Payer: Self-pay | Admitting: Psychiatry

## 2017-12-15 VITALS — BP 130/84 | HR 75 | Ht 67.0 in | Wt 172.0 lb

## 2017-12-15 DIAGNOSIS — F419 Anxiety disorder, unspecified: Secondary | ICD-10-CM

## 2017-12-15 DIAGNOSIS — F17210 Nicotine dependence, cigarettes, uncomplicated: Secondary | ICD-10-CM

## 2017-12-15 DIAGNOSIS — G4700 Insomnia, unspecified: Secondary | ICD-10-CM

## 2017-12-15 DIAGNOSIS — F331 Major depressive disorder, recurrent, moderate: Secondary | ICD-10-CM

## 2017-12-15 DIAGNOSIS — Z560 Unemployment, unspecified: Secondary | ICD-10-CM

## 2017-12-15 DIAGNOSIS — Z811 Family history of alcohol abuse and dependence: Secondary | ICD-10-CM

## 2017-12-15 DIAGNOSIS — F4310 Post-traumatic stress disorder, unspecified: Secondary | ICD-10-CM

## 2017-12-15 DIAGNOSIS — R450 Nervousness: Secondary | ICD-10-CM

## 2017-12-15 MED ORDER — MIRTAZAPINE 30 MG PO TABS: 30.0000 mg | ORAL_TABLET | Freq: Every day | ORAL | 1 refills | Status: DC

## 2017-12-15 MED ORDER — FLUOXETINE HCL 20 MG PO CAPS: 20.0000 mg | ORAL_CAPSULE | Freq: Every day | ORAL | 1 refills | Status: DC

## 2017-12-15 NOTE — Patient Instructions (Addendum)
1. Decrease sertraline 25 mg daily for one week, then discontinue 2. Start fluoxetine 20 mg daily  3. Continue mirtazapine 30 mg at night 3. Return to clinic in two months for 30 mins

## 2017-12-19 ENCOUNTER — Encounter (HOSPITAL_COMMUNITY): Payer: Self-pay | Admitting: Licensed Clinical Social Worker

## 2017-12-19 ENCOUNTER — Ambulatory Visit (HOSPITAL_COMMUNITY): Payer: Self-pay | Admitting: Licensed Clinical Social Worker

## 2017-12-19 DIAGNOSIS — F331 Major depressive disorder, recurrent, moderate: Secondary | ICD-10-CM

## 2017-12-19 NOTE — Progress Notes (Signed)
THERAPIST PROGRESS NOTE  Session Time: 10:00 am-10:45 am  Participation Level: Active  Behavioral Response: CasualAlertAnxious and Depressed  Type of Therapy: Individual Therapy  Treatment Goals addressed: Coping  Interventions: CBT and Solution Focused  Summary: Charles Tyler is a 51 y.o. male who presents oriented x5 (person, place, situation, time and object), alert, anxious, casually dressed, appropriately groomed, average height, average weight, and cooperative to address mood and anxiety. Patient has a history of medical history including seizures. Patient has no history of mental health treatment. Patient denies symptoms of mania. Patient denies suicidal and homicidal ideations. Patient denies psychosis including auditory and visual hallucinations. Patient denies current substance abuse. Patient is at low risk for lethality.   Physical: Patient continues to have seizures. He has low energy, restless nights and low libido.   Spiritual/Values: Patient reported that he is trying to find a church locally. He has attended a few churches.  Relationships: Patient noted that his relationship with his fiance is going ok. He feels like she is holding back and not opening up. Patient feels like the trust is improving. He has a strained relationship with his family of origin but a close relationship with his older sister.  Emotional/Mental/Behavior: Patient noted that he has felt depressed. He is not working and feels like he is not contributing to the family. Patient wants to go back to work and also feels like if he works he will go back to having severe seizures. Patient is working on trying to accept where he is at with his health and not working. After discussion, patient understood that he is contributing by maintaining the home, co-parenting and keeping busy.   Patient engaged in session. He responded well to interventions. Patient continues to met criteria for Major depressive  disorder, recurrent episode, moderate with anxious distress. Patient will continue in outpatient therapy due to being the least restrictive service to meet his needs at this time. Patient made minimal progress on his goals.  Suicidal/Homicidal: Negativewithout intent/plan  Therapist Response: Therapist reviewed patient's recent thoughts and behaviors. Therapist utilized CBT to address mood. Therapist processed patient's feelings to identify triggers for mood. Therapist discussed patient's desire to contribute to his family and his feelings of depression.   Plan: Return again in 3-4 weeks.  Diagnosis: Axis I: Major depressive disorder, recurrent episode, moderate with anxious distress    Axis II: No diagnosis    Joshua Sheets, LCSW 12/19/2017 
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
## 2018-01-11 ENCOUNTER — Encounter (HOSPITAL_COMMUNITY): Payer: Self-pay | Admitting: Psychiatry

## 2018-01-11 ENCOUNTER — Ambulatory Visit (HOSPITAL_COMMUNITY): Payer: Self-pay | Admitting: Psychiatry

## 2018-01-11 VITALS — BP 146/92 | HR 72 | Ht 67.0 in | Wt 181.0 lb

## 2018-01-11 DIAGNOSIS — G4700 Insomnia, unspecified: Secondary | ICD-10-CM

## 2018-01-11 DIAGNOSIS — Z560 Unemployment, unspecified: Secondary | ICD-10-CM

## 2018-01-11 DIAGNOSIS — F17210 Nicotine dependence, cigarettes, uncomplicated: Secondary | ICD-10-CM

## 2018-01-11 DIAGNOSIS — Z811 Family history of alcohol abuse and dependence: Secondary | ICD-10-CM

## 2018-01-11 DIAGNOSIS — Z0271 Encounter for disability determination: Secondary | ICD-10-CM

## 2018-01-11 DIAGNOSIS — F4310 Post-traumatic stress disorder, unspecified: Secondary | ICD-10-CM

## 2018-01-11 DIAGNOSIS — F331 Major depressive disorder, recurrent, moderate: Secondary | ICD-10-CM

## 2018-01-11 MED ORDER — FLUOXETINE HCL 20 MG PO CAPS: 20.0000 mg | ORAL_CAPSULE | Freq: Every day | ORAL | 0 refills | Status: DC

## 2018-01-11 MED ORDER — MIRTAZAPINE 30 MG PO TABS: 30.0000 mg | ORAL_TABLET | Freq: Every day | ORAL | 0 refills | Status: DC

## 2018-01-11 NOTE — Patient Instructions (Signed)
1. Continue fluoxetine 20 mg daily  2. Continue mirtazapine 30 mg at night  3. Return to clinic in three months for 15 mins

## 2018-01-23 ENCOUNTER — Ambulatory Visit: Payer: 59 | Admitting: Neurology

## 2018-01-24 ENCOUNTER — Encounter (HOSPITAL_COMMUNITY): Payer: Self-pay | Admitting: Cardiology

## 2018-01-24 ENCOUNTER — Emergency Department (HOSPITAL_COMMUNITY): Payer: Medicaid Other

## 2018-01-24 ENCOUNTER — Emergency Department (HOSPITAL_COMMUNITY)
Admission: EM | Admit: 2018-01-24 | Discharge: 2018-01-24 | Disposition: A | Discharge status: Home / Self Care | Payer: Medicaid Other | Attending: Emergency Medicine | Admitting: Emergency Medicine

## 2018-01-24 DIAGNOSIS — Z7982 Long term (current) use of aspirin: Secondary | ICD-10-CM | POA: Diagnosis not present

## 2018-01-24 DIAGNOSIS — M5430 Sciatica, unspecified side: Secondary | ICD-10-CM | POA: Insufficient documentation

## 2018-01-24 DIAGNOSIS — R569 Unspecified convulsions: Secondary | ICD-10-CM | POA: Diagnosis present

## 2018-01-24 DIAGNOSIS — Z79899 Other long term (current) drug therapy: Secondary | ICD-10-CM | POA: Insufficient documentation

## 2018-01-24 DIAGNOSIS — F17210 Nicotine dependence, cigarettes, uncomplicated: Secondary | ICD-10-CM | POA: Insufficient documentation

## 2018-01-24 DIAGNOSIS — E860 Dehydration: Secondary | ICD-10-CM | POA: Insufficient documentation

## 2018-01-24 LAB — COMPREHENSIVE METABOLIC PANEL
ALT: 63 U/L (ref 17–63)
ANION GAP: 9 (ref 5–15)
AST: 34 U/L (ref 15–41)
Albumin: 4.1 g/dL (ref 3.5–5.0)
Alkaline Phosphatase: 74 U/L (ref 38–126)
BUN: 18 mg/dL (ref 6–20)
CHLORIDE: 106 mmol/L (ref 101–111)
CO2: 18 mmol/L — ABNORMAL LOW (ref 22–32)
CREATININE: 0.89 mg/dL (ref 0.61–1.24)
Calcium: 8.8 mg/dL — ABNORMAL LOW (ref 8.9–10.3)
Glucose, Bld: 90 mg/dL (ref 65–99)
POTASSIUM: 3.9 mmol/L (ref 3.5–5.1)
Sodium: 133 mmol/L — ABNORMAL LOW (ref 135–145)
Total Bilirubin: 0.6 mg/dL (ref 0.3–1.2)
Total Protein: 7.2 g/dL (ref 6.5–8.1)

## 2018-01-24 LAB — CBC WITH DIFFERENTIAL/PLATELET
Basophils Absolute: 0 10*3/uL (ref 0.0–0.1)
Basophils Relative: 0 %
EOS PCT: 12 %
Eosinophils Absolute: 1.1 10*3/uL — ABNORMAL HIGH (ref 0.0–0.7)
HCT: 48.5 % (ref 39.0–52.0)
Hemoglobin: 17.1 g/dL — ABNORMAL HIGH (ref 13.0–17.0)
LYMPHS ABS: 1.7 10*3/uL (ref 0.7–4.0)
LYMPHS PCT: 19 %
MCH: 32.1 pg (ref 26.0–34.0)
MCHC: 35.3 g/dL (ref 30.0–36.0)
MCV: 91.2 fL (ref 78.0–100.0)
MONOS PCT: 8 %
Monocytes Absolute: 0.8 10*3/uL (ref 0.1–1.0)
Neutro Abs: 5.7 10*3/uL (ref 1.7–7.7)
Neutrophils Relative %: 61 %
PLATELETS: 152 10*3/uL (ref 150–400)
RBC: 5.32 MIL/uL (ref 4.22–5.81)
RDW: 13.6 % (ref 11.5–15.5)
WBC: 9.3 10*3/uL (ref 4.0–10.5)

## 2018-01-24 LAB — VALPROIC ACID LEVEL

## 2018-01-24 MED ORDER — SODIUM CHLORIDE 0.9 % IV BOLUS (SEPSIS)
1000.0000 mL | Freq: Once | INTRAVENOUS | Status: AC
  Administered 2018-01-24: 1000 mL via INTRAVENOUS

## 2018-01-24 MED ORDER — KETOROLAC TROMETHAMINE 30 MG/ML IJ SOLN
30.0000 mg | Freq: Once | INTRAMUSCULAR | Status: AC
  Administered 2018-01-24: 30 mg via INTRAVENOUS
  Filled 2018-01-24: qty 1

## 2018-01-24 MED ORDER — PREDNISONE 10 MG PO TABS: 20.0000 mg | ORAL_TABLET | Freq: Every day | ORAL | 0 refills | Status: DC

## 2018-01-24 MED ORDER — HYDROCODONE-ACETAMINOPHEN 5-325 MG PO TABS: 1.0000 | ORAL_TABLET | Freq: Four times a day (QID) | ORAL | 0 refills | Status: DC | PRN

## 2018-01-24 NOTE — ED Triage Notes (Signed)
Pt has been having frequent seizures times  several months.  Has history of seizures.  Pt here for c/o lower back, bilateral legs,  Right shoulder and left thumb pain.  Also c/o dizziness and weakness times 10 days.

## 2018-01-24 NOTE — Discharge Instructions (Signed)
Contact your neurologist and let her know about your Depakote level being 0.  Also let her know that she been having more seizures and see if she wants to change any of her medicines around.  Follow-up with your family doctor if your back does not improve

## 2018-01-24 NOTE — ED Provider Notes (Addendum)
[facility] EMERGENCY DEPARTMENT Provider Note   CSN: [id number] Arrival date & time: 01/24/18  7253     History   Chief Complaint Chief Complaint  Patient presents with  . Seizures    HPI Charles Tyler is a 52 y.o. male.  Patient states that he had a seizure and he is having some back problems, patient also complains of some dizziness   The history is provided by the patient.  Seizures   This is a recurrent problem. The current episode started 12 to 24 hours ago. The problem has been resolved. There was 1 seizure. Pertinent negatives include no headaches, no chest pain, no cough and no diarrhea. Characteristics do not include eye blinking. The episode was witnessed. The seizure(s) had no focality.    Past Medical History:  Diagnosis Date  . Depression with anxiety   . History of cardiac catheterization    Done in [place] - details not clear  . Hyperlipidemia   . Migraine   . Seizures [facility])     Patient Active Problem List   Diagnosis Date Noted  . PTSD (post-traumatic stress disorder) 09/14/2017  . MDD (major depressive disorder), recurrent episode, moderate ([place]) 09/14/2017  . Seizure ([place]) 09/10/2017  . Weight loss 09/10/2017  . New onset seizure ([place]) 08/25/2017  . Seizures ([place]) 08/25/2017  . Chest pain 08/22/2017  . History of coronary artery stent placement 08/22/2017  . Hyperlipidemia 08/22/2017  . Back pain 08/22/2017  . Syncope 08/22/2017    Past Surgical History:  Procedure Laterality Date  . CHOLECYSTECTOMY    . LEFT HEART CATH AND CORONARY ANGIOGRAPHY N/A 08/23/2017   Procedure: LEFT HEART CATH AND CORONARY ANGIOGRAPHY;  Surgeon: Belva Crome, MD;  Location: [facility];  Service: Cardiovascular;  Laterality: N/A;       Home Medications    Prior to Admission medications   Medication Sig Start Date End Date Taking? Authorizing Provider  Ascorbic Acid (VITAMIN C) 100 MG tablet Take 200 mg by mouth daily.   Yes [provider]  aspirin EC 81 MG tablet Take 1 tablet (81 mg total) by mouth daily. 08/24/17  Yes Reino Bellis B, NP  divalproex (DEPAKOTE ER) 500 MG 24 hr tablet Take 2 tablets (1,000 mg total) by mouth daily. 10/25/17  Yes Marcial Pacas, MD  FLUoxetine (PROZAC) 20 MG capsule Take 1 capsule (20 mg total) by mouth daily. 01/11/18  Yes Norman Clay, MD  levETIRAcetam (KEPPRA) 750 MG tablet Take 2 tablets (1,500 mg total) by mouth 2 (two) times daily. 10/25/17  Yes Marcial Pacas, MD  methocarbamol (ROBAXIN) 500 MG tablet Take 1 or 2 po Q 6hrs for muscle pain Patient taking differently: Take 1,000 mg by mouth at bedtime.  09/01/17  Yes Rolland Porter, MD  mirtazapine (REMERON) 30 MG tablet Take 1 tablet (30 mg total) by mouth at bedtime. 01/11/18  Yes Norman Clay, MD  HYDROcodone-acetaminophen (NORCO/VICODIN) 5-325 MG tablet Take 1 tablet by mouth every 6 (six) hours as needed for moderate pain. 01/24/18   Milton Ferguson, MD  naproxen (NAPROSYN) 500 MG tablet Take 1 po BID with food prn pain Patient not taking: Reported on 01/24/2018 09/01/17   Rolland Porter, MD  predniSONE (DELTASONE) 10 MG tablet Take 2 tablets (20 mg total) by mouth daily. 01/24/18   Milton Ferguson, MD    Family History Family History  Problem Relation Age of Onset  . CAD Father        Premature CAD status post  CABG  . Heart attack Father   . COPD Father   . CAD Paternal Grandfather        Premature CAD status post CABG  . Alcohol abuse Maternal Grandfather   . Heart disease Maternal Grandfather   . Alcohol abuse Maternal Grandmother   . COPD Mother     Social History Social History   Tobacco Use  . Smoking status: Current Every Day Smoker    Packs/day: 1.00    Types: Cigarettes  . Smokeless tobacco: Never Used  Substance Use Topics  . Alcohol use: Yes    Comment: rarely, 09-14-2017 per pt occas  . Drug use: No    Comment: 09-14-2017 per pt stopped 30 years ago - Cocaine, Marijuana and speed      Allergies   Aspirin  and Other   Review of Systems Review of Systems  Constitutional: Negative for appetite change and fatigue.  HENT: Negative for congestion, ear discharge and sinus pressure.   Eyes: Negative for discharge.  Respiratory: Negative for cough.   Cardiovascular: Negative for chest pain.  Gastrointestinal: Negative for abdominal pain and diarrhea.  Genitourinary: Negative for frequency and hematuria.  Musculoskeletal: Positive for back pain.  Skin: Negative for rash.  Neurological: Positive for seizures. Negative for headaches.  Psychiatric/Behavioral: Negative for hallucinations.     Physical Exam Updated Vital Signs BP 131/85   Pulse 62   Resp 15   Ht 5\' 7"  (1.702 m)   Wt 82.1 kg (181 lb)   SpO2 98%   BMI 28.35 kg/m   Physical Exam  Constitutional: He is oriented to person, place, and time. He appears well-developed.  HENT:  Head: Normocephalic.  Eyes: Conjunctivae and EOM are normal. No scleral icterus.  Neck: Neck supple. No thyromegaly present.  Cardiovascular: Normal rate and regular rhythm. Exam reveals no gallop and no friction rub.  No murmur heard. Pulmonary/Chest: No stridor. He has no wheezes. He has no rales. He exhibits no tenderness.  Abdominal: He exhibits no distension. There is no tenderness. There is no rebound.  Musculoskeletal: Normal range of motion. He exhibits no edema.  Positive straight leg raise the right leg  Lymphadenopathy:    He has no cervical adenopathy.  Neurological: He is oriented to person, place, and time. He exhibits normal muscle tone. Coordination normal.  Skin: No rash noted. No erythema.  Psychiatric: He has a normal mood and affect. His behavior is normal.     ED Treatments / Results  Labs (all labs ordered are listed, but only abnormal results are displayed) Labs Reviewed  CBC WITH DIFFERENTIAL/PLATELET - Abnormal; Notable for the following components:      Result Value   Hemoglobin 17.1 (*)    Eosinophils Absolute 1.1 (*)     All other components within normal limits  COMPREHENSIVE METABOLIC PANEL - Abnormal; Notable for the following components:   Sodium 133 (*)    CO2 18 (*)    Calcium 8.8 (*)    All other components within normal limits  VALPROIC ACID LEVEL - Abnormal; Notable for the following components:   Valproic Acid Lvl <10 (*)    All other components within normal limits    EKG  EKG Interpretation None       EKG Interpretation  Date/Time:    Ventricular Rate:    PR Interval:    QRS Duration:   QT Interval:    QTC Calculation:   R Axis:     Text Interpretation:  ekg, rate 69, pr 180,  qrs 100,  Left axis deviation,  qtc 401,  Early repol  Radiology Dg Lumbar Spine Complete  Result Date: 01/24/2018 CLINICAL DATA:  Low back pain after fall last week. EXAM: LUMBAR SPINE - COMPLETE 4+ VIEW COMPARISON:  None. FINDINGS: Five lumbar type vertebral bodies. No acute fracture or subluxation. Vertebral body heights are preserved. Alignment is normal. Mild disc height loss and facet arthropathy at L5-S1. The sacroiliac joints are intact. IMPRESSION: 1.  No acute osseous abnormality. 2. Mild degenerative disc disease and facet arthropathy at L5-S1. Electronically Signed   By: Titus Dubin M.D.   On: 01/24/2018 10:21   Ct Head Wo Contrast  Result Date: 01/24/2018 CLINICAL DATA:  52 year old male with increasing frequency of seizures. Recent dizziness and weakness. Altered mental status. EXAM: CT HEAD WITHOUT CONTRAST TECHNIQUE: Contiguous axial images were obtained from the base of the skull through the vertex without intravenous contrast. COMPARISON:  Brain MRI and head CT 08/25/2017. FINDINGS: Brain: Cerebral volume is stable and normal. No midline shift, ventriculomegaly, mass effect, evidence of mass lesion, intracranial hemorrhage or evidence of cortically based acute infarction. Gray-white matter differentiation is within normal limits throughout the brain. Vascular: No suspicious  intracranial vascular hyperdensity. Skull: Stable and negative. Sinuses/Orbits: Clear. Other: Visualized orbits and scalp soft tissues are within normal limits. IMPRESSION: Stable and normal noncontrast Head CT. Electronically Signed   By: Genevie Ann M.D.   On: 01/24/2018 10:32    Procedures Procedures (including critical care time)  Medications Ordered in ED Medications  sodium chloride 0.9 % bolus 1,000 mL (0 mLs Intravenous Stopped 01/24/18 1107)  ketorolac (TORADOL) 30 MG/ML injection 30 mg (30 mg Intravenous Given 01/24/18 0951)   Labs reviewed.  CT scan of the head normal.  Plain film of the back shows some disc disease L5-S1.  Patient improved with Toradol IV.  Patient has sciatica on the right side and will be treated with prednisone and Vicodin.  Patient has history of seizures and is on Keppra and Depakote.  The Depakote level for some reason was 0.  The patient will contact his neurologist to see if there is any need for any changes in his medication  Initial Impression / Assessment and Plan / ED Course  I have reviewed the triage vital signs and the nursing notes.  Pertinent labs & imaging results that were available during my care of the patient were reviewed by me and considered in my medical decision making (see chart for details).       Final Clinical Impressions(s) / ED Diagnoses   Final diagnoses:  Sciatica, unspecified laterality  Dehydration    ED Discharge Orders        Ordered    predniSONE (DELTASONE) 10 MG tablet  Daily     01/24/18 1206    HYDROcodone-acetaminophen (NORCO/VICODIN) 5-325 MG tablet  Every 6 hours PRN     01/24/18 1206       Milton Ferguson, MD 01/24/18 1220    Milton Ferguson, MD 02/07/18 1043

## 2018-02-14 ENCOUNTER — Ambulatory Visit (INDEPENDENT_AMBULATORY_CARE_PROVIDER_SITE_OTHER): Payer: Self-pay | Admitting: Licensed Clinical Social Worker

## 2018-02-14 ENCOUNTER — Encounter (HOSPITAL_COMMUNITY): Payer: Self-pay | Admitting: Licensed Clinical Social Worker

## 2018-02-14 ENCOUNTER — Other Ambulatory Visit: Payer: Self-pay

## 2018-02-14 ENCOUNTER — Emergency Department (HOSPITAL_COMMUNITY)
Admission: EM | Admit: 2018-02-14 | Discharge: 2018-02-14 | Disposition: A | Discharge status: Home / Self Care | Payer: Medicaid Other | Attending: Emergency Medicine | Admitting: Emergency Medicine

## 2018-02-14 ENCOUNTER — Encounter (HOSPITAL_COMMUNITY): Payer: Self-pay | Admitting: Emergency Medicine

## 2018-02-14 ENCOUNTER — Encounter (HOSPITAL_COMMUNITY): Payer: Self-pay

## 2018-02-14 ENCOUNTER — Emergency Department (HOSPITAL_COMMUNITY): Payer: Medicaid Other

## 2018-02-14 DIAGNOSIS — M545 Low back pain, unspecified: Secondary | ICD-10-CM

## 2018-02-14 DIAGNOSIS — S0181XA Laceration without foreign body of other part of head, initial encounter: Secondary | ICD-10-CM

## 2018-02-14 DIAGNOSIS — Z79899 Other long term (current) drug therapy: Secondary | ICD-10-CM | POA: Diagnosis not present

## 2018-02-14 DIAGNOSIS — R569 Unspecified convulsions: Secondary | ICD-10-CM | POA: Insufficient documentation

## 2018-02-14 DIAGNOSIS — Z87891 Personal history of nicotine dependence: Secondary | ICD-10-CM | POA: Insufficient documentation

## 2018-02-14 DIAGNOSIS — Z7982 Long term (current) use of aspirin: Secondary | ICD-10-CM | POA: Insufficient documentation

## 2018-02-14 DIAGNOSIS — R110 Nausea: Secondary | ICD-10-CM | POA: Diagnosis not present

## 2018-02-14 DIAGNOSIS — Y939 Activity, unspecified: Secondary | ICD-10-CM | POA: Insufficient documentation

## 2018-02-14 DIAGNOSIS — T426X6A Underdosing of other antiepileptic and sedative-hypnotic drugs, initial encounter: Secondary | ICD-10-CM | POA: Insufficient documentation

## 2018-02-14 DIAGNOSIS — Z91120 Patient's intentional underdosing of medication regimen due to financial hardship: Secondary | ICD-10-CM | POA: Insufficient documentation

## 2018-02-14 DIAGNOSIS — Y92009 Unspecified place in unspecified non-institutional (private) residence as the place of occurrence of the external cause: Secondary | ICD-10-CM | POA: Insufficient documentation

## 2018-02-14 DIAGNOSIS — W01190A Fall on same level from slipping, tripping and stumbling with subsequent striking against furniture, initial encounter: Secondary | ICD-10-CM | POA: Insufficient documentation

## 2018-02-14 DIAGNOSIS — Y998 Other external cause status: Secondary | ICD-10-CM | POA: Diagnosis not present

## 2018-02-14 DIAGNOSIS — W19XXXA Unspecified fall, initial encounter: Secondary | ICD-10-CM

## 2018-02-14 DIAGNOSIS — F331 Major depressive disorder, recurrent, moderate: Secondary | ICD-10-CM

## 2018-02-14 MED ORDER — SODIUM CHLORIDE 0.9 % IV BOLUS
1000.0000 mL | Freq: Once | INTRAVENOUS | Status: AC
  Administered 2018-02-14: 1000 mL via INTRAVENOUS

## 2018-02-14 MED ORDER — ONDANSETRON HCL 4 MG/2ML IJ SOLN
INTRAMUSCULAR | Status: AC
  Filled 2018-02-14: qty 2

## 2018-02-14 MED ORDER — METHOCARBAMOL 500 MG PO TABS: ORAL_TABLET | ORAL | 0 refills | Status: DC

## 2018-02-14 MED ORDER — ONDANSETRON HCL 4 MG/2ML IJ SOLN
4.0000 mg | Freq: Once | INTRAMUSCULAR | Status: AC
  Administered 2018-02-14: 4 mg via INTRAVENOUS

## 2018-02-14 MED ORDER — DIVALPROEX SODIUM ER 500 MG PO TB24: 1000.0000 mg | ORAL_TABLET | Freq: Every day | ORAL | 0 refills | Status: DC

## 2018-02-14 MED ORDER — IBUPROFEN 800 MG PO TABS: 800.0000 mg | ORAL_TABLET | Freq: Three times a day (TID) | ORAL | 0 refills | Status: DC | PRN

## 2018-02-14 MED ORDER — LEVETIRACETAM IN NACL 500 MG/100ML IV SOLN
500.0000 mg | Freq: Two times a day (BID) | INTRAVENOUS | Status: DC
  Administered 2018-02-14: 500 mg via INTRAVENOUS
  Filled 2018-02-14 (×3): qty 100

## 2018-02-14 MED ORDER — LORAZEPAM 2 MG/ML IJ SOLN
INTRAMUSCULAR | Status: AC
  Administered 2018-02-14: 1 mg
  Filled 2018-02-14: qty 1

## 2018-02-14 MED ORDER — LORAZEPAM 2 MG/ML IJ SOLN
INTRAMUSCULAR | Status: AC
  Filled 2018-02-14: qty 1

## 2018-02-14 MED ORDER — LEVETIRACETAM 750 MG PO TABS: 1500.0000 mg | ORAL_TABLET | Freq: Two times a day (BID) | ORAL | 0 refills | Status: DC

## 2018-02-14 MED ORDER — LIDOCAINE HCL (PF) 2 % IJ SOLN
INTRAMUSCULAR | Status: AC
  Administered 2018-02-14: 5 mL
  Filled 2018-02-14: qty 10

## 2018-02-14 MED ORDER — IBUPROFEN 800 MG PO TABS
800.0000 mg | ORAL_TABLET | Freq: Once | ORAL | Status: AC
  Administered 2018-02-14: 800 mg via ORAL
  Filled 2018-02-14: qty 1

## 2018-02-14 NOTE — ED Triage Notes (Signed)
Pt arrived via [facility] c/o of seizures. Pt denies LOC. Pt was discharged from ED earlier today after being treated for same issue. Pt reports he cannot afford to fill his prescriptions.

## 2018-02-14 NOTE — ED Notes (Signed)
Pt has 3 cm lac above left eyebrow

## 2018-02-14 NOTE — ED Notes (Signed)
Pt had iv to left ac. Iv taken out with bleeding controlled and cath intact.

## 2018-02-14 NOTE — Progress Notes (Signed)
   THERAPIST PROGRESS NOTE  Session Time: 9:00 am-9:45 am  Participation Level: Active  Behavioral Response: CasualAlertAnxious and Depressed  Type of Therapy: Individual Therapy  Treatment Goals addressed: Coping  Interventions: CBT and Solution Focused  Summary: Charles Tyler is a 52 y.o. male who presents oriented x5 (person, place, situation, time and object), alert, anxious, casually dressed, appropriately groomed, average height, average weight, and cooperative to address mood and anxiety. Patient has a history of medical history including seizures. Patient has no history of mental health treatment. Patient denies symptoms of mania. Patient denies suicidal and homicidal ideations. Patient denies psychosis including auditory and visual hallucinations. Patient denies current substance abuse. Patient is at low risk for lethality.   Physical: Patient continues to have seizures at night. He hurt his back.    Spiritual/Values: No issue identified.  Relationships: Patient is unsure of his relationship with his fiance. He loves her. He is committed to he but he feels unsure of his role. Patient feels like he is expected to be a father but that his fiance doesn't want to have a relationship with him. He reported that she talks to exs on facebook, on the phone, etc. He feels like she has not moved past them.  Emotional/Mental/Behavior: Patient reported that his mood has been ok. He has good days and bad days. His relationship impacts his mood as well as his physical health. He wants to work but also recognizes that his body won't allow him.  Patient engaged in session. He responded well to interventions. Patient continues to met criteria for Major depressive disorder, recurrent episode, moderate with anxious distress. Patient will continue in outpatient therapy due to being the least restrictive service to meet his needs at this time. Patient made minimal progress on his  goals.  Suicidal/Homicidal: Negativewithout intent/plan  Therapist Response: Therapist reviewed patient's recent thoughts and behaviors. Therapist utilized CBT to address mood. Therapist processed patient's feelings to identify triggers for mood. Therapist discussed his concerns with his relationship.    Plan: Return again in 3-4 weeks.  Diagnosis: Axis I: Major depressive disorder, recurrent episode, moderate with anxious distress    Axis II: No diagnosis    Glori Bickers, LCSW 02/14/2018

## 2018-02-14 NOTE — ED Provider Notes (Signed)
[facility] EMERGENCY DEPARTMENT Provider Note   CSN: [id number] Arrival date & time: 02/14/18  1141     History   Chief Complaint Chief Complaint  Patient presents with  . Seizures    HPI Charles Tyler is a 52 y.o. male.  HPI Vision presents after an episode of seizure-like activity. He is here with his fiance who assists with the HPI. He has a history of seizures, migraines, has not taken any medication in approximately 3 months, due to reported difficulty with access. Today, the patient had what appeared to be atypical seizure to the fianc, fell against a table. He sustained a laceration to his left brow. He recovered in a typical manner, but soon after arrival to the emergency department had return of shaking. Patient subsequently received Ativan, and now, on the initial evaluation is minimally interactive, level 5 caveat secondary to acuity of condition. Ellene Route notes that the patient has been otherwise well until today's seizure, though he has had prior episodes, as he has not been taking his medication. She notes that he is applying for disability, and has not been able to either his primary care physician or his neurologist. She denies any other recent changes in behavior, activity.  She acknowledges that the patient has chronic back pain, reports that the patient seemingly exacerbated his chronic back pain during his seizure and fall earlier today.  Past Medical History:  Diagnosis Date  . Depression with anxiety   . History of cardiac catheterization    Done in [place] - details not clear  . Hyperlipidemia   . Migraine   . Seizures [facility])     Patient Active Problem List   Diagnosis Date Noted  . PTSD (post-traumatic stress disorder) 09/14/2017  . MDD (major depressive disorder), recurrent episode, moderate ([place]) 09/14/2017  . Seizure ([place]) 09/10/2017  . Weight loss 09/10/2017  . New onset seizure ([place]) 08/25/2017  . Seizures ([place]) 08/25/2017  . Chest  pain 08/22/2017  . History of coronary artery stent placement 08/22/2017  . Hyperlipidemia 08/22/2017  . Back pain 08/22/2017  . Syncope 08/22/2017    Past Surgical History:  Procedure Laterality Date  . CHOLECYSTECTOMY    . LEFT HEART CATH AND CORONARY ANGIOGRAPHY N/A 08/23/2017   Procedure: LEFT HEART CATH AND CORONARY ANGIOGRAPHY;  Surgeon: Belva Crome, MD;  Location: [facility];  Service: Cardiovascular;  Laterality: N/A;        Home Medications    Prior to Admission medications   Medication Sig Start Date End Date Taking? Authorizing Provider  Ascorbic Acid (VITAMIN C) 100 MG tablet Take 200 mg by mouth daily.    [provider]  aspirin EC 81 MG tablet Take 1 tablet (81 mg total) by mouth daily. 08/24/17   Cheryln Manly, NP  divalproex (DEPAKOTE ER) 500 MG 24 hr tablet Take 2 tablets (1,000 mg total) by mouth daily. 10/25/17   Marcial Pacas, MD  FLUoxetine (PROZAC) 20 MG capsule Take 1 capsule (20 mg total) by mouth daily. 01/11/18   Norman Clay, MD  HYDROcodone-acetaminophen (NORCO/VICODIN) 5-325 MG tablet Take 1 tablet by mouth every 6 (six) hours as needed for moderate pain. 01/24/18   Milton Ferguson, MD  levETIRAcetam (KEPPRA) 750 MG tablet Take 2 tablets (1,500 mg total) by mouth 2 (two) times daily. 10/25/17   Marcial Pacas, MD  methocarbamol (ROBAXIN) 500 MG tablet Take 1 or 2 po Q 6hrs for muscle pain Patient taking differently: Take 1,000 mg by mouth at  bedtime.  09/01/17   Rolland Porter, MD  mirtazapine (REMERON) 30 MG tablet Take 1 tablet (30 mg total) by mouth at bedtime. 01/11/18   Norman Clay, MD  naproxen (NAPROSYN) 500 MG tablet Take 1 po BID with food prn pain Patient not taking: Reported on 01/24/2018 09/01/17   Rolland Porter, MD  predniSONE (DELTASONE) 10 MG tablet Take 2 tablets (20 mg total) by mouth daily. 01/24/18   Milton Ferguson, MD    Family History Family History  Problem Relation Age of Onset  . CAD Father        Premature CAD status  post CABG  . Heart attack Father   . COPD Father   . CAD Paternal Grandfather        Premature CAD status post CABG  . Alcohol abuse Maternal Grandfather   . Heart disease Maternal Grandfather   . Alcohol abuse Maternal Grandmother   . COPD Mother     Social History Social History   Tobacco Use  . Smoking status: Former Smoker    Packs/day: 1.00    Types: Cigarettes    Last attempt to quit: 01/05/2018    Years since quitting: 0.1  . Smokeless tobacco: Never Used  Substance Use Topics  . Alcohol use: Yes    Comment: rarely, 09-14-2017 per pt occas  . Drug use: No    Comment: 09-14-2017 per pt stopped 30 years ago - Cocaine, Marijuana and speed      Allergies   Aspirin and Other   Review of Systems Review of Systems  Unable to perform ROS: Other  Postictal phase, review of systems as provided by fianc in HPI.   Physical Exam Updated Vital Signs BP 139/84   Pulse 68   Temp 98 F (36.7 C) (Axillary)   Resp (!) 23   Ht 5\' 7"  (1.702 m)   Wt 77.6 kg (171 lb)   SpO2 95%   BMI 26.78 kg/m    Physical exam amended after the patient returned to full interactivity.  Physical Exam  Constitutional: He is oriented to person, place, and time. He appears well-developed.  Listless male awakening slowly, nodding occasionally  HENT:  Head: Normocephalic.    Eyes: Conjunctivae and EOM are normal.  Cardiovascular: Normal rate and regular rhythm.  Pulmonary/Chest: Effort normal. No stridor. No respiratory distress.  Abdominal: He exhibits no distension.  Musculoskeletal: He exhibits no edema.  Neurological: He is oriented to person, place, and time.  After the patient awakened from his postictal phase he was awake, alert, oriented appropriately, moving all extremities.  Occasional shaking during the initial part of the patient's evaluation, but this resolved after Ativan, IV Keppra, fluids.  Skin: Skin is warm and dry.  Psychiatric: He has a normal mood and affect.    Nursing note and vitals reviewed.    ED Treatments / Results   Procedures .Marland KitchenLaceration Repair Date/Time: 02/14/2018 1:16 PM Performed by: Carmin Muskrat, MD Authorized by: Carmin Muskrat, MD   Consent:    Consent obtained:  Verbal   Consent given by:  Patient   Risks discussed:  Infection, pain, tendon damage, vascular damage, poor wound healing, poor cosmetic result, need for additional repair and nerve damage   Alternatives discussed:  No treatment Universal protocol:    Procedure explained and questions answered to patient or proxy's satisfaction: yes     Relevant documents present and verified: yes     Test results available and properly labeled: yes     Imaging studies  available: yes     Required blood products, implants, devices, and special equipment available: yes     Site/side marked: yes     Immediately prior to procedure, a time out was called: yes     Patient identity confirmed:  Arm band and provided demographic data Anesthesia (see MAR for exact dosages):    Anesthesia method:  Local infiltration   Local anesthetic:  Lidocaine 2% w/o epi Laceration details:    Location:  Face   Face location:  L upper eyelid   Extent:  Full thickness   Length (cm):  4   Depth (mm):  3 Repair type:    Repair type:  Simple Pre-procedure details:    Preparation:  Patient was prepped and draped in usual sterile fashion Exploration:    Hemostasis achieved with:  Direct pressure   Wound exploration: entire depth of wound probed and visualized     Wound extent: vascular damage     Wound extent: no foreign bodies/material noted, no tendon damage noted and no underlying fracture noted     Contaminated: no   Treatment:    Area cleansed with:  Betadine   Amount of cleaning:  Standard   Irrigation solution:  Sterile water and tap water   Irrigation volume:  10   Irrigation method:  Syringe   Visualized foreign bodies/material removed: no   Skin repair:    Repair method:   Sutures   Suture size:  6-0   Suture material:  Prolene   Number of sutures:  3 Approximation:    Approximation:  Close Post-procedure details:    Dressing:  Antibiotic ointment   Patient tolerance of procedure:  Tolerated well, no immediate complications   (including critical care time)  Medications Ordered in ED Medications  LORazepam (ATIVAN) 2 MG/ML injection (1 mg  Given 02/14/18 1217)  lidocaine (XYLOCAINE) 2 % injection (5 mLs  Given by Other 02/14/18 1300)  sodium chloride 0.9 % bolus 1,000 mL (0 mLs Intravenous Stopped 02/14/18 1426)  ondansetron (ZOFRAN) injection 4 mg (4 mg Intravenous Given 02/14/18 1401)     Initial Impression / Assessment and Plan / ED Course  I have reviewed the triage vital signs and the nursing notes.  Pertinent labs & imaging results that were available during my care of the patient were reviewed by me and considered in my medical decision making (see chart for details).   Update: Patient had a brief period of shaking, though was interactive, following commands during this episode, there was fianc voiced concern of possible ongoing seizure activity. Given the persistency of his interactivity, this seems unlikely.  With a history of seizure disorder presents after a witnessed seizure with fall. Patient awakens, moves all extremities spontaneously, is appropriately interactive, has no physical exam findings concerning for intra-cranial hemorrhage. Patient received IV Keppra here, was discharged with prescriptions and resources to facilitate medication compliance, including community clinic follow-up.  Final Clinical Impressions(s) / ED Diagnoses  Seizure fall, initial encounter Facial laceration   Carmin Muskrat, MD 02/14/18 2107

## 2018-02-14 NOTE — ED Notes (Signed)
Pt fiance staes pt c/o nausea. Pt states he has nausea. . edp aware

## 2018-02-14 NOTE — ED Notes (Signed)
Patient transported to X-ray 

## 2018-02-14 NOTE — ED Provider Notes (Signed)
Emergency Department Provider Note   I have reviewed the triage vital signs and the nursing notes.   HISTORY  Chief Complaint Seizures   HPI Charles Tyler is a 52 y.o. male with PMH of seizure activity, HLD, and depression/anxiety presents to the emergency department for evaluation of continued lower back pain with additional breakthrough seizure since emergency department discharge earlier today.  Patient has been on Keppra and Depakote since November of last year and is followed by [facility] neurology in [place].  He has been unable to fill his prescriptions or follow-up with his neurologist because of a loss of insurance.  He is currently waiting to establish disability and Medicaid.  He has been off of all antiepileptic drugs for the last 2.5 months.  His significant other at bedside states that he has nightly seizures in his sleep and daytime seizures every 1-2 weeks.  Denies any drug or alcohol use.  No fevers or chills.  He has had several seizures today which initially brought him to the emergency department.  He had an eyebrow laceration which was repaired and discharged home with resources to follow-up with a local free clinic who could help him afford medications.  Patient significant other states that he had an approximately 4-6-minute seizure at home and continues to complain of back pain so they return to the emergency department.   Past Medical History:  Diagnosis Date  . Depression with anxiety   . History of cardiac catheterization    Done in [place] - details not clear  . Hyperlipidemia   . Migraine   . Seizure ([place])   . Seizures [facility])     Patient Active Problem List   Diagnosis Date Noted  . PTSD (post-traumatic stress disorder) 09/14/2017  . MDD (major depressive disorder), recurrent episode, moderate ([place]) 09/14/2017  . Seizure ([place]) 09/10/2017  . Weight loss 09/10/2017  . New onset seizure ([place]) 08/25/2017  . Seizures ([place]) 08/25/2017  . Chest pain  08/22/2017  . History of coronary artery stent placement 08/22/2017  . Hyperlipidemia 08/22/2017  . Back pain 08/22/2017  . Syncope 08/22/2017    Past Surgical History:  Procedure Laterality Date  . CHOLECYSTECTOMY    . LEFT HEART CATH AND CORONARY ANGIOGRAPHY N/A 08/23/2017   Procedure: LEFT HEART CATH AND CORONARY ANGIOGRAPHY;  Surgeon: Belva Crome, MD;  Location: [facility];  Service: Cardiovascular;  Laterality: N/A;    Current Outpatient Rx  . Order #: [id number] Class: Print  . Order #: [id number] Class: Normal  . Order #: [id number] Class: Print  . Order #: [id number] Class: Print  . Order #: [id number] Class: Print  . Order #: [id number] Class: Print  . Order #: [id number] Class: Normal    Allergies Aspirin and Other  Family History  Problem Relation Age of Onset  . CAD Father        Premature CAD status post CABG  . Heart attack Father   . COPD Father   . CAD Paternal Grandfather        Premature CAD status post CABG  . Alcohol abuse Maternal Grandfather   . Heart disease Maternal Grandfather   . Alcohol abuse Maternal Grandmother   . COPD Mother     Social History Social History   Tobacco Use  . Smoking status: Former Smoker    Packs/day: 1.00    Types: Cigarettes    Last attempt to quit: 01/05/2018    Years since quitting: 0.1  . Smokeless tobacco: Never Used  Substance Use Topics  .  Alcohol use: Yes    Comment: rarely, 09-14-2017 per pt occas  . Drug use: No    Comment: 09-14-2017 per pt stopped 30 years ago - Cocaine, Marijuana and speed     Review of Systems  Constitutional: No fever/chills Eyes: No visual changes. ENT: No sore throat. Cardiovascular: Denies chest pain. Respiratory: Denies shortness of breath. Gastrointestinal: No abdominal pain.  No nausea, no vomiting.  No diarrhea.  No constipation. Genitourinary: Negative for dysuria. Musculoskeletal: Negative for back pain. Skin: Negative for rash. Neurological: Negative for headaches,  focal weakness or numbness. Positive breakthrough seizure.   10-point ROS otherwise negative.  ____________________________________________   PHYSICAL EXAM:  VITAL SIGNS: ED Triage Vitals  Enc Vitals Group     BP 02/14/18 2030 129/81     Pulse Rate 02/14/18 2030 79     Resp 02/14/18 2030 14     Temp 02/14/18 2030 98.5 F (36.9 C)     Temp Source 02/14/18 2030 Oral     SpO2 02/14/18 2030 94 %     Weight 02/14/18 2030 175 lb (79.4 kg)     Height 02/14/18 2030 5\' 7"  (1.702 m)     Pain Score 02/14/18 2033 9   Constitutional: Alert and oriented. Well appearing and in no acute distress. Eyes: Conjunctivae are normal. PERRL. EOMI. Head: Atraumatic. Nose: No congestion/rhinnorhea. Mouth/Throat: Mucous membranes are moist.  Oropharynx non-erythematous. Neck: No stridor.  Cardiovascular: Normal rate, regular rhythm. Good peripheral circulation. Grossly normal heart sounds.   Respiratory: Normal respiratory effort.  No retractions. Lungs CTAB. Gastrointestinal: Soft and nontender. No distention.  Musculoskeletal: No lower extremity tenderness nor edema. No gross deformities of extremities. Neurologic:  Normal speech and language. No gross focal neurologic deficits are appreciated.  Skin:  Skin is warm, dry and intact. No rash noted.  ____________________________________________   LABS (all labs ordered are listed, but only abnormal results are displayed)  Labs from earlier today reviewed. ____________________________________________  RADIOLOGY  Dg Thoracic Spine 2 View  Result Date: 02/14/2018 CLINICAL DATA:  52 y/o M; mid to lower back pain. Patient had a seizure today and fell. EXAM: THORACIC SPINE 2 VIEWS; LUMBAR SPINE - 2-3 VIEW COMPARISON:  09/01/2017 thoracic spine radiographs. 01/24/2018 lumbar spine radiographs. FINDINGS: Thoracic spine: There is no evidence of thoracic spine fracture. Alignment is normal. No other significant bone abnormalities are identified. Right  upper quadrant cholecystectomy clips. Lumbar spine: There is no evidence of thoracic spine fracture. Alignment is normal. Moderate L5-S1 loss of intervertebral disc space height. Lower lumbar facet arthrosis. No other significant bone abnormalities are identified. IMPRESSION: 1.  No acute bony or articular abnormality identified. 2. Stable moderate L5-S1 spondylosis. Electronically Signed   By: Kristine Garbe M.D.   On: 02/14/2018 21:54   Dg Lumbar Spine 2-3 Views  Result Date: 02/14/2018 CLINICAL DATA:  52 y/o M; mid to lower back pain. Patient had a seizure today and fell. EXAM: THORACIC SPINE 2 VIEWS; LUMBAR SPINE - 2-3 VIEW COMPARISON:  09/01/2017 thoracic spine radiographs. 01/24/2018 lumbar spine radiographs. FINDINGS: Thoracic spine: There is no evidence of thoracic spine fracture. Alignment is normal. No other significant bone abnormalities are identified. Right upper quadrant cholecystectomy clips. Lumbar spine: There is no evidence of thoracic spine fracture. Alignment is normal. Moderate L5-S1 loss of intervertebral disc space height. Lower lumbar facet arthrosis. No other significant bone abnormalities are identified. IMPRESSION: 1.  No acute bony or articular abnormality identified. 2. Stable moderate L5-S1 spondylosis. Electronically Signed   By: Mia Creek  [place] M.D.   On: 02/14/2018 21:54    ____________________________________________   PROCEDURES  Procedure(s) performed:   Procedures  None ____________________________________________   INITIAL IMPRESSION / ASSESSMENT AND PLAN / ED COURSE  Pertinent labs & imaging results that were available during my care of the patient were reviewed by me and considered in my medical decision making (see chart for details).  Presents to the emergency department for evaluation of continued lower back pain and seizure activity at home.  He has a normal EEG in November 2018 as well as a normal MRI.  He has been off of  medication for the past 2.5 months and was previously on Keppra and Depakote.  He is showing no stigmata of seizure on my exam.  He does have some midline thoracic and lumbar spine tenderness.  Plan for plain films of these areas.  I did call and discuss with the tele-neurologist on-call who recommends not starting new AEDs from the emergency department and advised that the patient follow-up with local social and medication assistance programs.  Also advised that he continue to not drive motor vehicle.   Plain films reviewed with no acute findings. Discussed the plan of Neurology follow up and provided multiple local contacts for medication assistance. After discussion with the patient regarding discharge he had another event of seizure-like activity described in nursing notes. Presentation not consistent with generalized tonic-clinic seizures. Will continue with plan as outlined in conjunction with teleneurology.   ____________________________________________  FINAL CLINICAL IMPRESSION(S) / ED DIAGNOSES  Final diagnoses:  Seizure-like activity ([place])  Acute midline low back pain without sciatica    MEDICATIONS GIVEN DURING THIS VISIT:  Medications  ibuprofen (ADVIL,MOTRIN) tablet 800 mg (800 mg Oral Given 02/14/18 2253)    Note:  This document was prepared using Dragon voice recognition software and may include unintentional dictation errors.  Nanda Quinton, MD Emergency Medicine    Dusti Tetro, [facility], MD 02/14/18 [phone]

## 2018-02-14 NOTE — ED Triage Notes (Signed)
PT c/o seizure this am and laceation to left side of face with bleeding controlled at this time and middle back pain. PT states current insurance issues and unable to get his Kepra and Depakote refilled.

## 2018-02-14 NOTE — ED Notes (Signed)
Family at bedside. 

## 2018-02-14 NOTE — ED Notes (Signed)
Pt has been off meds for months due to inability to pay

## 2018-02-14 NOTE — ED Notes (Signed)
Called to room reported that pt was having another seizure. Noted to be shaking involving all ext. Eyes were rapidly moving Resp 36. Dr Vanita Panda at bedside Gave verbal order to adm 1 mg Ativan

## 2018-02-14 NOTE — ED Notes (Signed)
Pt returned from X Ray.

## 2018-02-14 NOTE — Discharge Instructions (Signed)

## 2018-02-14 NOTE — Discharge Instructions (Signed)
As discussed, it is important he follow-up at our affiliated [facility] to assist with obtaining medication, and arranging appropriate outpatient follow-up, including assistance with your ongoing back pain.  Please take all medication as directed, return for concerning changes in your condition.  Stitches need to be removed in 6 days.

## 2018-02-15 ENCOUNTER — Other Ambulatory Visit: Payer: Self-pay

## 2018-02-15 ENCOUNTER — Encounter (HOSPITAL_COMMUNITY): Payer: Self-pay | Admitting: Emergency Medicine

## 2018-02-15 ENCOUNTER — Observation Stay (HOSPITAL_COMMUNITY)
Admission: EM | Admit: 2018-02-15 | Discharge: 2018-02-17 | Disposition: A | Discharge status: Home / Self Care | Payer: Medicaid Other | Attending: Internal Medicine | Admitting: Internal Medicine

## 2018-02-15 DIAGNOSIS — E876 Hypokalemia: Secondary | ICD-10-CM | POA: Insufficient documentation

## 2018-02-15 DIAGNOSIS — F329 Major depressive disorder, single episode, unspecified: Secondary | ICD-10-CM | POA: Insufficient documentation

## 2018-02-15 DIAGNOSIS — R569 Unspecified convulsions: Principal | ICD-10-CM

## 2018-02-15 DIAGNOSIS — Z87891 Personal history of nicotine dependence: Secondary | ICD-10-CM | POA: Insufficient documentation

## 2018-02-15 DIAGNOSIS — Y939 Activity, unspecified: Secondary | ICD-10-CM | POA: Diagnosis not present

## 2018-02-15 DIAGNOSIS — R55 Syncope and collapse: Secondary | ICD-10-CM | POA: Diagnosis present

## 2018-02-15 DIAGNOSIS — E785 Hyperlipidemia, unspecified: Secondary | ICD-10-CM | POA: Diagnosis present

## 2018-02-15 DIAGNOSIS — S0083XA Contusion of other part of head, initial encounter: Secondary | ICD-10-CM | POA: Insufficient documentation

## 2018-02-15 DIAGNOSIS — X58XXXA Exposure to other specified factors, initial encounter: Secondary | ICD-10-CM | POA: Insufficient documentation

## 2018-02-15 DIAGNOSIS — Y999 Unspecified external cause status: Secondary | ICD-10-CM | POA: Diagnosis not present

## 2018-02-15 DIAGNOSIS — Z79899 Other long term (current) drug therapy: Secondary | ICD-10-CM | POA: Insufficient documentation

## 2018-02-15 DIAGNOSIS — I2510 Atherosclerotic heart disease of native coronary artery without angina pectoris: Secondary | ICD-10-CM

## 2018-02-15 DIAGNOSIS — Y929 Unspecified place or not applicable: Secondary | ICD-10-CM | POA: Insufficient documentation

## 2018-02-15 DIAGNOSIS — Z720 Tobacco use: Secondary | ICD-10-CM

## 2018-02-15 DIAGNOSIS — F331 Major depressive disorder, recurrent, moderate: Secondary | ICD-10-CM | POA: Diagnosis present

## 2018-02-15 DIAGNOSIS — F4310 Post-traumatic stress disorder, unspecified: Secondary | ICD-10-CM | POA: Diagnosis present

## 2018-02-15 DIAGNOSIS — F418 Other specified anxiety disorders: Secondary | ICD-10-CM | POA: Diagnosis present

## 2018-02-15 HISTORY — DX: Atherosclerotic heart disease of native coronary artery without angina pectoris: I25.10

## 2018-02-15 LAB — COMPREHENSIVE METABOLIC PANEL
ALBUMIN: 3.8 g/dL (ref 3.5–5.0)
ALT: 33 U/L (ref 17–63)
ANION GAP: 7 (ref 5–15)
AST: 26 U/L (ref 15–41)
Alkaline Phosphatase: 52 U/L (ref 38–126)
BUN: 8 mg/dL (ref 6–20)
CALCIUM: 8.4 mg/dL — AB (ref 8.9–10.3)
CO2: 23 mmol/L (ref 22–32)
Chloride: 109 mmol/L (ref 101–111)
Creatinine, Ser: 0.91 mg/dL (ref 0.61–1.24)
GFR calc Af Amer: >60 mL/min (ref >60)
GFR calc non Af Amer: >60 mL/min (ref >60)
GLUCOSE: 117 mg/dL — AB (ref 65–99)
Potassium: 3.4 mmol/L — ABNORMAL LOW (ref 3.5–5.1)
SODIUM: 139 mmol/L (ref 135–145)
Total Bilirubin: 0.3 mg/dL (ref 0.3–1.2)
Total Protein: 6.2 g/dL — ABNORMAL LOW (ref 6.5–8.1)

## 2018-02-15 LAB — CBC WITH DIFFERENTIAL/PLATELET
Basophils Absolute: 0 10*3/uL (ref 0.0–0.1)
Basophils Relative: 0 %
Eosinophils Absolute: 0.4 10*3/uL (ref 0.0–0.7)
Eosinophils Relative: 6 %
HCT: 41.4 % (ref 39.0–52.0)
Hemoglobin: 14.2 g/dL (ref 13.0–17.0)
LYMPHS PCT: 24 %
Lymphs Abs: 1.7 10*3/uL (ref 0.7–4.0)
MCH: 31.5 pg (ref 26.0–34.0)
MCHC: 34.3 g/dL (ref 30.0–36.0)
MCV: 91.8 fL (ref 78.0–100.0)
MONO ABS: 0.5 10*3/uL (ref 0.1–1.0)
MONOS PCT: 7 %
NEUTROS ABS: 4.4 10*3/uL (ref 1.7–7.7)
Neutrophils Relative %: 63 %
Platelets: 160 10*3/uL (ref 150–400)
RBC: 4.51 MIL/uL (ref 4.22–5.81)
RDW: 14 % (ref 11.5–15.5)
WBC: 7 10*3/uL (ref 4.0–10.5)

## 2018-02-15 LAB — PHOSPHORUS: Phosphorus: 2.7 mg/dL (ref 2.5–4.6)

## 2018-02-15 LAB — MAGNESIUM: Magnesium: 2.1 mg/dL (ref 1.7–2.4)

## 2018-02-15 MED ORDER — IBUPROFEN 800 MG PO TABS: 800.0000 mg | ORAL_TABLET | Freq: Three times a day (TID) | ORAL | Status: DC | PRN

## 2018-02-15 MED ORDER — FLUOXETINE HCL 20 MG PO CAPS
20.0000 mg | ORAL_CAPSULE | Freq: Every day | ORAL | Status: DC
  Administered 2018-02-15 – 2018-02-17 (×3): 20 mg via ORAL
  Filled 2018-02-15 (×3): qty 1

## 2018-02-15 MED ORDER — LEVETIRACETAM 500 MG PO TABS
1500.0000 mg | ORAL_TABLET | Freq: Two times a day (BID) | ORAL | Status: DC
  Administered 2018-02-15 – 2018-02-17 (×4): 1500 mg via ORAL
  Filled 2018-02-15 (×4): qty 3

## 2018-02-15 MED ORDER — SODIUM CHLORIDE 0.9 % IV SOLN
INTRAVENOUS | Status: DC
  Administered 2018-02-15 – 2018-02-17 (×3): via INTRAVENOUS

## 2018-02-15 MED ORDER — KETOROLAC TROMETHAMINE 30 MG/ML IJ SOLN
30.0000 mg | Freq: Once | INTRAMUSCULAR | Status: AC
  Administered 2018-02-15: 30 mg via INTRAVENOUS
  Filled 2018-02-15: qty 1

## 2018-02-15 MED ORDER — METHOCARBAMOL 500 MG PO TABS: 1000.0000 mg | ORAL_TABLET | Freq: Four times a day (QID) | ORAL | Status: DC | PRN

## 2018-02-15 MED ORDER — POTASSIUM CHLORIDE CRYS ER 20 MEQ PO TBCR
40.0000 meq | EXTENDED_RELEASE_TABLET | Freq: Once | ORAL | Status: AC
  Administered 2018-02-15: 40 meq via ORAL
  Filled 2018-02-15: qty 2

## 2018-02-15 MED ORDER — HYDROCODONE-ACETAMINOPHEN 5-325 MG PO TABS: 1.0000 | ORAL_TABLET | Freq: Four times a day (QID) | ORAL | Status: DC | PRN

## 2018-02-15 MED ORDER — DIVALPROEX SODIUM ER 500 MG PO TB24
1000.0000 mg | ORAL_TABLET | Freq: Every day | ORAL | Status: DC
  Administered 2018-02-16 – 2018-02-17 (×2): 1000 mg via ORAL
  Filled 2018-02-15 (×2): qty 2

## 2018-02-15 NOTE — ED Notes (Signed)
Witnessed by this Probation officer, Pt had another episode of "quivering" all over, eyes closed, fluttering eyelids.  Episode lasted approx 1 minute and pt was able to open eyes and speak clearly, answer questions appropriately.   Pt was not injured in any way.  Pt asked for a warm blanket and for the lights to be turned down which was provided to the pt.  Family member is bedside

## 2018-02-15 NOTE — H&P (Signed)
History and Physical    Charles Tyler MWN:[id number] DOB: 27-Dec-1965 DOA: 02/15/2018  PCP: Rosita Fire, MD   Patient coming from: Home.  I have personally briefly reviewed patient's old medical records in [place]  Chief Complaint: Seizures.  HPI: Charles Tyler is a 52 y.o. male with medical history significant of CAD, history of stent placement in [place] in 2016, depression with anxiety, hyperlipidemia, migraine headaches, seizures versus pseudoseizures who is coming to the emergency department for the third time in the last 2 days due to seizure-like activity.  He denies aura, blurred vision or scotoma.  Complains of headache after having a fall and sustaining a left eyebrow contusion/laceration. No fever, chills, rhinorrhea, sore throat, dyspnea, wheezing, chest pain, palpitations, dizziness, diaphoresis, PND, orthopnea or pitting edema of the lower extremities.  No abdominal pain, diarrhea, nausea, emesis, constipation, melena or hematochezia.  Denies dysuria or frequency.  No polyphagia, polydipsia, polyuria.  Denies pruritus or skin rashes.  ED Course: Initial vital signs temperature 36.8C (98.2 F), pulse 65, respirations 18, blood pressure 127/74 and O2 sat 98% on room air.  His lab work shows a white count of 7.0 with a normal differential, hemoglobin 14.2 g/dL and platelets 160.  Sodium 139, potassium 3.4, chloride 109 CO2 23 mmol/L.  BUN 8, creatinine 0.91, glucose 117 calcium 8.4 mg/dL.  His total protein was 6.2 g/dL, but all other LFTs were within normal limits.  Imaging: He had a normal CT head on 01/24/2018.  Lumbar and thoracic spine x-ray done yesterday did not show any acute abnormality, however there was stable moderate L-S1 and spondylosis.  Please see images of full radiology report for further detail.  Review of Systems: As per HPI otherwise 10 point review of systems negative.    Past Medical History:  Diagnosis Date  . Depression with anxiety    . History of cardiac catheterization    Done in [place] - details not clear  . Hyperlipidemia   . Migraine   . Seizure ([place])   . Seizures ([place])     Past Surgical History:  Procedure Laterality Date  . CHOLECYSTECTOMY    . LEFT HEART CATH AND CORONARY ANGIOGRAPHY N/A 08/23/2017   Procedure: LEFT HEART CATH AND CORONARY ANGIOGRAPHY;  Surgeon: Belva Crome, MD;  Location: [facility];  Service: Cardiovascular;  Laterality: N/A;     reports that he quit smoking about 5 weeks ago. His smoking use included cigarettes. He smoked 1.00 pack per day. He has never used smokeless tobacco. He reports that he drinks alcohol. He reports that he does not use drugs.  Allergies  Allergen Reactions  . Aspirin Nausea And Vomiting    325 mg   . Other Hives    Walnuts    Family History  Problem Relation Age of Onset  . CAD Father        Premature CAD status post CABG  . Heart attack Father   . COPD Father   . CAD Paternal Grandfather        Premature CAD status post CABG  . Alcohol abuse Maternal Grandfather   . Heart disease Maternal Grandfather   . Alcohol abuse Maternal Grandmother   . COPD Mother     Prior to Admission medications   Medication Sig Start Date End Date Taking? Authorizing Provider  divalproex (DEPAKOTE ER) 500 MG 24 hr tablet Take 2 tablets (1,000 mg total) by mouth daily. 02/14/18  Yes Carmin Muskrat, MD  FLUoxetine (PROZAC) 20 MG  capsule Take 1 capsule (20 mg total) by mouth daily. 01/11/18  Yes Norman Clay, MD  HYDROcodone-acetaminophen (NORCO/VICODIN) 5-325 MG tablet Take 1 tablet by mouth every 6 (six) hours as needed for moderate pain. 01/24/18  Yes Milton Ferguson, MD  ibuprofen (ADVIL,MOTRIN) 800 MG tablet Take 1 tablet (800 mg total) by mouth every 8 (eight) hours as needed. 02/14/18  Yes Long, Wonda Olds, MD  levETIRAcetam (KEPPRA) 750 MG tablet Take 2 tablets (1,500 mg total) by mouth 2 (two) times daily. 02/14/18  Yes Carmin Muskrat, MD  methocarbamol  (ROBAXIN) 500 MG tablet Take 1 or 2 po Q 6hrs for muscle pain Patient taking differently: Take 500-1,000 mg by mouth every 6 (six) hours as needed for muscle spasms.  02/14/18   Carmin Muskrat, MD  mirtazapine (REMERON) 30 MG tablet Take 1 tablet (30 mg total) by mouth at bedtime. Patient not taking: Reported on 02/14/2018 01/11/18   Norman Clay, MD    Physical Exam: Vitals:   02/15/18 2000 02/15/18 2030 02/15/18 2100 02/15/18 2130  BP: 112/69 119/69 114/70 113/66  Pulse: 64 (!) 58 61 (!) 58  Resp: 15 17 10 18   Temp:      TempSrc:      SpO2: 95% 96% 98% 97%  Weight:        Constitutional: NAD, calm, comfortable Eyes: PERRL, lids and conjunctivae normal.  Left eyebrow laceration. ENMT: Mucous membranes are moist. Posterior pharynx clear of any exudate or lesions. Neck: normal, supple, no masses, no thyromegaly Respiratory: clear to auscultation bilaterally, no wheezing, no crackles. Normal respiratory effort. No accessory muscle use.  Cardiovascular: Regular rate and rhythm, no murmurs / rubs / gallops. No extremity edema. 2+ pedal pulses. No carotid bruits.  Abdomen: Soft, no tenderness, no masses palpated. No hepatosplenomegaly. Bowel sounds positive.  Musculoskeletal: no clubbing / cyanosis. Good ROM, no contractures. Normal muscle tone.  Skin: Left eyebrow laceration.  See picture below. Neurologic: CN 2-12 grossly intact. Sensation intact, DTR normal. Strength 5/5 in all 4.  Psychiatric: Normal judgment and insight. Alert and oriented x 3. Normal mood.    Left eyebrow laceration.  Labs on Admission: I have personally reviewed following labs and imaging studies  CBC: Recent Labs  Lab 02/15/18 2023  WBC 7.0  NEUTROABS 4.4  HGB 14.2  HCT 41.4  MCV 91.8  PLT 332   Basic Metabolic Panel: Recent Labs  Lab 02/15/18 2023  NA 139  K 3.4*  CL 109  CO2 23  GLUCOSE 117*  BUN 8  CREATININE 0.91  CALCIUM 8.4*   GFR: Estimated Creatinine Clearance: 97 mL/min (by C-G  formula based on SCr of 0.91 mg/dL). Liver Function Tests: Recent Labs  Lab 02/15/18 2023  AST 26  ALT 33  ALKPHOS 52  BILITOT 0.3  PROT 6.2*  ALBUMIN 3.8   No results for input(s): LIPASE, AMYLASE in the last 168 hours. No results for input(s): AMMONIA in the last 168 hours. Coagulation Profile: No results for input(s): INR, PROTIME in the last 168 hours. Cardiac Enzymes: No results for input(s): CKTOTAL, CKMB, CKMBINDEX, TROPONINI in the last 168 hours. BNP (last 3 results) No results for input(s): PROBNP in the last 8760 hours. HbA1C: No results for input(s): HGBA1C in the last 72 hours. CBG: No results for input(s): GLUCAP in the last 168 hours. Lipid Profile: No results for input(s): CHOL, HDL, LDLCALC, TRIG, CHOLHDL, LDLDIRECT in the last 72 hours. Thyroid Function Tests: No results for input(s): TSH, T4TOTAL, FREET4, T3FREE, THYROIDAB in  the last 72 hours. Anemia Panel: No results for input(s): VITAMINB12, FOLATE, FERRITIN, TIBC, IRON, RETICCTPCT in the last 72 hours. Urine analysis:    Component Value Date/Time   COLORURINE YELLOW 08/22/2017 1656   APPEARANCEUR CLEAR 08/22/2017 1656   LABSPEC 1.017 08/22/2017 1656   PHURINE 6.0 08/22/2017 1656   GLUCOSEU NEGATIVE 08/22/2017 1656   HGBUR NEGATIVE 08/22/2017 1656   BILIRUBINUR NEGATIVE 08/22/2017 1656   KETONESUR NEGATIVE 08/22/2017 1656   PROTEINUR NEGATIVE 08/22/2017 1656   NITRITE NEGATIVE 08/22/2017 1656   LEUKOCYTESUR NEGATIVE 08/22/2017 1656    Radiological Exams on Admission: Dg Thoracic Spine 2 View  Result Date: 02/14/2018 CLINICAL DATA:  52 y/o M; mid to lower back pain. Patient had a seizure today and fell. EXAM: THORACIC SPINE 2 VIEWS; LUMBAR SPINE - 2-3 VIEW COMPARISON:  09/01/2017 thoracic spine radiographs. 01/24/2018 lumbar spine radiographs. FINDINGS: Thoracic spine: There is no evidence of thoracic spine fracture. Alignment is normal. No other significant bone abnormalities are identified.  Right upper quadrant cholecystectomy clips. Lumbar spine: There is no evidence of thoracic spine fracture. Alignment is normal. Moderate L5-S1 loss of intervertebral disc space height. Lower lumbar facet arthrosis. No other significant bone abnormalities are identified. IMPRESSION: 1.  No acute bony or articular abnormality identified. 2. Stable moderate L5-S1 spondylosis. Electronically Signed   By: Kristine Garbe M.D.   On: 02/14/2018 21:54   Dg Lumbar Spine 2-3 Views  Result Date: 02/14/2018 CLINICAL DATA:  52 y/o M; mid to lower back pain. Patient had a seizure today and fell. EXAM: THORACIC SPINE 2 VIEWS; LUMBAR SPINE - 2-3 VIEW COMPARISON:  09/01/2017 thoracic spine radiographs. 01/24/2018 lumbar spine radiographs. FINDINGS: Thoracic spine: There is no evidence of thoracic spine fracture. Alignment is normal. No other significant bone abnormalities are identified. Right upper quadrant cholecystectomy clips. Lumbar spine: There is no evidence of thoracic spine fracture. Alignment is normal. Moderate L5-S1 loss of intervertebral disc space height. Lower lumbar facet arthrosis. No other significant bone abnormalities are identified. IMPRESSION: 1.  No acute bony or articular abnormality identified. 2. Stable moderate L5-S1 spondylosis. Electronically Signed   By: Kristine Garbe M.D.   On: 02/14/2018 21:54   08/23/2017 echocardiogram complete ------------------------------------------------------------------- LV EF: 60% -   65%  ------------------------------------------------------------------- Indications:      Chest pain 786.51.  ------------------------------------------------------------------- History:   PMH:  Former Smoker.  Syncope.  Angina pectoris.  Risk factors:  Dyslipidemia.  ------------------------------------------------------------------- Study Conclusions  - Left ventricle: The cavity size was normal. Wall thickness was   normal. Systolic function was  normal. The estimated ejection   fraction was in the range of 60% to 65%. Wall motion was normal;   there were no regional wall motion abnormalities. Left   ventricular diastolic function parameters were normal. - Aortic valve: Mildly calcified annulus. Trileaflet. - Mitral valve: There was trivial regurgitation. 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 - Atrial septum: No defect or patent foramen ovale was identified. - Tricuspid valve: There was trivial regurgitation. - Pulmonary arteries: Systolic pressure could not be accurately   estimated. - Pericardium, extracardiac: There was no pericardial effusion.  Impressions:  - Normal LV wall thickness with LVEF 60-65% and normal diastolic   function. Trivial mitral regurgitation. Mildly calcified aortic   annulus. Trivial tricuspid regurgitation.  08/23/2017  LEFT HEART CATH AND CORONARY ANGIOGRAPHY    Conclusion     Mid LAD lesion, 25 %stenosed.  Ost 1st Diag lesion, 50 %stenosed.  The left  ventricular systolic function is normal.  LV end diastolic pressure is normal.  The left ventricular ejection fraction is 50-55% by visual estimate.    Right dominant coronary anatomy.  Minimal luminal irregularities noted in LAD and first diagonal.  Normal LV function and end-diastolic pressure. EF estimated to be 55%.  RECOMMENDATIONS:   No significant obstructive coronary disease. Normal LV function and hemodynamics.  Further evaluation for chest pain and syncope per treating team.   08/25/2017 EEG  EEG Report  Clinical History:  Recurrent "Seizure  Type" activity.  He has a history of depression and suicidal attempts.  Technical Summary:  A 19 channel digital EEG recording was performed using the 10-20 international system of electrode placement.  Bipolar and Referential montages were used.  The total recording time was approx 20 minutes.  Findings:  There is a very well developed and  regulated posterior dominant rhythm of 9 Hz reactive to eye opening and closure.  No focal slowing is present.  Photic stimulation does not produce a driving response and does not elicit any abnormalities.  Hyperventilation does not produce an effect on the resting record and does not elicit any abnormalities.  Sleep and drowsiness are not recorded. Throughout the recording there are no epileptiform discharges or electrographic seizures present.   The patient has a brief period of unresponsiveness and mild shaking which is not associated with EEG changes.    Impression:  This is a normal EEG in the awake state.  This does not rule out underlying epilepsy.  If clinical suspicion remains high, then a more prolonged and/or sleep deprived EEG may be of additional value.  One event during the recording was non-epileptic in nature.  Clinical correlation is advised.    09/12/2017 overnight EEG with video  DOB: Dec 10, 1965 Age: 79 Room#3W05  MED REC NO: [id number] Gender: Male   TECH: [place]     Physician: Winfield Cunas     Referring Physician: Roland Rack     Report Date: 09/12/2017       Study Duration:         09/11/2017 11:00 to 09/12/2017 07:30 CPT Code:                  42683 Diagnosis:                  Spells (R56.9)  History: This is a 52 year old male presenting with seizure-like spells.  Video-EEG monitoring was performed to evaluate for seizures.  Technical Details:  Long-term video-EEG monitoring was performed using standard setting per the guidelines.  Briefly, a minimum of 21 electrodes were placed on scalp according to the International 10-20 or/and 10-10 Systems.  Supplemental electrodes were placed as needed.  Single EKG electrode was also used to detect cardiac arrhythmia.  Patient's behavior was continuously recorded on video simultaneously with EEG.  A minimum of 16 channels were used for data display.  Each epoch of study was reviewed manually daily and as  needed using standard referential and bipolar montages.    EEG Description:  During wakefulness, there was a bilateral reactive 10 Hz posterior dominant rhythm.  During drowsiness, slow roving eye movement, vertex waves and attenuation of background and were seen.  K complexes and spindles were present in stage II sleep.  No focal, lateralized or periodic abnormality was seen.  No epileptiform discharges or seizures were in evidence.  No spell was captured.  Impression:  This is a normal awake and asleep EEG.  Normal EEG  does not support nor exclude a diagnosis of epilepsy.  Reading Physician: Winfield Cunas, MD, PhD   Rogue Jury, MS, MD EKG: Independently reviewed.  Vent. rate 69 BPM PR interval * ms QRS duration 101 ms QT/QTc 365/391 ms P-R-T axes 39 -20 34 Sinus rhythm Borderline left axis deviation  Assessment/Plan Principal Problem:   Seizure-like activity (HCC) The patient has had 2 normal EEGs in the past 6 months. He was last seen by neurology in December. No definite diagnosis was reached at that time. Observation/telemetry. Seizure precautions. Resume Keppra and valproic acid. Check valproic acid level in the morning. Consult neurology later today.  Active Problems:   Hyperlipidemia Currently not on medical treatment.    PTSD (post-traumatic stress disorder)   MDD (major depressive disorder), recurrent episode, moderate (HCC)   Depression with anxiety Continue Depakote 1000 mg p.o. twice daily. Continue Prozac 20 mg p.o. daily.    Coronary artery disease Asymptomatic at this time. Not on aspirin, statin or beta-blocker. She will consider starting Plavix.    DVT prophylaxis: SCDs. Code Status: Full code. Family Communication:  Disposition Plan: Observation, resume antiepileptic medications and routine neurology consult. Consults called: Routine neurology consult. Admission status: Observation/telemetry.   Reubin Milan MD [facility] Pager [phone].  If 7PM-7AM, please contact night-coverage [email or web address] Password [facility]  02/15/2018, 9:58 PM

## 2018-02-15 NOTE — ED Triage Notes (Signed)
Pt brought in POV c/o seizure, per SO pt had 5 seizures in 10 minutes. This RN saw pt shaking in wheelchair with eyes closed upon checking in, episode lasted approx 30 seconds. Appears post ictal, no tongue biting or incontinence noted. Pt was seen for the same yesterday.

## 2018-02-15 NOTE — ED Provider Notes (Signed)
[facility] EMERGENCY DEPARTMENT Provider Note   CSN: [id number] Arrival date & time: 02/15/18  1934     History   Chief Complaint Chief Complaint  Patient presents with  . Seizures    HPI Charles Tyler is a 51 y.o. male. Level 5 caveat due to altered mental status. HPI Patient presents after potential seizure activity.  Seen twice yesterday for the same.  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  Has had admissions to the hospital for the same.  Has had MRIs and EEGs that have been negative.  Reportedly had multiple seizures today.  Although patient can really not talk to me at this time Past Medical History:  Diagnosis Date  . Depression with anxiety   . History of cardiac catheterization    Done in [place] - details not clear  . Hyperlipidemia   . Migraine   . Seizure ([place])   . Seizures [facility])     Patient Active Problem List   Diagnosis Date Noted  . Seizure-like activity ([place]) 02/15/2018  . Depression with anxiety 02/15/2018  . PTSD (post-traumatic stress disorder) 09/14/2017  . MDD (major depressive disorder), recurrent episode, moderate ([place]) 09/14/2017  . Seizure ([place]) 09/10/2017  . Weight loss 09/10/2017  . New onset seizure ([place]) 08/25/2017  . Seizures ([place]) 08/25/2017  . Chest pain 08/22/2017  . History of coronary artery stent placement 08/22/2017  . Hyperlipidemia 08/22/2017  . Back pain 08/22/2017  . Syncope 08/22/2017    Past Surgical History:  Procedure Laterality Date  . CHOLECYSTECTOMY    . LEFT HEART CATH AND CORONARY ANGIOGRAPHY N/A 08/23/2017   Procedure: LEFT HEART CATH AND CORONARY ANGIOGRAPHY;  Surgeon: Belva Crome, MD;  Location: [facility];  Service: Cardiovascular;  Laterality: N/A;        Home Medications    Prior to Admission medications   Medication Sig Start Date End Date Taking? Authorizing Provider  divalproex (DEPAKOTE ER) 500 MG 24 hr tablet Take 2 tablets (1,000 mg total) by mouth daily. 02/14/18  Yes  Carmin Muskrat, MD  FLUoxetine (PROZAC) 20 MG capsule Take 1 capsule (20 mg total) by mouth daily. 01/11/18  Yes Norman Clay, MD  HYDROcodone-acetaminophen (NORCO/VICODIN) 5-325 MG tablet Take 1 tablet by mouth every 6 (six) hours as needed for moderate pain. 01/24/18  Yes Milton Ferguson, MD  ibuprofen (ADVIL,MOTRIN) 800 MG tablet Take 1 tablet (800 mg total) by mouth every 8 (eight) hours as needed. 02/14/18  Yes Long, Wonda Olds, MD  levETIRAcetam (KEPPRA) 750 MG tablet Take 2 tablets (1,500 mg total) by mouth 2 (two) times daily. 02/14/18  Yes Carmin Muskrat, MD  methocarbamol (ROBAXIN) 500 MG tablet Take 1 or 2 po Q 6hrs for muscle pain Patient taking differently: Take 500-1,000 mg by mouth every 6 (six) hours as needed for muscle spasms.  02/14/18   Carmin Muskrat, MD  mirtazapine (REMERON) 30 MG tablet Take 1 tablet (30 mg total) by mouth at bedtime. Patient not taking: Reported on 02/14/2018 01/11/18   Norman Clay, MD    Family History Family History  Problem Relation Age of Onset  . CAD Father        Premature CAD status post CABG  . Heart attack Father   . COPD Father   . CAD Paternal Grandfather        Premature CAD status post CABG  . Alcohol abuse Maternal Grandfather   . Heart disease Maternal Grandfather   . Alcohol abuse Maternal Grandmother   . COPD  Mother     Social History Social History   Tobacco Use  . Smoking status: Former Smoker    Packs/day: 1.00    Types: Cigarettes    Last attempt to quit: 01/05/2018    Years since quitting: 0.1  . Smokeless tobacco: Never Used  Substance Use Topics  . Alcohol use: Yes    Comment: rarely, 09-14-2017 per pt occas  . Drug use: No    Comment: 09-14-2017 per pt stopped 30 years ago - Cocaine, Marijuana and speed      Allergies   Aspirin and Other   Review of Systems Review of Systems  Unable to perform ROS: Mental status change     Physical Exam Updated Vital Signs BP 111/61   Pulse (!) 59   Temp 98.2 F  (36.8 C) (Oral)   Resp 20   Wt 79.4 kg (175 lb)   SpO2 96%   BMI 27.41 kg/m   Physical Exam  Constitutional: He appears well-developed.  HENT:  Head: Atraumatic.  Neck: Neck supple.  Cardiovascular: Normal rate.  Pulmonary/Chest: Effort normal.  Abdominal: There is no tenderness.  Musculoskeletal: He exhibits no edema.  Neurological:  Patient look to voice but is having some difficulty.  To focus on my face.  Will answer some questions.  Skin: Skin is warm. Capillary refill takes less than 2 seconds.     ED Treatments / Results  Labs (all labs ordered are listed, but only abnormal results are displayed) Labs Reviewed  COMPREHENSIVE METABOLIC PANEL - Abnormal; Notable for the following components:      Result Value   Potassium 3.4 (*)    Glucose, Bld 117 (*)    Calcium 8.4 (*)    Total Protein 6.2 (*)    All other components within normal limits  CBC WITH DIFFERENTIAL/PLATELET  PROLACTIN  MAGNESIUM  PHOSPHORUS  BASIC METABOLIC PANEL    EKG EKG Interpretation  Date/Time:  Thursday February 15 2018 19:45:15 EDT Ventricular Rate:  69 PR Interval:    QRS Duration: 101 QT Interval:  365 QTC Calculation: 391 R Axis:   -20 Text Interpretation:  Sinus rhythm Borderline left axis deviation Confirmed by Davonna Belling [phone]) on 02/15/2018 9:55:49 PM   Radiology Dg Thoracic Spine 2 View  Result Date: 02/14/2018 CLINICAL DATA:  52 y/o M; mid to lower back pain. Patient had a seizure today and fell. EXAM: THORACIC SPINE 2 VIEWS; LUMBAR SPINE - 2-3 VIEW COMPARISON:  09/01/2017 thoracic spine radiographs. 01/24/2018 lumbar spine radiographs. FINDINGS: Thoracic spine: There is no evidence of thoracic spine fracture. Alignment is normal. No other significant bone abnormalities are identified. Right upper quadrant cholecystectomy clips. Lumbar spine: There is no evidence of thoracic spine fracture. Alignment is normal. Moderate L5-S1 loss of intervertebral disc space height.  Lower lumbar facet arthrosis. No other significant bone abnormalities are identified. IMPRESSION: 1.  No acute bony or articular abnormality identified. 2. Stable moderate L5-S1 spondylosis. Electronically Signed   By: Kristine Garbe M.D.   On: 02/14/2018 21:54   Dg Lumbar Spine 2-3 Views  Result Date: 02/14/2018 CLINICAL DATA:  52 y/o M; mid to lower back pain. Patient had a seizure today and fell. EXAM: THORACIC SPINE 2 VIEWS; LUMBAR SPINE - 2-3 VIEW COMPARISON:  09/01/2017 thoracic spine radiographs. 01/24/2018 lumbar spine radiographs. FINDINGS: Thoracic spine: There is no evidence of thoracic spine fracture. Alignment is normal. No other significant bone abnormalities are identified. Right upper quadrant cholecystectomy clips. Lumbar spine: There is no evidence of  thoracic spine fracture. Alignment is normal. Moderate L5-S1 loss of intervertebral disc space height. Lower lumbar facet arthrosis. No other significant bone abnormalities are identified. IMPRESSION: 1.  No acute bony or articular abnormality identified. 2. Stable moderate L5-S1 spondylosis. Electronically Signed   By: Kristine Garbe M.D.   On: 02/14/2018 21:54    Procedures Procedures (including critical care time)  Medications Ordered in ED Medications  potassium chloride SA (K-DUR,KLOR-CON) CR tablet 40 mEq (has no administration in time range)  0.9 %  sodium chloride infusion (has no administration in time range)     Initial Impression / Assessment and Plan / ED Course  I have reviewed the triage vital signs and the nursing notes.  Pertinent labs & imaging results that were available during my care of the patient were reviewed by me and considered in my medical decision making (see chart for details).     Patient with questionable seizures.  History of seizure like activity with question of epileptic versus pseudoseizures.  After discussing with fianc is reportedly had numerous episodes today.  Has  been off medications for 2-3 months.  Discussed with tele-neurology.  States these likely pseudoseizure but EEG would help if they can capture 1 of the events.  Has had negative EEGs but not had any of the events.  Discussed with Dr. Olevia Bowens and will admit patient.  I doubt this is a status epilepticus.  Final Clinical Impressions(s) / ED Diagnoses   Final diagnoses:  Seizure-like activity [facility])    ED Discharge Orders    None       Davonna Belling, MD 02/15/18 2208

## 2018-02-16 ENCOUNTER — Encounter (HOSPITAL_COMMUNITY): Payer: Self-pay | Admitting: Internal Medicine

## 2018-02-16 ENCOUNTER — Observation Stay (HOSPITAL_BASED_OUTPATIENT_CLINIC_OR_DEPARTMENT_OTHER)
Admit: 2018-02-16 | Discharge: 2018-02-16 | Disposition: A | Discharge status: Home / Self Care | Payer: Medicaid Other | Attending: Internal Medicine | Admitting: Internal Medicine

## 2018-02-16 ENCOUNTER — Observation Stay (HOSPITAL_COMMUNITY): Payer: Medicaid Other

## 2018-02-16 DIAGNOSIS — I2510 Atherosclerotic heart disease of native coronary artery without angina pectoris: Secondary | ICD-10-CM | POA: Diagnosis not present

## 2018-02-16 DIAGNOSIS — Z720 Tobacco use: Secondary | ICD-10-CM | POA: Diagnosis not present

## 2018-02-16 DIAGNOSIS — R55 Syncope and collapse: Secondary | ICD-10-CM | POA: Diagnosis not present

## 2018-02-16 DIAGNOSIS — F331 Major depressive disorder, recurrent, moderate: Secondary | ICD-10-CM | POA: Diagnosis not present

## 2018-02-16 DIAGNOSIS — R569 Unspecified convulsions: Secondary | ICD-10-CM | POA: Diagnosis not present

## 2018-02-16 DIAGNOSIS — F418 Other specified anxiety disorders: Secondary | ICD-10-CM | POA: Diagnosis not present

## 2018-02-16 LAB — BASIC METABOLIC PANEL
Anion gap: 7 (ref 5–15)
BUN: 8 mg/dL (ref 6–20)
CHLORIDE: 110 mmol/L (ref 101–111)
CO2: 23 mmol/L (ref 22–32)
Calcium: 8.4 mg/dL — ABNORMAL LOW (ref 8.9–10.3)
Creatinine, Ser: 0.75 mg/dL (ref 0.61–1.24)
GFR calc Af Amer: >60 mL/min (ref >60)
GLUCOSE: 100 mg/dL — AB (ref 65–99)
POTASSIUM: 3.8 mmol/L (ref 3.5–5.1)
Sodium: 140 mmol/L (ref 135–145)

## 2018-02-16 LAB — RAPID URINE DRUG SCREEN, HOSP PERFORMED
AMPHETAMINES: NOT DETECTED
BARBITURATES: NOT DETECTED
BENZODIAZEPINES: NOT DETECTED
COCAINE: NOT DETECTED
Opiates: NOT DETECTED
TETRAHYDROCANNABINOL: POSITIVE — AB

## 2018-02-16 LAB — URINALYSIS, COMPLETE (UACMP) WITH MICROSCOPIC
BILIRUBIN URINE: NEGATIVE
GLUCOSE, UA: NEGATIVE mg/dL
HGB URINE DIPSTICK: NEGATIVE
Ketones, ur: NEGATIVE mg/dL
Leukocytes, UA: NEGATIVE
Nitrite: NEGATIVE
PROTEIN: NEGATIVE mg/dL
Specific Gravity, Urine: 1.019 (ref 1.005–1.030)
pH: 6 (ref 5.0–8.0)

## 2018-02-16 LAB — TSH: TSH: 2.497 u[IU]/mL (ref 0.350–4.500)

## 2018-02-16 LAB — CK: Total CK: 122 U/L (ref 49–397)

## 2018-02-16 LAB — GLUCOSE, CAPILLARY: GLUCOSE-CAPILLARY: 97 mg/dL (ref 65–99)

## 2018-02-16 LAB — T4, FREE: Free T4: 0.63 ng/dL (ref 0.61–1.12)

## 2018-02-16 MED ORDER — ONDANSETRON HCL 4 MG/2ML IJ SOLN
4.0000 mg | Freq: Four times a day (QID) | INTRAMUSCULAR | Status: DC | PRN
  Administered 2018-02-16: 4 mg via INTRAVENOUS
  Filled 2018-02-16: qty 2

## 2018-02-16 MED ORDER — ACETAMINOPHEN 325 MG PO TABS
650.0000 mg | ORAL_TABLET | Freq: Four times a day (QID) | ORAL | Status: DC | PRN
  Administered 2018-02-16: 650 mg via ORAL
  Filled 2018-02-16: qty 2

## 2018-02-16 NOTE — ACP (Advance Care Planning) (Signed)
At patient's request I shared Advance Directive's information. We discussed what was needed. He planned to review and discuss. Gave him contact information for completion when he is ready.

## 2018-02-16 NOTE — Plan of Care (Signed)
progressing 

## 2018-02-16 NOTE — Progress Notes (Signed)
PROGRESS NOTE  Charles Tyler ONG:[id number] DOB: 11-08-1965 DOA: 02/15/2018 PCP: Rosita Fire, MD  Brief History:  52 year old male with a history of major depression, hyperlipidemia, coronary artery disease, and seizure-like episodes presenting with increased frequency of seizure-like episodes over the last 2 days.  Patient states that he was initially diagnosed with "seizures"back in November 2018, but felt like he has been having seizures for much longer than that because of recurrent history of syncopal episodes.  He has had a negative cardiac workup for his syncope in the past.  Nevertheless, the patient was in the emergency department on 2 separate occasions on 02/14/2018 secondary to seizure-like activity.  The patient had another spell in the morning of 02/15/2018 resulting in another emergency department visit.  The patient was initially admitted to the hospital from 08/25/17 through 08/26/17 for seizure-like activity.  He was discharged with Keppra 500 mg twice daily.  The patient was admitted again to the hospital from 09/10/17 through 09/13/17 for seizure-like activity.  During that admission, the patient had overnight EEG monitoring which was normal.  He did not have any spells at that time.  The patient was discharged home with Keppra 1000 mg twice daily.   He subsequently followed up with neurology, Dr. [name], on 10/25/2017.  At that time, Depakote 500 mg twice daily was added, and his Keppra was increased to 1500 mg twice daily.  However, the patient lost his medical insurance soon after that visit.  Subsequently, he has been off his antiepileptic medications for the last 2-3 months.  He is currently seeking to get onto disability and Medicaid.  When asked about his seizure activity, the patient is very nondescript and apathetic.  He states that he does not remember having the seizures, but his multiple seizures have been witnessed by his significant other.  His spells have been  previously described as body stiffness, body tremors, and disorientation.  His significant other is also witnessed nighttime seizure-like activity with the patient's body shaking (R>L), body stiffness, and waking up confused.  There is no bowel or bladder incontinence nor does the patient bite his tongue.  In the emergency department, the patient was afebrile hemodynamically stable saturating 97% on room air.  BMP, LFTs, and CBC were essentially unremarkable.  The patient was admitted for observation and further workup.  Assessment/Plan: Seizure-like activity -Repeat EEG -Check CPK -Urine drug screen -CT brain as the patient had a recent fall from his seizure-like activity -No seizure-like activity since admission -Check TSH -Restart Keppra and Depakote  Facial contusion -CT brain  Major depression/PTSD -Continue fluoxetine  History of polysubstance abuse -Patient is sustained remission -endorsed use of THC currently  Coronary artery disease -08/23/2017 heart catheterization--Dr. Mallie Mussel Smith--minimal CAD, no mention of stents -Continue aspirin 81 mg daily -Continue risk factor modification -No chest pain presently  Tobacco abuse -States that he quit smoking 4 months ago I have discussed tobacco cessation with the patient.  I have counseled the patient regarding the negative impacts of continued tobacco use including but not limited to lung cancer, COPD, and cardiovascular disease.  I have discussed alternatives to tobacco and modalities that may help facilitate tobacco cessation including but not limited to biofeedback, hypnosis, and medications.  Total time spent with tobacco counseling was 4 minutes.  Hypokalemia -repleted    Disposition Plan:   Home 4/13 if stable and no seizure like activity Family Communication:  No Family at bedside--Total time spent 45 minutes.  Greater than 50% spent face to face counseling and coordinating care.   Consultants:  neurology  Code  Status:  FULL  DVT Prophylaxis:  SCDs   Procedures: As Listed in Progress Note Above  Antibiotics: None    Subjective: Patient denies fevers, chills, headache, chest pain, dyspnea, nausea, vomiting, diarrhea, abdominal pain, dysuria, hematuria, hematochezia, and melena.   Objective: Vitals:   02/15/18 2130 02/15/18 2200 02/15/18 2310 02/16/18 0623  BP: 113/66 111/61 126/79 113/73  Pulse: (!) 58 (!) 59 (!) 56 (!) 57  Resp: 18 20 18 16   Temp:   97.7 F (36.5 C) 98.1 F (36.7 C)  TempSrc:   Oral Oral  SpO2: 97% 96% 96% 97%  Weight:   83.3 kg (183 lb 10.3 oz)   Height:   5\' 7"  (1.702 m)     Intake/Output Summary (Last 24 hours) at 02/16/2018 0713 Last data filed at 02/16/2018 0500 Gross per 24 hour  Intake 793.33 ml  Output -  Net 793.33 ml   Weight change:  Exam:   General:  Pt is alert, follows commands appropriately, not in acute distress  HEENT: No icterus, No thrush, No neck mass, [place]/AT  Cardiovascular: RRR, S1/S2, no rubs, no gallops  Respiratory: CTA bilaterally, no wheezing, no crackles, no rhonchi  Abdomen: Soft/+BS, non tender, non distended, no guarding  Extremities: No edema, No lymphangitis, No petechiae, No rashes, no synovitis  Neuro:  CN II-XII intact, strength 4/5 in RUE, RLE, strength 4/5 LUE, LLE; sensation intact bilateral; no dysmetria; babinski equivocal--facial ecchymosis left periorbital area   Data Reviewed: I have personally reviewed following labs and imaging studies Basic Metabolic Panel: Recent Labs  Lab 02/15/18 2023 02/16/18 0618  NA 139 140  K 3.4* 3.8  CL 109 110  CO2 23 23  GLUCOSE 117* 100*  BUN 8 8  CREATININE 0.91 0.75  CALCIUM 8.4* 8.4*  MG 2.1  --   PHOS 2.7  --    Liver Function Tests: Recent Labs  Lab 02/15/18 2023  AST 26  ALT 33  ALKPHOS 52  BILITOT 0.3  PROT 6.2*  ALBUMIN 3.8   No results for input(s): LIPASE, AMYLASE in the last 168 hours. No results for input(s): AMMONIA in the last 168  hours. Coagulation Profile: No results for input(s): INR, PROTIME in the last 168 hours. CBC: Recent Labs  Lab 02/15/18 2023  WBC 7.0  NEUTROABS 4.4  HGB 14.2  HCT 41.4  MCV 91.8  PLT 160   Cardiac Enzymes: No results for input(s): CKTOTAL, CKMB, CKMBINDEX, TROPONINI in the last 168 hours. BNP: Invalid input(s): POCBNP CBG: No results for input(s): GLUCAP in the last 168 hours. HbA1C: No results for input(s): HGBA1C in the last 72 hours. Urine analysis:    Component Value Date/Time   COLORURINE YELLOW 08/22/2017 1656   APPEARANCEUR CLEAR 08/22/2017 1656   LABSPEC 1.017 08/22/2017 1656   PHURINE 6.0 08/22/2017 1656   GLUCOSEU NEGATIVE 08/22/2017 1656   HGBUR NEGATIVE 08/22/2017 [place] 08/22/2017 1656   KETONESUR NEGATIVE 08/22/2017 1656   PROTEINUR NEGATIVE 08/22/2017 1656   NITRITE NEGATIVE 08/22/2017 1656   LEUKOCYTESUR NEGATIVE 08/22/2017 1656   Sepsis Labs: @LABRCNTIP (procalcitonin:4,lacticidven:4) )No results found for this or any previous visit (from the past 240 hour(s)).   Scheduled Meds: . divalproex  1,000 mg Oral Daily  . FLUoxetine  20 mg Oral Daily  . levETIRAcetam  1,500 mg Oral BID   Continuous Infusions: . sodium chloride 100 mL/hr at  02/16/18 0500    Procedures/Studies: Dg Thoracic Spine 2 View  Result Date: 02/14/2018 CLINICAL DATA:  52 y/o M; mid to lower back pain. Patient had a seizure today and fell. EXAM: THORACIC SPINE 2 VIEWS; LUMBAR SPINE - 2-3 VIEW COMPARISON:  09/01/2017 thoracic spine radiographs. 01/24/2018 lumbar spine radiographs. FINDINGS: Thoracic spine: There is no evidence of thoracic spine fracture. Alignment is normal. No other significant bone abnormalities are identified. Right upper quadrant cholecystectomy clips. Lumbar spine: There is no evidence of thoracic spine fracture. Alignment is normal. Moderate L5-S1 loss of intervertebral disc space height. Lower lumbar facet arthrosis. No other significant  bone abnormalities are identified. IMPRESSION: 1.  No acute bony or articular abnormality identified. 2. Stable moderate L5-S1 spondylosis. Electronically Signed   By: Kristine Garbe M.D.   On: 02/14/2018 21:54   Dg Lumbar Spine 2-3 Views  Result Date: 02/14/2018 CLINICAL DATA:  52 y/o M; mid to lower back pain. Patient had a seizure today and fell. EXAM: THORACIC SPINE 2 VIEWS; LUMBAR SPINE - 2-3 VIEW COMPARISON:  09/01/2017 thoracic spine radiographs. 01/24/2018 lumbar spine radiographs. FINDINGS: Thoracic spine: There is no evidence of thoracic spine fracture. Alignment is normal. No other significant bone abnormalities are identified. Right upper quadrant cholecystectomy clips. Lumbar spine: There is no evidence of thoracic spine fracture. Alignment is normal. Moderate L5-S1 loss of intervertebral disc space height. Lower lumbar facet arthrosis. No other significant bone abnormalities are identified. IMPRESSION: 1.  No acute bony or articular abnormality identified. 2. Stable moderate L5-S1 spondylosis. Electronically Signed   By: Kristine Garbe M.D.   On: 02/14/2018 21:54   Dg Lumbar Spine Complete  Result Date: 01/24/2018 CLINICAL DATA:  Low back pain after fall last week. EXAM: LUMBAR SPINE - COMPLETE 4+ VIEW COMPARISON:  None. FINDINGS: Five lumbar type vertebral bodies. No acute fracture or subluxation. Vertebral body heights are preserved. Alignment is normal. Mild disc height loss and facet arthropathy at L5-S1. The sacroiliac joints are intact. IMPRESSION: 1.  No acute osseous abnormality. 2. Mild degenerative disc disease and facet arthropathy at L5-S1. Electronically Signed   By: Titus Dubin M.D.   On: 01/24/2018 10:21   Dg Shoulder Right  Result Date: 01/24/2018 CLINICAL DATA:  Multiple seizures, multiple falls EXAM: RIGHT SHOULDER - 2+ VIEW COMPARISON:  09/01/2017 FINDINGS: Osseous mineralization normal. AC joint alignment normal. Visualized RIGHT ribs intact,  though an old healed fracture of the posterior RIGHT sixth rib is noted. No acute fracture, dislocation, or bone destruction. IMPRESSION: Old healed RIGHT posterior sixth rib fracture. No acute RIGHT shoulder abnormalities. Electronically Signed   By: Lavonia Dana M.D.   On: 01/24/2018 14:00   Ct Head Wo Contrast  Result Date: 01/24/2018 CLINICAL DATA:  52 year old male with increasing frequency of seizures. Recent dizziness and weakness. Altered mental status. EXAM: CT HEAD WITHOUT CONTRAST TECHNIQUE: Contiguous axial images were obtained from the base of the skull through the vertex without intravenous contrast. COMPARISON:  Brain MRI and head CT 08/25/2017. FINDINGS: Brain: Cerebral volume is stable and normal. No midline shift, ventriculomegaly, mass effect, evidence of mass lesion, intracranial hemorrhage or evidence of cortically based acute infarction. Gray-white matter differentiation is within normal limits throughout the brain. Vascular: No suspicious intracranial vascular hyperdensity. Skull: Stable and negative. Sinuses/Orbits: Clear. Other: Visualized orbits and scalp soft tissues are within normal limits. IMPRESSION: Stable and normal noncontrast Head CT. Electronically Signed   By: Genevie Ann M.D.   On: 01/24/2018 10:32   Dg Finger Thumb  Left  Result Date: 01/24/2018 CLINICAL DATA:  Multiple seizures and falls in the last month, pain at base of thumb EXAM: LEFT THUMB 2+V COMPARISON:  None FINDINGS: Osseous mineralization normal. Mild degenerative changes at IP joint LEFT thumb with radial spur formation. No acute fracture, dislocation, or bone destruction. IMPRESSION: Minimal degenerative changes at IP joint LEFT thumb. No acute bony abnormalities. Electronically Signed   By: Lavonia Dana M.D.   On: 01/24/2018 14:03    Orson Eva, DO  [facility] Pager [phone]  If 7PM-7AM, please contact night-coverage [email or web address] Password [email or web address] 02/16/2018, 7:13 AM   LOS: 0 days

## 2018-02-16 NOTE — Procedures (Signed)
ELECTROENCEPHALOGRAM REPORT  Date of Study: 02/16/2018  Patient's Name: Charles Tyler MRN: [id number] Date of Birth: [birth year]  Referring Provider: Dr. Shanon Brow Tat  Clinical History: This is a 52 year old man with seizure-like episodes  Medications: levETIRAcetam (KEPPRA) tablet 1,500 mg  divalproex (DEPAKOTE ER) 24 hr tablet 1,000 mg  FLUoxetine (PROZAC) capsule 20 mg  HYDROcodone-acetaminophen (NORCO/VICODIN) 5-325 MG per tablet 1 tablet  ibuprofen (ADVIL,MOTRIN) tablet 800 mg  methocarbamol (ROBAXIN) tablet 1,000 mg  ondansetron (ZOFRAN) injection 4 mg   Technical Summary: A multichannel digital EEG recording measured by the international 10-20 system with electrodes applied with paste and impedances below 5000 ohms performed in our laboratory with EKG monitoring in an awake and drowsy patient.  Hyperventilation was not performed. Photic stimulation was performed.  The digital EEG was referentially recorded, reformatted, and digitally filtered in a variety of bipolar and referential montages for optimal display.    Description: The patient is awake and drowsy during the recording.  During maximal wakefulness, there is a symmetric, medium voltage 8.5 Hz posterior dominant rhythm that attenuates with eye opening.  The record is symmetric.  During drowsiness, there is an increase in theta slowing of the background. Deeper stages of sleep were not seen. Photic stimulation did not elicit any abnormalities.  There were no epileptiform discharges or electrographic seizures seen.    EKG lead was unremarkable.  Impression: This awake and drowsy EEG is normal.    Clinical Correlation: A normal EEG does not exclude a clinical diagnosis of epilepsy. Clinical correlation is advised.   Ellouise Newer, M.D.

## 2018-02-16 NOTE — Care Management Note (Signed)
Case Management Note  Patient Details  Name: Charles Tyler MRN: [id number] Date of Birth: [birth year]  Subjective/Objective: Adm with seizures. From home. Lost his insurance back in September/October.  Has not been able to afford medications. Provided [employer] voucher and [facility] [facility] packet. Financial counselor will meet with patient to discuss his recent SS/Disability application and assess for Medicaid eligibility. Patient would like to update POA paperwork. Christain [place] notified and will meet with patient.                Action/Plan: DC home with self care. Will utilize [employer] voucher at [employer].   Expected Discharge Date:    02/17/2018              Expected Discharge Plan:  Home/Self Care  In-House Referral:     Discharge planning Services  CM Consult, Medication Assistance, [place] [facility]  Post Acute Care Choice:  NA Choice offered to:  NA  DME Arranged:    DME Agency:     HH Arranged:    HH Agency:     Status of Service:  Completed, signed off  If discussed at [employer] of Stay Meetings, dates discussed:    Additional Comments:  [place], Chauncey Reading, RN 02/16/2018, 12:11 PM

## 2018-02-16 NOTE — Progress Notes (Signed)
EEG completed, results pending. 

## 2018-02-17 DIAGNOSIS — F331 Major depressive disorder, recurrent, moderate: Secondary | ICD-10-CM

## 2018-02-17 DIAGNOSIS — R569 Unspecified convulsions: Secondary | ICD-10-CM | POA: Diagnosis not present

## 2018-02-17 DIAGNOSIS — I2510 Atherosclerotic heart disease of native coronary artery without angina pectoris: Secondary | ICD-10-CM

## 2018-02-17 DIAGNOSIS — Z720 Tobacco use: Secondary | ICD-10-CM

## 2018-02-17 DIAGNOSIS — R55 Syncope and collapse: Secondary | ICD-10-CM

## 2018-02-17 DIAGNOSIS — F418 Other specified anxiety disorders: Secondary | ICD-10-CM

## 2018-02-17 LAB — PROLACTIN: Prolactin: 10.4 ng/mL (ref 4.0–15.2)

## 2018-02-17 LAB — URINE CULTURE: Culture: NO GROWTH

## 2018-02-17 MED ORDER — FLUOXETINE HCL 20 MG PO CAPS: 20.0000 mg | ORAL_CAPSULE | Freq: Every day | ORAL | 0 refills | Status: DC

## 2018-02-17 MED ORDER — LEVETIRACETAM 750 MG PO TABS: 1500.0000 mg | ORAL_TABLET | Freq: Two times a day (BID) | ORAL | 0 refills | Status: DC

## 2018-02-17 MED ORDER — DIVALPROEX SODIUM ER 500 MG PO TB24: 1000.0000 mg | ORAL_TABLET | Freq: Every day | ORAL | 0 refills | Status: DC

## 2018-02-17 NOTE — Discharge Summary (Signed)
Physician Discharge Summary  Charles Tyler JJO:[id number] DOB: [birth year] DOA: 02/15/2018  PCP: Rosita Fire, MD  Admit date: 02/15/2018 Discharge date: 02/17/2018  Admitted From: Home Disposition:  Home   Recommendations for Outpatient Follow-up:  1. Follow up with PCP in 1-2 weeks 2. Please obtain BMP/CBC in one week    Discharge Condition: Stable CODE STATUS: FULL Diet recommendation: Regular   Brief/Interim Summary: 52 year old male with a history of major depression, hyperlipidemia, coronary artery disease, and seizure-like episodes presenting with increased frequency of seizure-like episodes over the last 2 days.  Patient states that he was initially diagnosed with "seizures"back in November 2018, but felt like he has been having seizures for much longer than that because of recurrent history of syncopal episodes.  He has had a negative cardiac workup for his syncope in the past.  Nevertheless, the patient was in the emergency department on 2 separate occasions on 02/14/2018 secondary to seizure-like activity.  The patient had another spell in the morning of 02/15/2018 resulting in another emergency department visit.  The patient was initially admitted to the hospital from 08/25/17 through 08/26/17 for seizure-like activity.  He was discharged with Keppra 500 mg twice daily.  The patient was admitted again to the hospital from 09/10/17 through 09/13/17 for seizure-like activity.  During that admission, the patient had overnight EEG monitoring which was normal.  He did not have any spells at that time.  The patient was discharged home with Keppra 1000 mg twice daily.   He subsequently followed up with neurology, Dr. [name], on 10/25/2017.  At that time, Depakote 500 mg twice daily was added, and his Keppra was increased to 1500 mg twice daily.  However, the patient lost his medical insurance soon after that visit.  Subsequently, he has been off his antiepileptic medications for the last 2-3  months.  He is currently seeking to get onto disability and Medicaid.  When asked about his seizure activity, the patient is very nondescript and apathetic.  He states that he does not remember having the seizures, but his multiple seizures have been witnessed by his significant other.  His spells have been previously described as body stiffness, body tremors, and disorientation.  His significant other is also witnessed nighttime seizure-like activity with the patient's body shaking (R>L), body stiffness, and waking up confused.  There is no bowel or bladder incontinence nor does the patient bite his tongue.  In the emergency department, the patient was afebrile hemodynamically stable saturating 97% on room air.  BMP, LFTs, and CBC were essentially unremarkable.  The patient was admitted for observation and further workup.    Discharge Diagnoses:   Seizure-like activity -Repeat EEG--normal -Check CPK--122 -Urine drug screen--positive THC -CT brain as the patient had a recent fall from his seizure-like activity -No seizure-like activity since admission -Check TSH--2.497 -Restart Keppra and Depakote--pt received MATCH voucher to help with medications at time of discharge -prolactin--normal -no further seizure activity during the admission -outpatient referral made to epileptology, Dr. Delice Lesch  Facial contusion -CT brain--neg  Major depression/PTSD -Continue fluoxetine  History of polysubstance abuse -Patient is sustained remission -endorsed use of THC currently -UDS--positive for [facility]  Coronary artery disease -08/23/2017 heart catheterization--Dr. Mallie Mussel Smith--minimal CAD, no mention of stents -Continue aspirin 81 mg daily -Continue risk factor modification -No chest pain presently  Tobacco abuse -States that he quit smoking 4 months ago I have discussed tobacco cessation with the patient.  I have counseled the patient regarding the negative impacts of continued tobacco  use  including but not limited to lung cancer, COPD, and cardiovascular disease.  I have discussed alternatives to tobacco and modalities that may help facilitate tobacco cessation including but not limited to biofeedback, hypnosis, and medications.  Total time spent with tobacco counseling was 4 minutes.  Hypokalemia -repleted     Discharge Instructions  Discharge Instructions    Ambulatory referral to Neurology   Complete by:  As directed    Refer to [facility]--seizure vs pseudoseizure Please call [phone] for appointment     Allergies as of 02/17/2018      Reactions   Aspirin Nausea And Vomiting   325 mg    Other Hives   Walnuts      Medication List    STOP taking these medications   mirtazapine 30 MG tablet Commonly known as:  REMERON     TAKE these medications   divalproex 500 MG 24 hr tablet Commonly known as:  DEPAKOTE ER Take 2 tablets (1,000 mg total) by mouth daily.   FLUoxetine 20 MG capsule Commonly known as:  PROZAC Take 1 capsule (20 mg total) by mouth daily.   HYDROcodone-acetaminophen 5-325 MG tablet Commonly known as:  NORCO/VICODIN Take 1 tablet by mouth every 6 (six) hours as needed for moderate pain.   ibuprofen 800 MG tablet Commonly known as:  ADVIL,MOTRIN Take 1 tablet (800 mg total) by mouth every 8 (eight) hours as needed.   levETIRAcetam 750 MG tablet Commonly known as:  KEPPRA Take 2 tablets (1,500 mg total) by mouth 2 (two) times daily.   methocarbamol 500 MG tablet Commonly known as:  ROBAXIN Take 1 or 2 po Q 6hrs for muscle pain What changed:    how much to take  how to take this  when to take this  reasons to take this  additional instructions       Allergies  Allergen Reactions  . Aspirin Nausea And Vomiting    325 mg   . Other Hives    Walnuts    Consultations:  none   Procedures/Studies: Dg Thoracic Spine 2 View  Result Date: 02/14/2018 CLINICAL DATA:  52 y/o M; mid to lower back pain.  Patient had a seizure today and fell. EXAM: THORACIC SPINE 2 VIEWS; LUMBAR SPINE - 2-3 VIEW COMPARISON:  09/01/2017 thoracic spine radiographs. 01/24/2018 lumbar spine radiographs. FINDINGS: Thoracic spine: There is no evidence of thoracic spine fracture. Alignment is normal. No other significant bone abnormalities are identified. Right upper quadrant cholecystectomy clips. Lumbar spine: There is no evidence of thoracic spine fracture. Alignment is normal. Moderate L5-S1 loss of intervertebral disc space height. Lower lumbar facet arthrosis. No other significant bone abnormalities are identified. IMPRESSION: 1.  No acute bony or articular abnormality identified. 2. Stable moderate L5-S1 spondylosis. Electronically Signed   By: Kristine Garbe M.D.   On: 02/14/2018 21:54   Dg Lumbar Spine 2-3 Views  Result Date: 02/14/2018 CLINICAL DATA:  52 y/o M; mid to lower back pain. Patient had a seizure today and fell. EXAM: THORACIC SPINE 2 VIEWS; LUMBAR SPINE - 2-3 VIEW COMPARISON:  09/01/2017 thoracic spine radiographs. 01/24/2018 lumbar spine radiographs. FINDINGS: Thoracic spine: There is no evidence of thoracic spine fracture. Alignment is normal. No other significant bone abnormalities are identified. Right upper quadrant cholecystectomy clips. Lumbar spine: There is no evidence of thoracic spine fracture. Alignment is normal. Moderate L5-S1 loss of intervertebral disc space height. Lower lumbar facet arthrosis. No other significant bone abnormalities are identified. IMPRESSION: 1.  No  acute bony or articular abnormality identified. 2. Stable moderate L5-S1 spondylosis. Electronically Signed   By: Kristine Garbe M.D.   On: 02/14/2018 21:54   Dg Lumbar Spine Complete  Result Date: 01/24/2018 CLINICAL DATA:  Low back pain after fall last week. EXAM: LUMBAR SPINE - COMPLETE 4+ VIEW COMPARISON:  None. FINDINGS: Five lumbar type vertebral bodies. No acute fracture or subluxation. Vertebral body  heights are preserved. Alignment is normal. Mild disc height loss and facet arthropathy at L5-S1. The sacroiliac joints are intact. IMPRESSION: 1.  No acute osseous abnormality. 2. Mild degenerative disc disease and facet arthropathy at L5-S1. Electronically Signed   By: Titus Dubin M.D.   On: 01/24/2018 10:21   Dg Shoulder Right  Result Date: 01/24/2018 CLINICAL DATA:  Multiple seizures, multiple falls EXAM: RIGHT SHOULDER - 2+ VIEW COMPARISON:  09/01/2017 FINDINGS: Osseous mineralization normal. AC joint alignment normal. Visualized RIGHT ribs intact, though an old healed fracture of the posterior RIGHT sixth rib is noted. No acute fracture, dislocation, or bone destruction. IMPRESSION: Old healed RIGHT posterior sixth rib fracture. No acute RIGHT shoulder abnormalities. Electronically Signed   By: Lavonia Dana M.D.   On: 01/24/2018 14:00   Ct Head Wo Contrast  Result Date: 02/16/2018 CLINICAL DATA:  52 year old male with recent seizure and fall. Seizure activity beginning 6 months ago, or possibly earlier. EXAM: CT HEAD WITHOUT CONTRAST TECHNIQUE: Contiguous axial images were obtained from the base of the skull through the vertex without intravenous contrast. COMPARISON:  Head CT without contrast 01/24/2018. Brain MRI 08/15/2017, and earlier. FINDINGS: Brain: Stable and normal cerebral volume. No midline shift, ventriculomegaly, mass effect, evidence of mass lesion, intracranial hemorrhage or evidence of cortically based acute infarction. Gray-white matter differentiation is within normal limits throughout the brain. Vascular: No suspicious intracranial vascular hyperdensity. Mild Calcified atherosclerosis at the skull base. Skull: Negative. Sinuses/Orbits: Clear aside from mild bilateral frontoethmoidal recess mucosal thickening. Tympanic cavities and mastoids remain clear. Other: Stable and negative orbit and scalp soft tissues. IMPRESSION: Stable and normal noncontrast CT appearance of the brain.  Electronically Signed   By: Genevie Ann M.D.   On: 02/16/2018 09:10   Ct Head Wo Contrast  Result Date: 01/24/2018 CLINICAL DATA:  52 year old male with increasing frequency of seizures. Recent dizziness and weakness. Altered mental status. EXAM: CT HEAD WITHOUT CONTRAST TECHNIQUE: Contiguous axial images were obtained from the base of the skull through the vertex without intravenous contrast. COMPARISON:  Brain MRI and head CT 08/25/2017. FINDINGS: Brain: Cerebral volume is stable and normal. No midline shift, ventriculomegaly, mass effect, evidence of mass lesion, intracranial hemorrhage or evidence of cortically based acute infarction. Gray-white matter differentiation is within normal limits throughout the brain. Vascular: No suspicious intracranial vascular hyperdensity. Skull: Stable and negative. Sinuses/Orbits: Clear. Other: Visualized orbits and scalp soft tissues are within normal limits. IMPRESSION: Stable and normal noncontrast Head CT. Electronically Signed   By: Genevie Ann M.D.   On: 01/24/2018 10:32   Dg Finger Thumb Left  Result Date: 01/24/2018 CLINICAL DATA:  Multiple seizures and falls in the last month, pain at base of thumb EXAM: LEFT THUMB 2+V COMPARISON:  None FINDINGS: Osseous mineralization normal. Mild degenerative changes at IP joint LEFT thumb with radial spur formation. No acute fracture, dislocation, or bone destruction. IMPRESSION: Minimal degenerative changes at IP joint LEFT thumb. No acute bony abnormalities. Electronically Signed   By: Lavonia Dana M.D.   On: 01/24/2018 14:03        Discharge Exam:  Vitals:   02/16/18 2121 02/17/18 0559  BP: 115/72 120/69  Pulse: (!) 56 (!) 57  Resp: 16 16  Temp: 98.2 F (36.8 C) 98.1 F (36.7 C)  SpO2: 97% 98%   Vitals:   02/16/18 0623 02/16/18 1401 02/16/18 2121 02/17/18 0559  BP: 113/73 116/68 115/72 120/69  Pulse: (!) 57 (!) 53 (!) 56 (!) 57  Resp: 16 16 16 16   Temp: 98.1 F (36.7 C) 98.4 F (36.9 C) 98.2 F (36.8 C)  98.1 F (36.7 C)  TempSrc: Oral Oral Oral Oral  SpO2: 97% 96% 97% 98%  Weight:      Height:        General: Pt is alert, awake, not in acute distress Cardiovascular: RRR, S1/S2 +, no rubs, no gallops Respiratory: CTA bilaterally, no wheezing, no rhonchi Abdominal: Soft, NT, ND, bowel sounds + Extremities: no edema, no cyanosis   The results of significant diagnostics from this hospitalization (including imaging, microbiology, ancillary and laboratory) are listed below for reference.    Significant Diagnostic Studies: Dg Thoracic Spine 2 View  Result Date: 02/14/2018 CLINICAL DATA:  52 y/o M; mid to lower back pain. Patient had a seizure today and fell. EXAM: THORACIC SPINE 2 VIEWS; LUMBAR SPINE - 2-3 VIEW COMPARISON:  09/01/2017 thoracic spine radiographs. 01/24/2018 lumbar spine radiographs. FINDINGS: Thoracic spine: There is no evidence of thoracic spine fracture. Alignment is normal. No other significant bone abnormalities are identified. Right upper quadrant cholecystectomy clips. Lumbar spine: There is no evidence of thoracic spine fracture. Alignment is normal. Moderate L5-S1 loss of intervertebral disc space height. Lower lumbar facet arthrosis. No other significant bone abnormalities are identified. IMPRESSION: 1.  No acute bony or articular abnormality identified. 2. Stable moderate L5-S1 spondylosis. Electronically Signed   By: Kristine Garbe M.D.   On: 02/14/2018 21:54   Dg Lumbar Spine 2-3 Views  Result Date: 02/14/2018 CLINICAL DATA:  52 y/o M; mid to lower back pain. Patient had a seizure today and fell. EXAM: THORACIC SPINE 2 VIEWS; LUMBAR SPINE - 2-3 VIEW COMPARISON:  09/01/2017 thoracic spine radiographs. 01/24/2018 lumbar spine radiographs. FINDINGS: Thoracic spine: There is no evidence of thoracic spine fracture. Alignment is normal. No other significant bone abnormalities are identified. Right upper quadrant cholecystectomy clips. Lumbar spine: There is no  evidence of thoracic spine fracture. Alignment is normal. Moderate L5-S1 loss of intervertebral disc space height. Lower lumbar facet arthrosis. No other significant bone abnormalities are identified. IMPRESSION: 1.  No acute bony or articular abnormality identified. 2. Stable moderate L5-S1 spondylosis. Electronically Signed   By: Kristine Garbe M.D.   On: 02/14/2018 21:54   Dg Lumbar Spine Complete  Result Date: 01/24/2018 CLINICAL DATA:  Low back pain after fall last week. EXAM: LUMBAR SPINE - COMPLETE 4+ VIEW COMPARISON:  None. FINDINGS: Five lumbar type vertebral bodies. No acute fracture or subluxation. Vertebral body heights are preserved. Alignment is normal. Mild disc height loss and facet arthropathy at L5-S1. The sacroiliac joints are intact. IMPRESSION: 1.  No acute osseous abnormality. 2. Mild degenerative disc disease and facet arthropathy at L5-S1. Electronically Signed   By: Titus Dubin M.D.   On: 01/24/2018 10:21   Dg Shoulder Right  Result Date: 01/24/2018 CLINICAL DATA:  Multiple seizures, multiple falls EXAM: RIGHT SHOULDER - 2+ VIEW COMPARISON:  09/01/2017 FINDINGS: Osseous mineralization normal. AC joint alignment normal. Visualized RIGHT ribs intact, though an old healed fracture of the posterior RIGHT sixth rib is noted. No acute fracture, dislocation, or bone  destruction. IMPRESSION: Old healed RIGHT posterior sixth rib fracture. No acute RIGHT shoulder abnormalities. Electronically Signed   By: Lavonia Dana M.D.   On: 01/24/2018 14:00   Ct Head Wo Contrast  Result Date: 02/16/2018 CLINICAL DATA:  52 year old male with recent seizure and fall. Seizure activity beginning 6 months ago, or possibly earlier. EXAM: CT HEAD WITHOUT CONTRAST TECHNIQUE: Contiguous axial images were obtained from the base of the skull through the vertex without intravenous contrast. COMPARISON:  Head CT without contrast 01/24/2018. Brain MRI 08/15/2017, and earlier. FINDINGS: Brain: Stable  and normal cerebral volume. No midline shift, ventriculomegaly, mass effect, evidence of mass lesion, intracranial hemorrhage or evidence of cortically based acute infarction. Gray-white matter differentiation is within normal limits throughout the brain. Vascular: No suspicious intracranial vascular hyperdensity. Mild Calcified atherosclerosis at the skull base. Skull: Negative. Sinuses/Orbits: Clear aside from mild bilateral frontoethmoidal recess mucosal thickening. Tympanic cavities and mastoids remain clear. Other: Stable and negative orbit and scalp soft tissues. IMPRESSION: Stable and normal noncontrast CT appearance of the brain. Electronically Signed   By: Genevie Ann M.D.   On: 02/16/2018 09:10   Ct Head Wo Contrast  Result Date: 01/24/2018 CLINICAL DATA:  52 year old male with increasing frequency of seizures. Recent dizziness and weakness. Altered mental status. EXAM: CT HEAD WITHOUT CONTRAST TECHNIQUE: Contiguous axial images were obtained from the base of the skull through the vertex without intravenous contrast. COMPARISON:  Brain MRI and head CT 08/25/2017. FINDINGS: Brain: Cerebral volume is stable and normal. No midline shift, ventriculomegaly, mass effect, evidence of mass lesion, intracranial hemorrhage or evidence of cortically based acute infarction. Gray-white matter differentiation is within normal limits throughout the brain. Vascular: No suspicious intracranial vascular hyperdensity. Skull: Stable and negative. Sinuses/Orbits: Clear. Other: Visualized orbits and scalp soft tissues are within normal limits. IMPRESSION: Stable and normal noncontrast Head CT. Electronically Signed   By: Genevie Ann M.D.   On: 01/24/2018 10:32   Dg Finger Thumb Left  Result Date: 01/24/2018 CLINICAL DATA:  Multiple seizures and falls in the last month, pain at base of thumb EXAM: LEFT THUMB 2+V COMPARISON:  None FINDINGS: Osseous mineralization normal. Mild degenerative changes at IP joint LEFT thumb with  radial spur formation. No acute fracture, dislocation, or bone destruction. IMPRESSION: Minimal degenerative changes at IP joint LEFT thumb. No acute bony abnormalities. Electronically Signed   By: Lavonia Dana M.D.   On: 01/24/2018 14:03     Microbiology: No results found for this or any previous visit (from the past 240 hour(s)).   Labs: Basic Metabolic Panel: Recent Labs  Lab 02/15/18 2023 02/16/18 0618  NA 139 140  K 3.4* 3.8  CL 109 110  CO2 23 23  GLUCOSE 117* 100*  BUN 8 8  CREATININE 0.91 0.75  CALCIUM 8.4* 8.4*  MG 2.1  --   PHOS 2.7  --    Liver Function Tests: Recent Labs  Lab 02/15/18 2023  AST 26  ALT 33  ALKPHOS 52  BILITOT 0.3  PROT 6.2*  ALBUMIN 3.8   No results for input(s): LIPASE, AMYLASE in the last 168 hours. No results for input(s): AMMONIA in the last 168 hours. CBC: Recent Labs  Lab 02/15/18 2023  WBC 7.0  NEUTROABS 4.4  HGB 14.2  HCT 41.4  MCV 91.8  PLT 160   Cardiac Enzymes: Recent Labs  Lab 02/16/18 0618  CKTOTAL 122   BNP: Invalid input(s): POCBNP CBG: Recent Labs  Lab 02/16/18 0748  GLUCAP 97  Time coordinating discharge:  36 minutes  Signed:  Orson Eva, DO [facility] Pager: [phone] 02/17/2018, 10:14 AM

## 2018-02-17 NOTE — Progress Notes (Signed)
Removed IV-clean, dry, intact. Reviewed d/c paperwork with pt and wife. Reviewed medication changes and where to pick up prescriptions. Answered all questions. Stable pt walked out of the hospital.

## 2018-02-17 NOTE — Plan of Care (Signed)
progressing 

## 2018-02-19 ENCOUNTER — Encounter: Payer: Self-pay | Admitting: Neurology

## 2018-02-26 ENCOUNTER — Ambulatory Visit (HOSPITAL_COMMUNITY): Payer: Self-pay | Admitting: Psychiatry

## 2018-03-01 ENCOUNTER — Emergency Department (HOSPITAL_COMMUNITY)
Admission: EM | Admit: 2018-03-01 | Discharge: 2018-03-01 | Disposition: A | Discharge status: Home / Self Care | Payer: Medicaid Other | Attending: Emergency Medicine | Admitting: Emergency Medicine

## 2018-03-01 ENCOUNTER — Encounter (HOSPITAL_COMMUNITY): Payer: Self-pay

## 2018-03-01 ENCOUNTER — Other Ambulatory Visit: Payer: Self-pay

## 2018-03-01 DIAGNOSIS — F17210 Nicotine dependence, cigarettes, uncomplicated: Secondary | ICD-10-CM | POA: Diagnosis not present

## 2018-03-01 DIAGNOSIS — R569 Unspecified convulsions: Secondary | ICD-10-CM

## 2018-03-01 DIAGNOSIS — I2510 Atherosclerotic heart disease of native coronary artery without angina pectoris: Secondary | ICD-10-CM | POA: Insufficient documentation

## 2018-03-01 DIAGNOSIS — Z79899 Other long term (current) drug therapy: Secondary | ICD-10-CM | POA: Insufficient documentation

## 2018-03-01 LAB — CBC WITH DIFFERENTIAL/PLATELET
BASOS ABS: 0 10*3/uL (ref 0.0–0.1)
BASOS PCT: 0 %
EOS ABS: 0.7 10*3/uL (ref 0.0–0.7)
EOS PCT: 10 %
HCT: 45.6 % (ref 39.0–52.0)
Hemoglobin: 15.4 g/dL (ref 13.0–17.0)
Lymphocytes Relative: 24 %
Lymphs Abs: 1.7 10*3/uL (ref 0.7–4.0)
MCH: 31.6 pg (ref 26.0–34.0)
MCHC: 33.8 g/dL (ref 30.0–36.0)
MCV: 93.6 fL (ref 78.0–100.0)
MONO ABS: 0.5 10*3/uL (ref 0.1–1.0)
Monocytes Relative: 7 %
NEUTROS ABS: 4.1 10*3/uL (ref 1.7–7.7)
Neutrophils Relative %: 59 %
PLATELETS: 156 10*3/uL (ref 150–400)
RBC: 4.87 MIL/uL (ref 4.22–5.81)
RDW: 13.3 % (ref 11.5–15.5)
WBC: 6.9 10*3/uL (ref 4.0–10.5)

## 2018-03-01 LAB — COMPREHENSIVE METABOLIC PANEL
ALBUMIN: 3.6 g/dL (ref 3.5–5.0)
ALT: 22 U/L (ref 17–63)
AST: 17 U/L (ref 15–41)
Alkaline Phosphatase: 46 U/L (ref 38–126)
Anion gap: 8 (ref 5–15)
BUN: 12 mg/dL (ref 6–20)
CHLORIDE: 105 mmol/L (ref 101–111)
CO2: 26 mmol/L (ref 22–32)
Calcium: 8.3 mg/dL — ABNORMAL LOW (ref 8.9–10.3)
Creatinine, Ser: 0.9 mg/dL (ref 0.61–1.24)
GFR calc Af Amer: >60 mL/min (ref >60)
GFR calc non Af Amer: >60 mL/min (ref >60)
GLUCOSE: 86 mg/dL (ref 65–99)
POTASSIUM: 4 mmol/L (ref 3.5–5.1)
SODIUM: 139 mmol/L (ref 135–145)
Total Bilirubin: 0.5 mg/dL (ref 0.3–1.2)
Total Protein: 5.7 g/dL — ABNORMAL LOW (ref 6.5–8.1)

## 2018-03-01 LAB — VALPROIC ACID LEVEL: Valproic Acid Lvl: 47 ug/mL — ABNORMAL LOW (ref 50.0–100.0)

## 2018-03-01 MED ORDER — LEVETIRACETAM 750 MG PO TABS: 750.0000 mg | ORAL_TABLET | Freq: Two times a day (BID) | ORAL | 0 refills | Status: DC

## 2018-03-01 MED ORDER — SODIUM CHLORIDE 0.9 % IV BOLUS
1000.0000 mL | Freq: Once | INTRAVENOUS | Status: AC
  Administered 2018-03-01: 1000 mL via INTRAVENOUS

## 2018-03-01 MED ORDER — KETOROLAC TROMETHAMINE 30 MG/ML IJ SOLN
30.0000 mg | Freq: Once | INTRAMUSCULAR | Status: AC
  Administered 2018-03-01: 30 mg via INTRAVENOUS
  Filled 2018-03-01: qty 1

## 2018-03-01 MED ORDER — ONDANSETRON HCL 4 MG/2ML IJ SOLN
4.0000 mg | Freq: Once | INTRAMUSCULAR | Status: AC
  Administered 2018-03-01: 4 mg via INTRAVENOUS
  Filled 2018-03-01: qty 2

## 2018-03-01 MED ORDER — DIVALPROEX SODIUM ER 500 MG PO TB24: 1000.0000 mg | ORAL_TABLET | Freq: Every evening | ORAL | 0 refills | Status: DC

## 2018-03-01 NOTE — ED Triage Notes (Signed)
EMS reports they were called out for seizures.  EMS says when they arrived pt was laying on bed face down.  Reports pt's significant other says pt's r leg shakes then goes into full body shaking.  Reports had 6 seizures while she was on the phone with ems.  Pt takes depakote and keppra and denies missing any doses.  Pt oriented but speech slurred a little when they arrived.  Pt c/o mid to lower back pain.

## 2018-03-01 NOTE — ED Provider Notes (Signed)
[facility] EMERGENCY DEPARTMENT Provider Note   CSN: [id number] Arrival date & time: 03/01/18  1609     History   Chief Complaint Chief Complaint  Patient presents with  . Seizures    HPI Charles Tyler is a 52 y.o. male.  Pt with a known seizure disorder presents with a seizure today that was recognized by his wife.  He has a long-standing seizure disorder and has been noncompliant with his medication until recently.  He has been taking Depakote and Keppra.  No prodromal illnesses.  Patient had slurred speech when EMS arrived on the scene.  He was initially sleepy in the emergency department.  Past medical history includes seizure, depression, PTSD, CAD.  He is not drinking alcohol.     Past Medical History:  Diagnosis Date  . Coronary artery disease 08/2017   Stents placed in [place] in 2016. Nonobstructive lesions on 10/18 angiogram.  . Depression with anxiety   . History of cardiac catheterization    Done in [place] - details not clear  . Hyperlipidemia   . Migraine   . Seizures [facility])     Patient Active Problem List   Diagnosis Date Noted  . Coronary artery disease involving native coronary artery of native heart without angina pectoris 02/16/2018  . Tobacco abuse 02/16/2018  . Seizure-like activity ([place]) 02/15/2018  . Depression with anxiety 02/15/2018  . PTSD (post-traumatic stress disorder) 09/14/2017  . MDD (major depressive disorder), recurrent episode, moderate ([place]) 09/14/2017  . Seizure ([place]) 09/10/2017  . Weight loss 09/10/2017  . New onset seizure ([place]) 08/25/2017  . Seizures ([place]) 08/25/2017  . Chest pain 08/22/2017  . History of coronary artery stent placement 08/22/2017  . Hyperlipidemia 08/22/2017  . Back pain 08/22/2017  . Syncope 08/22/2017  . Coronary artery disease 08/07/2017    Past Surgical History:  Procedure Laterality Date  . CHOLECYSTECTOMY    . LEFT HEART CATH AND CORONARY ANGIOGRAPHY N/A 08/23/2017   Procedure: LEFT  HEART CATH AND CORONARY ANGIOGRAPHY;  Surgeon: Belva Crome, MD;  Location: [place] CV LAB;  Service: Cardiovascular;  Laterality: N/A;        Home Medications    Prior to Admission medications   Medication Sig Start Date End Date Taking? Authorizing Provider  diphenhydrAMINE (BENADRYL) 25 mg capsule Take 25 mg by mouth every 6 (six) hours as needed for itching.   Yes [provider]  FLUoxetine (PROZAC) 20 MG capsule Take 1 capsule (20 mg total) by mouth daily. 02/17/18  Yes Tat, Shanon Brow, MD  HYDROcodone-acetaminophen (NORCO/VICODIN) 5-325 MG tablet Take 1 tablet by mouth every 6 (six) hours as needed for moderate pain. 01/24/18  Yes Milton Ferguson, MD  divalproex (DEPAKOTE ER) 500 MG 24 hr tablet Take 2 tablets (1,000 mg total) by mouth Nightly. 03/01/18   Nat Christen, MD  ibuprofen (ADVIL,MOTRIN) 800 MG tablet Take 1 tablet (800 mg total) by mouth every 8 (eight) hours as needed. Patient not taking: Reported on 03/01/2018 02/14/18   Margette Fast, MD  levETIRAcetam (KEPPRA) 750 MG tablet Take 1 tablet (750 mg total) by mouth 2 (two) times daily. 2 tablets 2 times daily. 03/01/18   Nat Christen, MD  methocarbamol (ROBAXIN) 500 MG tablet Take 1 or 2 po Q 6hrs for muscle pain Patient taking differently: Take 500-1,000 mg by mouth every 6 (six) hours as needed for muscle spasms.  02/14/18   Carmin Muskrat, MD    Family History Family History  Problem Relation Age of Onset  .  CAD Father        Premature CAD status post CABG  . Heart attack Father   . COPD Father   . CAD Paternal Grandfather        Premature CAD status post CABG  . Alcohol abuse Maternal Grandfather   . Heart disease Maternal Grandfather   . Alcohol abuse Maternal Grandmother   . COPD Mother     Social History Social History   Tobacco Use  . Smoking status: Current Some Day Smoker    Packs/day: 1.00    Types: Cigarettes    Last attempt to quit: 01/05/2018    Years since quitting: 0.1  . Smokeless  tobacco: Never Used  Substance Use Topics  . Alcohol use: Yes    Comment: rarely, 09-14-2017 per pt occas  . Drug use: No    Comment: 09-14-2017 per pt stopped 30 years ago - Cocaine, Marijuana and speed      Allergies   Aspirin and Other   Review of Systems Review of Systems  All other systems reviewed and are negative.    Physical Exam Updated Vital Signs BP (!) 141/83   Pulse 71   Temp 98.1 F (36.7 C) (Oral)   Resp 19   Ht 5\' 7"  (1.702 m)   Wt 79.4 kg (175 lb)   SpO2 97%   BMI 27.41 kg/m   Physical Exam  Constitutional: He is oriented to person, place, and time.  Sleepy, arousable  HENT:  Head: Normocephalic and atraumatic.  Eyes: Conjunctivae are normal.  Neck: Neck supple.  Cardiovascular: Normal rate and regular rhythm.  Pulmonary/Chest: Effort normal and breath sounds normal.  Abdominal: Soft. Bowel sounds are normal.  Musculoskeletal: Normal range of motion.  Neurological: He is alert and oriented to person, place, and time.  Skin: Skin is warm and dry.  Psychiatric: He has a normal mood and affect. His behavior is normal.  Nursing note and vitals reviewed.    ED Treatments / Results  Labs (all labs ordered are listed, but only abnormal results are displayed) Labs Reviewed  COMPREHENSIVE METABOLIC PANEL - Abnormal; Notable for the following components:      Result Value   Calcium 8.3 (*)    Total Protein 5.7 (*)    All other components within normal limits  VALPROIC ACID LEVEL - Abnormal; Notable for the following components:   Valproic Acid Lvl 47 (*)    All other components within normal limits  CBC WITH DIFFERENTIAL/PLATELET    EKG EKG Interpretation  Date/Time:  Thursday March 01 2018 16:19:13 EDT Ventricular Rate:  72 PR Interval:    QRS Duration: 100 QT Interval:  369 QTC Calculation: 404 R Axis:   -10 Text Interpretation:  Sinus rhythm ST elev, probable normal early repol pattern Confirmed by Nat Christen [phone]) on 03/01/2018  7:01:46 PM   Radiology No results found.  Procedures Procedures (including critical care time)  Medications Ordered in ED Medications  sodium chloride 0.9 % bolus 1,000 mL (0 mLs Intravenous Stopped 03/01/18 1939)  ketorolac (TORADOL) 30 MG/ML injection 30 mg (30 mg Intravenous Given 03/01/18 1743)  ondansetron (ZOFRAN) injection 4 mg (4 mg Intravenous Given 03/01/18 1743)     Initial Impression / Assessment and Plan / ED Course  I have reviewed the triage vital signs and the nursing notes.  Pertinent labs & imaging results that were available during my care of the patient were reviewed by me and considered in my medical decision making (see chart for  details).     Patient with a long-standing seizure disorder presents with seizure today.  No seizures noted in the emergency department after 2 to 3 hours of observation.  Patient thinks he is been doing better since he has been more compliant with his medication.  We will refill his Depakote and Keppra for 1 month.  Follow-up with neurologist  Final Clinical Impressions(s) / ED Diagnoses   Final diagnoses:  Seizure [facility])    ED Discharge Orders        Ordered    levETIRAcetam (KEPPRA) 750 MG tablet  2 times daily     03/01/18 2014    divalproex (DEPAKOTE ER) 500 MG 24 hr tablet  (Dosepack) Nightly - one time     03/01/18 2014       Nat Christen, MD 03/01/18 2018

## 2018-03-01 NOTE — Discharge Instructions (Addendum)
Refills for your medication for 1 month.  No driving until cleared by a neurologist.  Return if worse.

## 2018-03-26 ENCOUNTER — Ambulatory Visit (HOSPITAL_COMMUNITY): Payer: Self-pay | Admitting: Licensed Clinical Social Worker

## 2018-04-05 ENCOUNTER — Ambulatory Visit (INDEPENDENT_AMBULATORY_CARE_PROVIDER_SITE_OTHER): Payer: Medicaid Other | Admitting: Licensed Clinical Social Worker

## 2018-04-05 DIAGNOSIS — F331 Major depressive disorder, recurrent, moderate: Secondary | ICD-10-CM | POA: Diagnosis not present

## 2018-04-06 ENCOUNTER — Encounter (HOSPITAL_COMMUNITY): Payer: Self-pay | Admitting: Licensed Clinical Social Worker

## 2018-04-06 NOTE — Progress Notes (Signed)
   THERAPIST PROGRESS NOTE  Session Time: 3:00 pm-3:45 pm  Participation Level: Active  Behavioral Response: CasualAlertAnxious and Depressed  Type of Therapy: Individual Therapy  Treatment Goals addressed: Coping  Interventions: CBT and Solution Focused  Summary: Charles Tyler is a 52 y.o. male who presents oriented x5 (person, place, situation, time and object), alert, anxious, casually dressed, appropriately groomed, average height, average weight, and cooperative to address mood and anxiety. Patient has a history of medical history including seizures. Patient has no history of mental health treatment. Patient denies symptoms of mania. Patient denies suicidal and homicidal ideations. Patient denies psychosis including auditory and visual hallucinations. Patient denies current substance abuse. Patient is at low risk for lethality.   Physical: Patient is feeling tired. He has an upcoming referral for an assessment with hematologist to get his blood assessed. His family history contains cancer and heart disease.  Spiritual/Values: No issue identified.  Relationships: Patient reports that his relationship has improved with his girlfriend. She is pregnant and they are trying to spend time together over the summer.   Emotional/Mental/Behavior: Patient is anxious about his health concerns. He has an upcoming hearing for his disability. He hopes that he can get approved.   Patient engaged in session. He responded well to interventions. Patient continues to met criteria for Major depressive disorder, recurrent episode, moderate with anxious distress. Patient will continue in outpatient therapy due to being the least restrictive service to meet his needs at this time. Patient made minimal progress on his goals.  Suicidal/Homicidal: Negativewithout intent/plan  Therapist Response: Therapist reviewed patient's recent thoughts and behaviors. Therapist utilized CBT to address mood. Therapist  processed patient's feelings to identify triggers for mood. Therapist explored patient's health and how it impacts his mood.  Plan: Return again in 3-4 weeks.  Diagnosis: Axis I: Major depressive disorder, recurrent episode, moderate with anxious distress    Axis II: No diagnosis    Glori Bickers, LCSW 04/06/2018
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
## 2018-04-16 ENCOUNTER — Ambulatory Visit (INDEPENDENT_AMBULATORY_CARE_PROVIDER_SITE_OTHER): Payer: Medicaid Other | Admitting: Psychiatry

## 2018-04-16 ENCOUNTER — Encounter (HOSPITAL_COMMUNITY): Payer: Self-pay | Admitting: Psychiatry

## 2018-04-16 VITALS — BP 115/76 | HR 60 | Ht 67.0 in | Wt 175.0 lb

## 2018-04-16 DIAGNOSIS — E785 Hyperlipidemia, unspecified: Secondary | ICD-10-CM | POA: Diagnosis not present

## 2018-04-16 DIAGNOSIS — F410 Panic disorder [episodic paroxysmal anxiety] without agoraphobia: Secondary | ICD-10-CM

## 2018-04-16 DIAGNOSIS — Z91410 Personal history of adult physical and sexual abuse: Secondary | ICD-10-CM

## 2018-04-16 DIAGNOSIS — I2510 Atherosclerotic heart disease of native coronary artery without angina pectoris: Secondary | ICD-10-CM | POA: Diagnosis not present

## 2018-04-16 DIAGNOSIS — Z560 Unemployment, unspecified: Secondary | ICD-10-CM | POA: Diagnosis not present

## 2018-04-16 DIAGNOSIS — Z91120 Patient's intentional underdosing of medication regimen due to financial hardship: Secondary | ICD-10-CM

## 2018-04-16 DIAGNOSIS — F419 Anxiety disorder, unspecified: Secondary | ICD-10-CM

## 2018-04-16 DIAGNOSIS — F17210 Nicotine dependence, cigarettes, uncomplicated: Secondary | ICD-10-CM | POA: Diagnosis not present

## 2018-04-16 DIAGNOSIS — Z955 Presence of coronary angioplasty implant and graft: Secondary | ICD-10-CM | POA: Diagnosis not present

## 2018-04-16 DIAGNOSIS — F1099 Alcohol use, unspecified with unspecified alcohol-induced disorder: Secondary | ICD-10-CM

## 2018-04-16 DIAGNOSIS — Z638 Other specified problems related to primary support group: Secondary | ICD-10-CM

## 2018-04-16 DIAGNOSIS — F4310 Post-traumatic stress disorder, unspecified: Secondary | ICD-10-CM

## 2018-04-16 DIAGNOSIS — Z811 Family history of alcohol abuse and dependence: Secondary | ICD-10-CM

## 2018-04-16 DIAGNOSIS — G4700 Insomnia, unspecified: Secondary | ICD-10-CM | POA: Diagnosis not present

## 2018-04-16 DIAGNOSIS — F331 Major depressive disorder, recurrent, moderate: Secondary | ICD-10-CM | POA: Diagnosis not present

## 2018-04-16 DIAGNOSIS — R450 Nervousness: Secondary | ICD-10-CM | POA: Diagnosis not present

## 2018-04-16 DIAGNOSIS — Z91411 Personal history of adult psychological abuse: Secondary | ICD-10-CM

## 2018-04-16 MED ORDER — MIRTAZAPINE 30 MG PO TABS: 30.0000 mg | ORAL_TABLET | Freq: Every day | ORAL | 0 refills | Status: DC

## 2018-04-16 MED ORDER — FLUOXETINE HCL 20 MG PO CAPS: 20.0000 mg | ORAL_CAPSULE | Freq: Every day | ORAL | 0 refills | Status: DC

## 2018-04-16 NOTE — Patient Instructions (Signed)
1. Fluoxetine 20 mg daily  2. Continue mirtazapine 30 mg at night  3. Return to clinic in three months for 15 mins

## 2018-04-19 ENCOUNTER — Encounter (HOSPITAL_COMMUNITY): Payer: Self-pay | Admitting: Hematology

## 2018-04-19 ENCOUNTER — Other Ambulatory Visit: Payer: Self-pay

## 2018-04-19 ENCOUNTER — Inpatient Hospital Stay (HOSPITAL_COMMUNITY): Payer: Medicaid Other

## 2018-04-19 ENCOUNTER — Inpatient Hospital Stay (HOSPITAL_COMMUNITY): Payer: Medicaid Other | Attending: Hematology | Admitting: Hematology

## 2018-04-19 VITALS — BP 119/68 | HR 64 | Resp 16 | Wt 176.4 lb

## 2018-04-19 DIAGNOSIS — D751 Secondary polycythemia: Secondary | ICD-10-CM

## 2018-04-19 DIAGNOSIS — L298 Other pruritus: Secondary | ICD-10-CM | POA: Diagnosis not present

## 2018-04-19 DIAGNOSIS — Z87891 Personal history of nicotine dependence: Secondary | ICD-10-CM

## 2018-04-19 DIAGNOSIS — G40909 Epilepsy, unspecified, not intractable, without status epilepticus: Secondary | ICD-10-CM | POA: Insufficient documentation

## 2018-04-19 LAB — CBC WITH DIFFERENTIAL/PLATELET
BASOS PCT: 0 %
Basophils Absolute: 0 10*3/uL (ref 0.0–0.1)
EOS ABS: 1.1 10*3/uL — AB (ref 0.0–0.7)
Eosinophils Relative: 11 %
HEMATOCRIT: 52.5 % — AB (ref 39.0–52.0)
HEMOGLOBIN: 18.1 g/dL — AB (ref 13.0–17.0)
LYMPHS ABS: 1.8 10*3/uL (ref 0.7–4.0)
Lymphocytes Relative: 19 %
MCH: 32.5 pg (ref 26.0–34.0)
MCHC: 34.5 g/dL (ref 30.0–36.0)
MCV: 94.3 fL (ref 78.0–100.0)
MONO ABS: 0.7 10*3/uL (ref 0.1–1.0)
MONOS PCT: 7 %
NEUTROS PCT: 63 %
Neutro Abs: 6 10*3/uL (ref 1.7–7.7)
Platelets: 143 10*3/uL — ABNORMAL LOW (ref 150–400)
RBC: 5.57 MIL/uL (ref 4.22–5.81)
RDW: 12.9 % (ref 11.5–15.5)
WBC: 9.7 10*3/uL (ref 4.0–10.5)

## 2018-04-19 LAB — LACTATE DEHYDROGENASE: LDH: 112 U/L (ref 98–192)

## 2018-04-19 LAB — URINALYSIS, ROUTINE W REFLEX MICROSCOPIC
BILIRUBIN URINE: NEGATIVE
GLUCOSE, UA: NEGATIVE mg/dL
HGB URINE DIPSTICK: NEGATIVE
Ketones, ur: NEGATIVE mg/dL
Leukocytes, UA: NEGATIVE
Nitrite: NEGATIVE
Protein, ur: NEGATIVE mg/dL
Specific Gravity, Urine: 1.019 (ref 1.005–1.030)
pH: 5 (ref 5.0–8.0)

## 2018-04-19 NOTE — Assessment & Plan Note (Signed)
1.  Erythrocytosis: -CBC on 03/19/2018 done at Dr. Josephine Cables office showed hemoglobin of 19.2, hematocrit of 54.3 with elevated red cell count of 5.88.  White count and platelet count were within normal limits.  Differential showed 10.7% eosinophils with increased absolute eosinophil count. - On review of epic system for previous labs, his hemoglobin was found to be elevated in October 2018 in March 2019.  It has been normal other times. -Patient was diagnosed with seizure disorder and was started on Keppra and Depakote. -I have reviewed a chest x-ray from October 2018 which was showing no evidence of cardiopulmonary disease. -He does not have any fevers, night sweats or weight loss.  Does not have any history of thrombosis.  No symptoms of erythromelalgias.  He has itching on his face and neck which lasts about 45 minutes after taking hot showers for the last 2 months. - He was X smoker who smoked 2 pack/day for 20 years and quit 11 years ago.  He started back smoking 2 months ago as his girlfriend also smokes but both of them quit 3 days ago. - We will order CBC, carbon monoxide level and an LDH level.  We will also check a erythropoietin level.  We will order urinalysis to see if there is any microscopic hematuria.  We will also check a Jak 2 V617 mutation testing.  We discussed in detail various causes of erythrocytosis including primary polycythemia versus secondary causes.  We will see him back in 2 weeks to discuss the results.

## 2018-04-19 NOTE — Progress Notes (Signed)
CONSULT NOTE  Patient Care Team: Rosita Fire, MD as PCP - General (Internal Medicine)  CHIEF COMPLAINTS/PURPOSE OF CONSULTATION:  Erythrocytosis.  HISTORY OF PRESENTING ILLNESS:  Charles Tyler 52 y.o. male is seen in consultation today for further work-up and management of erythrocytosis.  A CBC done at Dr. Josephine Cables office on 03/19/2018 shows elevated hemoglobin of 19.2, hematocrit of 54.3 and elevated red blood cell count of 5.88.  Platelet count and white count was within normal limits.  Differential showed elevated absolute eosinophil count with eosinophil percentage of 10.7.  He denies any fevers, night sweats or weight loss in the last 6 months.  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  He was admitted couple of times to the hospital for seizures.  He is currently on Keppra and Depakote.  Denies any history of thrombosis.  He has migraine headaches since he was a kid.  He has on and off headaches from it.  Denies any signs or symptoms of erythromelalgia.  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  Denies any exposure to carbon monoxide.  Reportedly smoked for 20 years, 2 packs/day and quit 11 years ago.  Started back smoking about 2 months ago and quit again 3 days ago.  Denies any use of androgens or anabolic steroids.   MEDICAL HISTORY:  Past Medical History:  Diagnosis Date  . Coronary artery disease 08/2017   Stents placed in [place] in 2016. Nonobstructive lesions on 10/18 angiogram.  . Depression with anxiety   . History of cardiac catheterization    Done in [place] - details not clear  . Hyperlipidemia   . Migraine   . Seizures ([place])     SURGICAL HISTORY: Past Surgical History:  Procedure Laterality Date  . CHOLECYSTECTOMY    . LEFT HEART CATH AND CORONARY ANGIOGRAPHY N/A 08/23/2017   Procedure: LEFT HEART CATH AND CORONARY ANGIOGRAPHY;  Surgeon: Belva Crome, MD;  Location: [facility];  Service: Cardiovascular;  Laterality: N/A;    SOCIAL HISTORY: Social History   Socioeconomic History  . Marital status: Significant Other    Spouse name: Not on file  . Number of children: 4  . Years of education: 2 years college  . Highest education level: Not on file  Occupational History  . Occupation: Unemployed  Social Needs  . Financial resource strain: Not on file  . Food insecurity:    Worry: Not on file    Inability: Not on file  . Transportation needs:    Medical: Not on file    Non-medical: Not on file  Tobacco Use  . Smoking status: Current Some Day Smoker    Packs/day: 1.00    Types: Cigarettes    Last attempt to quit: 01/05/2018    Years since quitting: 0.2  . Smokeless tobacco: Never Used  . Tobacco comment: off and on   Substance and Sexual Activity  . Alcohol use: Not Currently    Comment: rarely, 09-14-2017 per pt occas  . Drug use: No    Comment: 09-14-2017 per pt stopped 30 years ago - Cocaine, Marijuana and speed   . Sexual activity: Yes    Birth control/protection: None  Lifestyle  . Physical activity:    Days per week: Not on file    Minutes per session: Not on  file  . Stress: Not on file  Relationships  . Social connections:    Talks on phone: Not on file    Gets together: Not on file    Attends religious service: Not on file    Active member of club or organization: Not on file    Attends meetings of clubs or organizations: Not on file    Relationship status: Not on file  . Intimate partner violence:    Fear of current or ex partner: Not on file    Emotionally abused: Not on file    Physically abused: Not on file    Forced sexual activity: Not on file  Other Topics Concern  . Not on file  Social History Narrative   Lives at home with his fiancee.   Right-handed.   2 cups caffeine per day.    FAMILY HISTORY: Family  History  Problem Relation Age of Onset  . CAD Father        Premature CAD status post CABG  . Heart attack Father   . COPD Father   . Heart disease Father   . CAD Paternal Grandfather        Premature CAD status post CABG  . Heart disease Paternal Grandfather   . Alcohol abuse Maternal Grandfather   . Heart disease Maternal Grandfather   . Alcohol abuse Maternal Grandmother   . COPD Mother   . Hyperlipidemia Sister   . Asthma Sister   . Asthma Brother   . Allergies Sister   . Asthma Sister   . Multiple sclerosis Daughter   . Seizures Daughter     ALLERGIES:  is allergic to aspirin and other.  MEDICATIONS:  Current Outpatient Medications  Medication Sig Dispense Refill  . atorvastatin (LIPITOR) 40 MG tablet Take 40 mg by mouth daily.  3  . diphenhydrAMINE (BENADRYL) 25 mg capsule Take 25 mg by mouth every 6 (six) hours as needed for itching.    . divalproex (DEPAKOTE ER) 500 MG 24 hr tablet Take 2 tablets (1,000 mg total) by mouth Nightly. 60 tablet 0  . FLUoxetine (PROZAC) 20 MG capsule Take 1 capsule (20 mg total) by mouth daily. 90 capsule 0  . HYDROcodone-acetaminophen (NORCO/VICODIN) 5-325 MG tablet Take 1 tablet by mouth every 6 (six) hours as needed for moderate pain. 20 tablet 0  . levETIRAcetam (KEPPRA) 750 MG tablet Take 1 tablet (750 mg total) by mouth 2 (two) times daily. 2 tablets 2 times daily. 120 tablet 0  . methocarbamol (ROBAXIN) 500 MG tablet Take 1 or 2 po Q 6hrs for muscle pain (Patient taking differently: Take 500-1,000 mg by mouth every 6 (six) hours as needed for muscle spasms. ) 30 tablet 0  . mirtazapine (REMERON) 30 MG tablet Take 1 tablet (30 mg total) by mouth at bedtime. 90 tablet 0   No current facility-administered medications for this visit.     REVIEW OF SYSTEMS:   Constitutional: Denies fevers, chills or abnormal night sweats.  Mild fatigue. Eyes: Denies blurriness of vision, double vision or watery eyes Ears, nose, mouth, throat, and face:  Denies mucositis or sore throat Respiratory: Denies cough, dyspnea or wheezes Cardiovascular: Denies palpitation, chest discomfort or lower extremity swelling Gastrointestinal:  Denies nausea, heartburn or change in bowel habits.  Has constipation alternating with diarrhea. Skin: Denies abnormal skin rashes Lymphatics: Denies new lymphadenopathy or easy bruising Neurological: Has numbness in the forearms and hands, denies any weakness.  Behavioral/Psych: Mood is stable, no new changes  All other systems were reviewed with the patient and are negative.  PHYSICAL EXAMINATION: ECOG PERFORMANCE STATUS: 0 - Asymptomatic  Vitals:   04/19/18 1140  BP: 119/68  Pulse: 64  Resp: 16  SpO2: 97%   Filed Weights   04/19/18 1140  Weight: 176 lb 6.4 oz (80 kg)    GENERAL:alert, no distress and comfortable SKIN: skin color, texture, turgor are normal, no rashes or significant lesions EYES: normal, conjunctiva are pink and non-injected, sclera clear OROPHARYNX:no exudate, no erythema and lips, buccal mucosa, and tongue normal  NECK: supple, thyroid normal size, non-tender, without nodularity LYMPH:  no palpable lymphadenopathy in the cervical, axillary or inguinal LUNGS: clear to auscultation and percussion with normal breathing effort HEART: regular rate & rhythm and no murmurs and no lower extremity edema ABDOMEN:abdomen soft, non-tender and normal bowel sounds PSYCH: alert & oriented x 3 with fluent speech   LABORATORY DATA:  I have reviewed the data as listed Recent Results (from the past 2160 hour(s))  CBC with Differential/Platelet     Status: Abnormal   Collection Time: 01/24/18  9:26 AM  Result Value Ref Range   WBC 9.3 4.0 - 10.5 K/uL   RBC 5.32 4.22 - 5.81 MIL/uL   Hemoglobin 17.1 (H) 13.0 - 17.0 g/dL   HCT 48.5 39.0 - 52.0 %   MCV 91.2 78.0 - 100.0 fL   MCH 32.1 26.0 - 34.0 pg   MCHC 35.3 30.0 - 36.0 g/dL   RDW 13.6 11.5 - 15.5 %   Platelets 152 150 - 400 K/uL    Neutrophils Relative % 61 %   Neutro Abs 5.7 1.7 - 7.7 K/uL   Lymphocytes Relative 19 %   Lymphs Abs 1.7 0.7 - 4.0 K/uL   Monocytes Relative 8 %   Monocytes Absolute 0.8 0.1 - 1.0 K/uL   Eosinophils Relative 12 %   Eosinophils Absolute 1.1 (H) 0.0 - 0.7 K/uL   Basophils Relative 0 %   Basophils Absolute 0.0 0.0 - 0.1 K/uL    Comment: Performed at [facility], [address], [place], [place] [address]  Comprehensive metabolic panel     Status: Abnormal   Collection Time: 01/24/18  9:26 AM  Result Value Ref Range   Sodium 133 (L) 135 - 145 mmol/L   Potassium 3.9 3.5 - 5.1 mmol/L   Chloride 106 101 - 111 mmol/L   CO2 18 (L) 22 - 32 mmol/L   Glucose, Bld 90 65 - 99 mg/dL   BUN 18 6 - 20 mg/dL   Creatinine, Ser 0.89 0.61 - 1.24 mg/dL   Calcium 8.8 (L) 8.9 - 10.3 mg/dL   Total Protein 7.2 6.5 - 8.1 g/dL   Albumin 4.1 3.5 - 5.0 g/dL   AST 34 15 - 41 U/L   ALT 63 17 - 63 U/L   Alkaline Phosphatase 74 38 - 126 U/L   Total Bilirubin 0.6 0.3 - 1.2 mg/dL   GFR calc non Af Amer >60 >60 mL/min   GFR calc Af Amer >60 >60 mL/min    Comment: (NOTE) The eGFR has been calculated using the CKD EPI equation. This calculation has not been validated in all clinical situations. eGFR's persistently <60 mL/min signify possible Chronic Kidney Disease.    Anion gap 9 5 - 15    Comment: Performed at [facility], [address], [place], [place] [address]  Valproic acid level     Status: Abnormal   Collection Time: 01/24/18  9:26 AM  Result Value Ref Range   Valproic Acid Lvl <10 (L) 50.0 - 100.0 ug/mL    Comment: Performed at [facility], [address], [place], [place] [address]  Prolactin     Status: None   Collection Time: 02/15/18  8:23 PM  Result Value Ref Range   Prolactin 10.4 4.0 - 15.2 ng/mL    Comment: (NOTE) Performed At: [facility] [place], [place] [id number] Rush Farmer MD [id number] Performed at [facility], [address] [place], [place]  [address]   Comprehensive metabolic panel     Status: Abnormal   Collection Time: 02/15/18  8:23 PM  Result Value Ref Range   Sodium 139 135 - 145 mmol/L   Potassium 3.4 (L) 3.5 - 5.1 mmol/L   Chloride 109 101 - 111 mmol/L   CO2 23 22 - 32 mmol/L   Glucose, Bld 117 (H) 65 - 99 mg/dL   BUN 8 6 - 20 mg/dL   Creatinine, Ser 0.91 0.61 - 1.24 mg/dL   Calcium 8.4 (L) 8.9 - 10.3 mg/dL   Total Protein 6.2 (L) 6.5 - 8.1 g/dL   Albumin 3.8 3.5 - 5.0 g/dL   AST 26 15 - 41 U/L   ALT 33 17 - 63 U/L   Alkaline Phosphatase 52 38 - 126 U/L   Total Bilirubin 0.3 0.3 - 1.2 mg/dL   GFR calc non Af Amer >60 >60 mL/min   GFR calc Af Amer >60 >60 mL/min    Comment: (NOTE) The eGFR has been calculated using the CKD EPI equation. This calculation has not been validated in all clinical situations. eGFR's persistently <60 mL/min signify possible Chronic Kidney Disease.    Anion gap 7 5 - 15    Comment: Performed at [facility], [address] [place], [place] [address]  CBC with Differential     Status: None   Collection Time: 02/15/18  8:23 PM  Result Value Ref Range   WBC 7.0 4.0 - 10.5 K/uL   RBC 4.51 4.22 - 5.81 MIL/uL   Hemoglobin 14.2 13.0 - 17.0 g/dL   HCT 41.4 39.0 - 52.0 %   MCV 91.8 78.0 - 100.0 fL   MCH 31.5 26.0 - 34.0 pg   MCHC 34.3 30.0 - 36.0 g/dL   RDW 14.0 11.5 - 15.5 %   Platelets 160 150 - 400 K/uL   Neutrophils Relative % 63 %   Neutro Abs 4.4 1.7 - 7.7 K/uL   Lymphocytes Relative 24 %   Lymphs Abs 1.7 0.7 - 4.0 K/uL   Monocytes Relative 7 %   Monocytes Absolute 0.5 0.1 - 1.0 K/uL   Eosinophils Relative 6 %   Eosinophils Absolute 0.4 0.0 - 0.7 K/uL   Basophils Relative 0 %   Basophils Absolute 0.0 0.0 - 0.1 K/uL    Comment: Performed at [facility], [address] [place], [place] [address]  Magnesium     Status: None   Collection Time: 02/15/18  8:23 PM  Result Value Ref Range   Magnesium 2.1 1.7 - 2.4 mg/dL    Comment: Performed at [facility], [address]  [place], [place] [address]  Phosphorus     Status: None   Collection Time: 02/15/18  8:23 PM  Result Value Ref Range   Phosphorus 2.7 2.5 - 4.6 mg/dL    Comment: Performed at [facility], [address], [place], [place] [address]  Basic metabolic panel     Status: Abnormal  Collection Time: 02/16/18  6:18 AM  Result Value Ref Range   Sodium 140 135 - 145 mmol/L   Potassium 3.8 3.5 - 5.1 mmol/L   Chloride 110 101 - 111 mmol/L   CO2 23 22 - 32 mmol/L   Glucose, Bld 100 (H) 65 - 99 mg/dL   BUN 8 6 - 20 mg/dL   Creatinine, Ser 0.75 0.61 - 1.24 mg/dL   Calcium 8.4 (L) 8.9 - 10.3 mg/dL   GFR calc non Af Amer >60 >60 mL/min   GFR calc Af Amer >60 >60 mL/min    Comment: (NOTE) The eGFR has been calculated using the CKD EPI equation. This calculation has not been validated in all clinical situations. eGFR's persistently <60 mL/min signify possible Chronic Kidney Disease.    Anion gap 7 5 - 15    Comment: Performed at [facility], [address], [place], [place] [address]  CK     Status: None   Collection Time: 02/16/18  6:18 AM  Result Value Ref Range   Total CK 122 49 - 397 U/L    Comment: Performed at [facility], [address], [place], [place] [address]  TSH     Status: None   Collection Time: 02/16/18  6:18 AM  Result Value Ref Range   TSH 2.497 0.350 - 4.500 uIU/mL    Comment: Performed by a 3rd Generation assay with a functional sensitivity of <=0.01 uIU/mL. Performed at [facility], [address], [place], [place] [address]   T4, free     Status: None   Collection Time: 02/16/18  6:18 AM  Result Value Ref Range   Free T4 0.63 0.61 - 1.12 ng/dL    Comment: (NOTE) Biotin ingestion may interfere with free T4 tests. If the results are inconsistent with the TSH level, previous test results, or the clinical presentation, then consider biotin interference. If needed, order repeat testing after stopping biotin. Performed at [facility], [place] [address], [place],  [place] [address]   Urine rapid drug screen (hosp performed)     Status: Abnormal   Collection Time: 02/16/18  7:34 AM  Result Value Ref Range   Opiates NONE DETECTED NONE DETECTED   Cocaine NONE DETECTED NONE DETECTED   Benzodiazepines NONE DETECTED NONE DETECTED   Amphetamines NONE DETECTED NONE DETECTED   Tetrahydrocannabinol POSITIVE (A) NONE DETECTED   Barbiturates NONE DETECTED NONE DETECTED    Comment: (NOTE) DRUG SCREEN FOR MEDICAL PURPOSES ONLY.  IF CONFIRMATION IS NEEDED FOR ANY PURPOSE, NOTIFY LAB WITHIN 5 DAYS. LOWEST DETECTABLE LIMITS FOR URINE DRUG SCREEN Drug Class                     Cutoff (ng/mL) Amphetamine and metabolites    1000 Barbiturate and metabolites    200 Benzodiazepine                 741 Tricyclics and metabolites     300 Opiates and metabolites        300 Cocaine and metabolites        300 THC                            50 Performed at [facility], [address] [place], [place] [address]   Urinalysis, Complete w Microscopic     Status: None   Collection Time: 02/16/18  7:35 AM  Result Value Ref Range   Color, Urine  YELLOW YELLOW   APPearance CLEAR CLEAR   Specific Gravity, Urine 1.019 1.005 - 1.030   pH 6.0 5.0 - 8.0   Glucose, UA NEGATIVE NEGATIVE mg/dL   Hgb urine dipstick NEGATIVE NEGATIVE   Bilirubin Urine NEGATIVE NEGATIVE   Ketones, ur NEGATIVE NEGATIVE mg/dL   Protein, ur NEGATIVE NEGATIVE mg/dL   Nitrite NEGATIVE NEGATIVE   Leukocytes, UA NEGATIVE NEGATIVE    Comment: Performed at [facility], [address], [place], [place] [address]  Culture, Urine     Status: None   Collection Time: 02/16/18  7:35 AM  Result Value Ref Range   Specimen Description      URINE, CLEAN CATCH Performed at [facility], [address], [place], [place] [address]    Special Requests      NONE Performed at [facility], [address] [place], [place] [address]    Culture      NO GROWTH Performed at [facility], [place] [address],  [place], [place] [address]    Report Status 02/17/2018 FINAL   Glucose, capillary     Status: None   Collection Time: 02/16/18  7:48 AM  Result Value Ref Range   Glucose-Capillary 97 65 - 99 mg/dL   Comment 1 Notify RN    Comment 2 Document in Chart   CBC with Differential     Status: None   Collection Time: 03/01/18  5:41 PM  Result Value Ref Range   WBC 6.9 4.0 - 10.5 K/uL   RBC 4.87 4.22 - 5.81 MIL/uL   Hemoglobin 15.4 13.0 - 17.0 g/dL   HCT 45.6 39.0 - 52.0 %   MCV 93.6 78.0 - 100.0 fL   MCH 31.6 26.0 - 34.0 pg   MCHC 33.8 30.0 - 36.0 g/dL   RDW 13.3 11.5 - 15.5 %   Platelets 156 150 - 400 K/uL   Neutrophils Relative % 59 %   Neutro Abs 4.1 1.7 - 7.7 K/uL   Lymphocytes Relative 24 %   Lymphs Abs 1.7 0.7 - 4.0 K/uL   Monocytes Relative 7 %   Monocytes Absolute 0.5 0.1 - 1.0 K/uL   Eosinophils Relative 10 %   Eosinophils Absolute 0.7 0.0 - 0.7 K/uL   Basophils Relative 0 %   Basophils Absolute 0.0 0.0 - 0.1 K/uL    Comment: Performed at [facility], [address], [place], [place] [address]  Comprehensive metabolic panel     Status: Abnormal   Collection Time: 03/01/18  5:41 PM  Result Value Ref Range   Sodium 139 135 - 145 mmol/L   Potassium 4.0 3.5 - 5.1 mmol/L   Chloride 105 101 - 111 mmol/L   CO2 26 22 - 32 mmol/L   Glucose, Bld 86 65 - 99 mg/dL   BUN 12 6 - 20 mg/dL   Creatinine, Ser 0.90 0.61 - 1.24 mg/dL   Calcium 8.3 (L) 8.9 - 10.3 mg/dL   Total Protein 5.7 (L) 6.5 - 8.1 g/dL   Albumin 3.6 3.5 - 5.0 g/dL   AST 17 15 - 41 U/L   ALT 22 17 - 63 U/L   Alkaline Phosphatase 46 38 - 126 U/L   Total Bilirubin 0.5 0.3 - 1.2 mg/dL   GFR calc non Af Amer >60 >60 mL/min   GFR calc Af Amer >60 >60 mL/min    Comment: (NOTE) The eGFR has been calculated using the CKD EPI equation. This calculation has not been validated in all clinical situations.  eGFR's persistently <60 mL/min signify possible Chronic Kidney Disease.    Anion gap 8 5 - 15    Comment: Performed at  [facility], [address] [place], [place] [address]  Valproic acid level     Status: Abnormal   Collection Time: 03/01/18  5:41 PM  Result Value Ref Range   Valproic Acid Lvl 47 (L) 50.0 - 100.0 ug/mL    Comment: Performed at [facility], [address] [place], [place] [address]    RADIOGRAPHIC STUDIES: I have reviewed his chest x-ray from October 2018 as well as CT scan images.  ASSESSMENT & PLAN:  Erythrocytosis 1.  Erythrocytosis: -CBC on 03/19/2018 done at Dr. Josephine Cables office showed hemoglobin of 19.2, hematocrit of 54.3 with elevated red cell count of 5.88.  White count and platelet count were within normal limits.  Differential showed 10.7% eosinophils with increased absolute eosinophil count. - On review of epic system for previous labs, his hemoglobin was found to be elevated in October 2018 in March 2019.  It has been normal other times. -Patient was diagnosed with seizure disorder and was started on Keppra and Depakote. -I have reviewed a chest x-ray from October 2018 which was showing no evidence of cardiopulmonary disease. -He does not have any fevers, night sweats or weight loss.  Does not have any history of thrombosis.  No symptoms of erythromelalgias.  He has itching on his face and neck which lasts about 45 minutes after taking hot showers for the last 2 months. - He was X smoker who smoked 2 pack/day for 20 years and quit 11 years ago.  He started back smoking 2 months ago as his girlfriend also smokes but both of them quit 3 days ago. - We will order CBC, carbon monoxide level and an LDH level.  We will also check a erythropoietin level.  We will order urinalysis to see if there is any microscopic hematuria.  We will also check a Jak 2 V617 mutation testing.  We discussed in detail various causes of erythrocytosis including primary polycythemia versus secondary causes.  We will see him back in 2 weeks to discuss the results.     All questions were answered. The patient  knows to call the clinic with any problems, questions or concerns.     Derek Jack, MD 04/19/18 12:36 PM

## 2018-04-20 LAB — CARBON MONOXIDE, BLOOD (PERFORMED AT REF LAB): Carbon Monoxide, Blood: 3.7 % — ABNORMAL HIGH (ref 0.0–3.6)

## 2018-04-20 LAB — ERYTHROPOIETIN: Erythropoietin: 3.5 m[IU]/mL (ref 2.6–18.5)

## 2018-04-25 LAB — JAK2 GENOTYPR

## 2018-05-11 ENCOUNTER — Ambulatory Visit: Payer: Medicaid Other | Admitting: Neurology

## 2018-05-11 ENCOUNTER — Other Ambulatory Visit: Payer: Self-pay

## 2018-05-11 ENCOUNTER — Encounter: Payer: Self-pay | Admitting: Neurology

## 2018-05-11 VITALS — BP 132/88 | HR 74 | Ht 67.0 in | Wt 192.0 lb

## 2018-05-11 DIAGNOSIS — R569 Unspecified convulsions: Secondary | ICD-10-CM

## 2018-05-11 MED ORDER — LEVETIRACETAM 750 MG PO TABS: ORAL_TABLET | ORAL | 11 refills | Status: DC

## 2018-05-11 MED ORDER — DIVALPROEX SODIUM ER 500 MG PO TB24: ORAL_TABLET | ORAL | 11 refills | Status: DC

## 2018-05-11 NOTE — Progress Notes (Signed)
NEUROLOGY CONSULTATION NOTE  Charles Tyler MRN: [id number] DOB: 11/29/1965  Referring provider: Dr. Shanon Brow Tat Primary care provider: Dr. Rosita Fire  Reason for consult:  syncope  Dear Dr Tat:  Thank you for your kind referral of Charles Tyler for consultation of the above symptoms. Although his history is well known to you, please allow me to reiterate it for the purpose of our medical record. He is alone in the office today. Records and images were personally reviewed where available.  HISTORY OF PRESENT ILLNESS: This is a 52 year old right-handed man with a history of hyperlipidemia, CAD s/p stent placement, depression, anxiety,  recurrent syncope, presenting for evaluation of seizures. He recalls recurrent syncopal episodes since his mid-20s. No prior warning, he would feel weak, tired, and lost after. He recalls talking to his daughter on the phone 13 years ago, she heard a fall and called EMS to his home, they found him on the floor. He reports having a cardiac workup at that time. He was evaluated by neurologist Dr. [name] in December 2018 after another episode in November 2018 where he was bathing his fiancee's child and he was found on the floor unconscious. MRI brain unremarkable. He had overnight video EEG monitoring at that time which was normal, typical events not captured. He was discharged on Keppra. There seemed to be some improvement in seizure activity. His fiancee reported shaking episodes every night, more on the right side, body stiffness, waking up confused. Depakote was added. He lost his insurance and was off medication for 2-3 months, leading to 3 ER visits in April 2019. He had an eyebrow laceration that was repaired. He was admitted on 02/15/18. Per records he was witnessed to have a brief period of shaking, interactive, following commands during the episode. Routine EEG was normal. He was discharged on Keppra 1500mg  BID and Depakote 1000mg  daily. He was back in  the ER on 03/01/18 for another seizure. Depakote level was 47. No medication changes made.   He reports having stages of staring off for a few minutes and feeling lost. This has not occurred in a while. He also has recurrent deja vu episodes occurring 3-4 times a week, he describes this as doing something but feeling like he did it twice. Last episode was a couple of days ago. This is associated with spinning and a metallic taste, like chewing on copper. Most of the time he sits down, they can last from 10-15 minutes up to a few hours. He denies any recent shaking episodes that his fiancee was reporting, but they have not been sleeping in the same bed due to snoring and kicking/punching in sleep since sleep medication was started. He feels shaking inside his body. His fiancee has not mentioned any staring spells. He denies any epigastric sensation, focal numbness/tingling/weakness, myoclonic jerks. He has migraines that are seasonal, with frontal throbbing pain associated with light/sound sensitivity, no nausea/vomiting. Ibuprofen and sleep helps. No dysphagia, dysarthria, neck pain, bowel/bladder dysfunction. He has low back pain and reports his bones hurt with pain in both legs.   Epilepsy Risk Factors:  He had a normal birth and early development.  There is no history of febrile convulsions, CNS infections such as meningitis/encephalitis, significant traumatic brain injury, neurosurgical procedures, or family history of seizures.   PAST MEDICAL HISTORY: Past Medical History:  Diagnosis Date  . Coronary artery disease 08/2017   Stents placed in [place] in 2016. Nonobstructive lesions on 10/18 angiogram.  . Depression with anxiety   .  History of cardiac catheterization    Done in [place] - details not clear  . Hyperlipidemia   . Migraine   . Seizures ([place])     PAST SURGICAL HISTORY: Past Surgical History:  Procedure Laterality Date  . CHOLECYSTECTOMY    . LEFT HEART CATH AND CORONARY  ANGIOGRAPHY N/A 08/23/2017   Procedure: LEFT HEART CATH AND CORONARY ANGIOGRAPHY;  Surgeon: Belva Crome, MD;  Location: [facility];  Service: Cardiovascular;  Laterality: N/A;    MEDICATIONS: Current Outpatient Medications on File Prior to Visit  Medication Sig Dispense Refill  . atorvastatin (LIPITOR) 40 MG tablet Take 40 mg by mouth daily.  3  . diphenhydrAMINE (BENADRYL) 25 mg capsule Take 25 mg by mouth every 6 (six) hours as needed for itching.    . divalproex (DEPAKOTE ER) 500 MG 24 hr tablet Take 2 tablets (1,000 mg total) by mouth Nightly. 60 tablet 0  . FLUoxetine (PROZAC) 20 MG capsule Take 1 capsule (20 mg total) by mouth daily. 90 capsule 0  . HYDROcodone-acetaminophen (NORCO/VICODIN) 5-325 MG tablet Take 1 tablet by mouth every 6 (six) hours as needed for moderate pain. 20 tablet 0  . levETIRAcetam (KEPPRA) 750 MG tablet Take 1 tablet (750 mg total) by mouth 2 (two) times daily. 2 tablets 2 times daily. 120 tablet 0  . methocarbamol (ROBAXIN) 500 MG tablet Take 1 or 2 po Q 6hrs for muscle pain (Patient taking differently: Take 500-1,000 mg by mouth every 6 (six) hours as needed for muscle spasms. ) 30 tablet 0  . mirtazapine (REMERON) 30 MG tablet Take 1 tablet (30 mg total) by mouth at bedtime. 90 tablet 0   No current facility-administered medications on file prior to visit.     ALLERGIES: Allergies  Allergen Reactions  . Aspirin Nausea And Vomiting    325 mg   . Other Hives    Walnuts    FAMILY HISTORY: Family History  Problem Relation Age of Onset  . CAD Father        Premature CAD status post CABG  . Heart attack Father   . COPD Father   . Heart disease Father   . CAD Paternal Grandfather        Premature CAD status post CABG  . Heart disease Paternal Grandfather   . Alcohol abuse Maternal Grandfather   . Heart disease Maternal Grandfather   . Alcohol abuse Maternal Grandmother   . COPD Mother   . Hyperlipidemia Sister   . Asthma Sister   .  Asthma Brother   . Allergies Sister   . Asthma Sister   . Multiple sclerosis Daughter   . Seizures Daughter     SOCIAL HISTORY: Social History   Socioeconomic History  . Marital status: Significant Other    Spouse name: Not on file  . Number of children: 4  . Years of education: 2 years college  . Highest education level: Not on file  Occupational History  . Occupation: Unemployed  Social Needs  . Financial resource strain: Not on file  . Food insecurity:    Worry: Not on file    Inability: Not on file  . Transportation needs:    Medical: Not on file    Non-medical: Not on file  Tobacco Use  . Smoking status: Current Some Day Smoker    Packs/day: 1.00    Types: Cigarettes    Last attempt to quit: 01/05/2018    Years since quitting: 0.3  . Smokeless  tobacco: Never Used  . Tobacco comment: off and on   Substance and Sexual Activity  . Alcohol use: Not Currently    Comment: rarely, 09-14-2017 per pt occas  . Drug use: No    Comment: 09-14-2017 per pt stopped 30 years ago - Cocaine, Marijuana and speed   . Sexual activity: Yes    Birth control/protection: None  Lifestyle  . Physical activity:    Days per week: Not on file    Minutes per session: Not on file  . Stress: Not on file  Relationships  . Social connections:    Talks on phone: Not on file    Gets together: Not on file    Attends religious service: Not on file    Active member of club or organization: Not on file    Attends meetings of clubs or organizations: Not on file    Relationship status: Not on file  . Intimate partner violence:    Fear of current or ex partner: Not on file    Emotionally abused: Not on file    Physically abused: Not on file    Forced sexual activity: Not on file  Other Topics Concern  . Not on file  Social History Narrative   Lives at home with his fiancee.   Right-handed.   2 cups caffeine per day.    REVIEW OF SYSTEMS: Constitutional: No fevers, chills, or sweats, no  generalized fatigue, change in appetite Eyes: No visual changes, double vision, eye pain Ear, nose and throat: No hearing loss, ear pain, nasal congestion, sore throat Cardiovascular: No chest pain, palpitations Respiratory:  No shortness of breath at rest or with exertion, wheezes GastrointestinaI: No nausea, vomiting, diarrhea, abdominal pain, fecal incontinence Genitourinary:  No dysuria, urinary retention or frequency Musculoskeletal:  No neck pain, +back pain Integumentary: No rash, pruritus, skin lesions Neurological: as above Psychiatric: No depression, insomnia, anxiety Endocrine: No palpitations, fatigue, diaphoresis, mood swings, change in appetite, change in weight, increased thirst Hematologic/Lymphatic:  No anemia, purpura, petechiae. Allergic/Immunologic: no itchy/runny eyes, nasal congestion, recent allergic reactions, rashes  PHYSICAL EXAM: Vitals:   05/11/18 0906  BP: 132/88  Pulse: 74  SpO2: 94%   General: No acute distress Head:  Normocephalic/atraumatic Eyes: Fundoscopic exam shows bilateral sharp discs, no vessel changes, exudates, or hemorrhages Neck: supple, no paraspinal tenderness, full range of motion Back: No paraspinal tenderness Heart: regular rate and rhythm Lungs: Clear to auscultation bilaterally. Vascular: No carotid bruits. Skin/Extremities: No rash, no edema Neurological Exam: Mental status: alert and oriented to person, place, and time, no dysarthria or aphasia, Fund of knowledge is appropriate.  Recent and remote memory are intact. 2/3 delayed recall.  Attention and concentration are normal.    Able to name objects and repeat phrases. Cranial nerves: CN I: not tested CN II: pupils equal, round and reactive to light, visual fields intact, fundi unremarkable. CN III, IV, VI:  full range of motion, no nystagmus, no ptosis CN V: facial sensation intact CN VII: upper and lower face symmetric CN VIII: hearing intact to finger rub CN IX, X: gag  intact, uvula midline CN XI: sternocleidomastoid and trapezius muscles intact CN XII: tongue midline Bulk & Tone: normal, no fasciculations. Motor: 5/5 throughout with no pronator drift. Sensation: intact to light touch, cold, pin, vibration and joint position sense.  No extinction to double simultaneous stimulation.  Romberg test negative Deep Tendon Reflexes: +2 throughout, no ankle clonus Plantar responses: downgoing bilaterally Cerebellar: no incoordination on finger to  nose, heel to shin. No dysdiadochokinesia Gait: narrow-based and steady, able to tandem walk adequately. Tremor: none  IMPRESSION: This is a 52 year old right-handed man with a history of CAD s/p stent placement, depression, anxiety,  recurrent syncope, presenting for evaluation of seizures. He describes recurrent episodes of metallic taste with deja vu. The nocturnal shaking episodes appear to have decreased with Keppra and Depakote. Concern has been raised about non-epileptic seizures in the ER, however some of the symptoms he describes are suggestive of focal seizures arising from the temporal lobe. MRI brain and overnight EEG normal, however typical events not captured. He continues to report deja vu and metallic taste 3-4 times a week. A 72-hour EEG will be ordered to further classify his seizures. Continue current doses of Depakote ER 1000mg  daily and Keppra 1500mg  BID. Continue follow-up with Behavioral Health. He is aware of [place] driving laws to stop driving after a seizure until 6 months seizure-free. He will follow-up after the EEG and knows to call for any changes.   Thank you for allowing me to participate in the care of this patient. Please do not hesitate to call for any questions or concerns.   Ellouise Newer, M.D.  CC: Dr. Carles Collet, Dr. Legrand Rams

## 2018-05-11 NOTE — Patient Instructions (Signed)
1. Schedule 72-hour EEG 2. Continue Depakote ER 500mg : take 2 tablets daily, Keppra 750mg : Take 2 tablets twice a day 3. Continue follow-up with Behavioral Health 4. Follow-up after EEG, call for any changes  Seizure Precautions: 1. If medication has been prescribed for you to prevent seizures, take it exactly as directed.  Do not stop taking the medicine without talking to your doctor first, even if you have not had a seizure in a long time.   2. Avoid activities in which a seizure would cause danger to yourself or to others.  Don't operate dangerous machinery, swim alone, or climb in high or dangerous places, such as on ladders, roofs, or girders.  Do not drive unless your doctor says you may.  3. If you have any warning that you may have a seizure, lay down in a safe place where you can't hurt yourself.    4.  No driving for 6 months from last seizure, as per [facility].   Please refer to the following link on the [place] website for more information: http://[email or web address].org/[email or web address]/driving/drivingu.cfm   5.  Maintain good sleep hygiene. Avoid alcohol.  6.  Contact your doctor if you have any problems that may be related to the medicine you are taking.  7.  Call 911 and bring the patient back to the ED if:        A.  The seizure lasts longer than 5 minutes.       B.  The patient doesn't awaken shortly after the seizure  C.  The patient has new problems such as difficulty seeing, speaking or moving  D.  The patient was injured during the seizure  E.  The patient has a temperature over 102 F (39C)  F.  The patient vomited and now is having trouble breathing

## 2018-05-17 ENCOUNTER — Ambulatory Visit (HOSPITAL_COMMUNITY): Payer: Medicaid Other | Admitting: Licensed Clinical Social Worker

## 2018-05-17 ENCOUNTER — Encounter (HOSPITAL_COMMUNITY): Payer: Self-pay | Admitting: Licensed Clinical Social Worker

## 2018-05-17 DIAGNOSIS — F331 Major depressive disorder, recurrent, moderate: Secondary | ICD-10-CM

## 2018-05-17 NOTE — Progress Notes (Signed)
   THERAPIST PROGRESS NOTE  Session Time: 9:00 am-9:45 am  Participation Level: Active  Behavioral Response: CasualAlertAnxious and Depressed  Type of Therapy: Individual Therapy  Treatment Goals addressed: Coping  Interventions: CBT and Solution Focused  Summary: Charles Tyler is a 52 y.o. male who presents oriented x5 (person, place, situation, time and object), alert, anxious, casually dressed, appropriately groomed, average height, average weight, and cooperative to address mood and anxiety. Patient has a history of medical history including seizures. Patient has no history of mental health treatment. Patient denies symptoms of mania. Patient denies suicidal and homicidal ideations. Patient denies psychosis including auditory and visual hallucinations. Patient denies current substance abuse. Patient is at low risk for lethality.   Physical: Patient has low energy. He is sleeping later in the morning. He has another visit with the hematologist this week to find out what is going on. Patient is managing his seizures.  Spiritual/Values: No issue identified.  Relationships: Patient reported that his relationship is going ok. She is more snappy and has morning sickness. He is spending most of her time in bed and is having seizures.  Emotional/Mental/Behavior: Patient's mood is fair. Patient is balancing his role as a parent with his girlfriend's children. He is looking for a job.   Patient engaged in session. He responded well to interventions. Patient continues to met criteria for Major depressive disorder, recurrent episode, moderate with anxious distress. Patient will continue in outpatient therapy due to being the least restrictive service to meet his needs at this time. Patient made minimal progress on his goals.  Suicidal/Homicidal: Negativewithout intent/plan  Therapist Response: Therapist reviewed patient's recent thoughts and behaviors. Therapist utilized CBT to address mood.  Therapist processed patient's feelings to identify triggers for mood. Therapist explored patient's stressors.   Plan: Return again in 3-4 weeks.  Diagnosis: Axis I: Major depressive disorder, recurrent episode, moderate with anxious distress    Axis II: No diagnosis    Glori Bickers, LCSW 05/17/2018

## 2018-05-18 ENCOUNTER — Inpatient Hospital Stay (HOSPITAL_COMMUNITY): Payer: Medicaid Other

## 2018-05-18 ENCOUNTER — Encounter (HOSPITAL_COMMUNITY): Payer: Self-pay | Admitting: Hematology

## 2018-05-18 ENCOUNTER — Other Ambulatory Visit: Payer: Self-pay

## 2018-05-18 ENCOUNTER — Inpatient Hospital Stay (HOSPITAL_COMMUNITY): Payer: Medicaid Other | Attending: Hematology | Admitting: Hematology

## 2018-05-18 VITALS — BP 125/81 | HR 65 | Temp 98.1°F | Resp 16 | Wt 191.0 lb

## 2018-05-18 DIAGNOSIS — G40909 Epilepsy, unspecified, not intractable, without status epilepticus: Secondary | ICD-10-CM

## 2018-05-18 DIAGNOSIS — D751 Secondary polycythemia: Secondary | ICD-10-CM | POA: Diagnosis not present

## 2018-05-18 DIAGNOSIS — Z720 Tobacco use: Secondary | ICD-10-CM | POA: Diagnosis not present

## 2018-05-18 DIAGNOSIS — D696 Thrombocytopenia, unspecified: Secondary | ICD-10-CM | POA: Diagnosis not present

## 2018-05-18 NOTE — Assessment & Plan Note (Signed)
1.  Erythrocytosis: -CBC on 03/19/2018 done at Dr. Josephine Cables office showed hemoglobin of 19.2, hematocrit of 54.3 with elevated red cell count of 5.88.  White count and platelet count were within normal limits.  Differential showed 10.7% eosinophils with increased absolute eosinophil count. - On review of epic system for previous labs, his hemoglobin was found to be elevated in October 2018 in March 2019.  It has been normal other times. -Patient was diagnosed with seizure disorder and was started on Keppra and Depakote. -I have reviewed a chest x-ray from October 2018 which was showing no evidence of cardiopulmonary disease. -He does not have any fevers, night sweats or weight loss.  Does not have any history of thrombosis.  No symptoms of erythromelalgias.  He has itching on his face and neck which lasts about 45 minutes after taking hot showers for the last 2 months. - He was ex-smoker who smoked 2 pack/day for 20 years and quit 11 years ago.  He started back smoking 2 months ago as his girlfriend also smokes but both of them quit for the last 4 weeks. - I have repeated labs.  His hemoglobin is elevated at 18.1.  LDH was within normal limits.  Jak 2 mutation testing was negative.  However erythropoietin was low at 3.5.  Urinalysis did not show any hematuria. - I have recommended further testing for Jak 2 exon 12-15 mutations, CALR mutation testing.  We will also repeat a CBC with differential.  If his CBC shows continued elevated hemoglobin, he will require a bone marrow aspiration biopsy.  I discussed the procedure with him in detail.  We will tentatively schedule him for the procedure.

## 2018-05-18 NOTE — Progress Notes (Signed)
[place], [place] [address]   CLINIC:  Medical Oncology/Hematology  PCP:  Rosita Fire, MD [place] [id number] [phone]   REASON FOR VISIT:  Follow-up for erythrocytosis  CURRENT THERAPY: Observation   INTERVAL HISTORY:  Mr. Feutz 52 y.o. male returns for routine follow-up for erythrocytosis. Patient reported that he has not smoked cigarettes since his last visit in June. Patient denies nausea, vomiting, or diarrhea. Denies any new pains. Denies any fever or infections recently. His energy levels have been low around 25%, however his appetite is remaining good at 75%     REVIEW OF SYSTEMS:  Review of Systems  Constitutional: Positive for fatigue.  HENT:  Negative.   Eyes: Negative.   Respiratory: Negative.   Cardiovascular: Negative.   Gastrointestinal: Negative.   Endocrine: Negative.   Genitourinary: Negative.    Skin: Negative.   Neurological: Positive for dizziness and extremity weakness.  Hematological: Negative.   Psychiatric/Behavioral: Negative.      PAST MEDICAL/SURGICAL HISTORY:  Past Medical History:  Diagnosis Date  . Coronary artery disease 08/2017   Stents placed in [place] in 2016. Nonobstructive lesions on 10/18 angiogram.  . Depression with anxiety   . History of cardiac catheterization    Done in [place] - details not clear  . Hyperlipidemia   . Migraine   . Seizures ([place])    Past Surgical History:  Procedure Laterality Date  . CHOLECYSTECTOMY    . LEFT HEART CATH AND CORONARY ANGIOGRAPHY N/A 08/23/2017   Procedure: LEFT HEART CATH AND CORONARY ANGIOGRAPHY;  Surgeon: Belva Crome, MD;  Location: [facility];  Service: Cardiovascular;  Laterality: N/A;     SOCIAL HISTORY:  Social History   Socioeconomic History  . Marital status: Significant Other    Spouse name: Not on file  . Number of children: 4  . Years of education: 2 years college  .  Highest education level: Not on file  Occupational History  . Occupation: Unemployed  Social Needs  . Financial resource strain: Not on file  . Food insecurity:    Worry: Not on file    Inability: Not on file  . Transportation needs:    Medical: Not on file    Non-medical: Not on file  Tobacco Use  . Smoking status: Current Some Day Smoker    Packs/day: 1.00    Types: Cigarettes    Last attempt to quit: 01/05/2018    Years since quitting: 0.3  . Smokeless tobacco: Never Used  . Tobacco comment: off and on   Substance and Sexual Activity  . Alcohol use: Not Currently    Comment: rarely, 09-14-2017 per pt occas  . Drug use: No    Comment: 09-14-2017 per pt stopped 30 years ago - Cocaine, Marijuana and speed   . Sexual activity: Yes    Birth control/protection: None  Lifestyle  . Physical activity:    Days per week: Not on file    Minutes per session: Not on file  . Stress: Not on file  Relationships  . Social connections:    Talks on phone: Not on file    Gets together: Not on file    Attends religious service: Not on file    Active member of club or organization: Not on file    Attends meetings of clubs or organizations: Not on file    Relationship status: Not on file  . Intimate partner violence:  Fear of current or ex partner: Not on file    Emotionally abused: Not on file    Physically abused: Not on file    Forced sexual activity: Not on file  Other Topics Concern  . Not on file  Social History Narrative   Lives at home with his fiancee.   Right-handed.   2 cups caffeine per day.    FAMILY HISTORY:  Family History  Problem Relation Age of Onset  . CAD Father        Premature CAD status post CABG  . Heart attack Father   . COPD Father   . Heart disease Father   . CAD Paternal Grandfather        Premature CAD status post CABG  . Heart disease Paternal Grandfather   . Alcohol abuse Maternal Grandfather   . Heart disease Maternal Grandfather   . Alcohol  abuse Maternal Grandmother   . COPD Mother   . Hyperlipidemia Sister   . Asthma Sister   . Asthma Brother   . Allergies Sister   . Asthma Sister   . Multiple sclerosis Daughter   . Seizures Daughter     CURRENT MEDICATIONS:  Outpatient Encounter Medications as of 05/18/2018  Medication Sig Note  . atorvastatin (LIPITOR) 40 MG tablet Take 40 mg by mouth daily.   . diphenhydrAMINE (BENADRYL) 25 mg capsule Take 25 mg by mouth every 6 (six) hours as needed for itching.   . divalproex (DEPAKOTE ER) 500 MG 24 hr tablet Take 2 tablets daily   . FLUoxetine (PROZAC) 20 MG capsule Take 1 capsule (20 mg total) by mouth daily.   Marland Kitchen levETIRAcetam (KEPPRA) 750 MG tablet Take 2 tablets twice a day   . methocarbamol (ROBAXIN) 500 MG tablet Take 1 or 2 po Q 6hrs for muscle pain (Patient taking differently: Take 500-1,000 mg by mouth every 6 (six) hours as needed for muscle spasms. ) 02/14/2018: Has not yet filled  . mirtazapine (REMERON) 30 MG tablet Take 1 tablet (30 mg total) by mouth at bedtime.    No facility-administered encounter medications on file as of 05/18/2018.     ALLERGIES:  Allergies  Allergen Reactions  . Aspirin Nausea And Vomiting    325 mg   . Other Hives    Walnuts     PHYSICAL EXAM:  ECOG Performance status: 1  Vitals:   05/18/18 1045  BP: 125/81  Pulse: 65  Resp: 16  Temp: 98.1 F (36.7 C)  SpO2: 96%   Filed Weights   05/18/18 1045  Weight: 191 lb (86.6 kg)    Physical Exam   LABORATORY DATA:  I have reviewed the labs as listed.  CBC    Component Value Date/Time   WBC 9.7 04/19/2018 1237   RBC 5.57 04/19/2018 1237   HGB 18.1 (H) 04/19/2018 1237   HCT 52.5 (H) 04/19/2018 1237   PLT 143 (L) 04/19/2018 1237   MCV 94.3 04/19/2018 1237   MCH 32.5 04/19/2018 1237   MCHC 34.5 04/19/2018 1237   RDW 12.9 04/19/2018 1237   LYMPHSABS 1.8 04/19/2018 1237   MONOABS 0.7 04/19/2018 1237   EOSABS 1.1 (H) 04/19/2018 1237   BASOSABS 0.0 04/19/2018 1237   CMP  Latest Ref Rng & Units 03/01/2018 02/16/2018 02/15/2018  Glucose 65 - 99 mg/dL 86 100(H) 117(H)  BUN 6 - 20 mg/dL 12 8 8   Creatinine 0.61 - 1.24 mg/dL 0.90 0.75 0.91  Sodium 135 - 145 mmol/L 139 140 139  Potassium 3.5 - 5.1 mmol/L 4.0 3.8 3.4(L)  Chloride 101 - 111 mmol/L 105 110 109  CO2 22 - 32 mmol/L 26 23 23   Calcium 8.9 - 10.3 mg/dL 8.3(L) 8.4(L) 8.4(L)  Total Protein 6.5 - 8.1 g/dL 5.7(L) - 6.2(L)  Total Bilirubin 0.3 - 1.2 mg/dL 0.5 - 0.3  Alkaline Phos 38 - 126 U/L 46 - 52  AST 15 - 41 U/L 17 - 26  ALT 17 - 63 U/L 22 - 33        ASSESSMENT & PLAN:   Erythrocytosis 1.  Erythrocytosis: -CBC on 03/19/2018 done at Dr. Josephine Cables office showed hemoglobin of 19.2, hematocrit of 54.3 with elevated red cell count of 5.88.  White count and platelet count were within normal limits.  Differential showed 10.7% eosinophils with increased absolute eosinophil count. - On review of epic system for previous labs, his hemoglobin was found to be elevated in October 2018 in March 2019.  It has been normal other times. -Patient was diagnosed with seizure disorder and was started on Keppra and Depakote. -I have reviewed a chest x-ray from October 2018 which was showing no evidence of cardiopulmonary disease. -He does not have any fevers, night sweats or weight loss.  Does not have any history of thrombosis.  No symptoms of erythromelalgias.  He has itching on his face and neck which lasts about 45 minutes after taking hot showers for the last 2 months. - He was ex-smoker who smoked 2 pack/day for 20 years and quit 11 years ago.  He started back smoking 2 months ago as his girlfriend also smokes but both of them quit for the last 4 weeks. - I have repeated labs.  His hemoglobin is elevated at 18.1.  LDH was within normal limits.  Jak 2 mutation testing was negative.  However erythropoietin was low at 3.5.  Urinalysis did not show any hematuria. - I have recommended further testing for Jak 2 exon 12-15  mutations, CALR mutation testing.  We will also repeat a CBC with differential.  If his CBC shows continued elevated hemoglobin, he will require a bone marrow aspiration biopsy.  I discussed the procedure with him in detail.  We will tentatively schedule him for the procedure.      Orders placed this encounter:  Orders Placed This Encounter  Procedures  . CBC with Differential  . MPL mutation analysis      Derek Jack, MD [place] [phone]

## 2018-05-21 ENCOUNTER — Inpatient Hospital Stay (HOSPITAL_COMMUNITY): Payer: Medicaid Other

## 2018-05-21 ENCOUNTER — Ambulatory Visit (INDEPENDENT_AMBULATORY_CARE_PROVIDER_SITE_OTHER): Payer: Medicaid Other | Admitting: Neurology

## 2018-05-21 DIAGNOSIS — R569 Unspecified convulsions: Secondary | ICD-10-CM

## 2018-05-21 DIAGNOSIS — D751 Secondary polycythemia: Secondary | ICD-10-CM | POA: Diagnosis not present

## 2018-05-21 LAB — CBC WITH DIFFERENTIAL/PLATELET
BASOS PCT: 0 %
Basophils Absolute: 0 10*3/uL (ref 0.0–0.1)
EOS ABS: 1 10*3/uL — AB (ref 0.0–0.7)
Eosinophils Relative: 13 %
HEMATOCRIT: 47.4 % (ref 39.0–52.0)
Hemoglobin: 17 g/dL (ref 13.0–17.0)
Lymphocytes Relative: 24 %
Lymphs Abs: 1.9 10*3/uL (ref 0.7–4.0)
MCH: 32.9 pg (ref 26.0–34.0)
MCHC: 35.9 g/dL (ref 30.0–36.0)
MCV: 91.9 fL (ref 78.0–100.0)
MONO ABS: 0.7 10*3/uL (ref 0.1–1.0)
MONOS PCT: 8 %
NEUTROS ABS: 4.5 10*3/uL (ref 1.7–7.7)
Neutrophils Relative %: 55 %
Platelets: 146 10*3/uL — ABNORMAL LOW (ref 150–400)
RBC: 5.16 MIL/uL (ref 4.22–5.81)
RDW: 13.3 % (ref 11.5–15.5)
WBC: 8.2 10*3/uL (ref 4.0–10.5)

## 2018-05-22 DIAGNOSIS — R569 Unspecified convulsions: Secondary | ICD-10-CM | POA: Diagnosis not present

## 2018-05-23 DIAGNOSIS — R569 Unspecified convulsions: Secondary | ICD-10-CM | POA: Diagnosis not present

## 2018-05-25 LAB — CALRETICULIN (CALR) MUTATION ANALYSIS

## 2018-05-28 LAB — JAK2 EXONS 12-15

## 2018-05-30 ENCOUNTER — Inpatient Hospital Stay (HOSPITAL_COMMUNITY): Payer: Medicaid Other

## 2018-05-30 ENCOUNTER — Encounter (HOSPITAL_COMMUNITY): Payer: Self-pay | Admitting: Hematology

## 2018-05-30 ENCOUNTER — Inpatient Hospital Stay (HOSPITAL_BASED_OUTPATIENT_CLINIC_OR_DEPARTMENT_OTHER): Payer: Medicaid Other | Admitting: Hematology

## 2018-05-30 VITALS — BP 128/83 | HR 66 | Temp 98.6°F | Resp 18

## 2018-05-30 DIAGNOSIS — D751 Secondary polycythemia: Secondary | ICD-10-CM

## 2018-05-30 DIAGNOSIS — D696 Thrombocytopenia, unspecified: Secondary | ICD-10-CM | POA: Diagnosis not present

## 2018-05-30 LAB — CBC WITH DIFFERENTIAL/PLATELET
BASOS ABS: 0 10*3/uL (ref 0.0–0.1)
Basophils Relative: 0 %
Eosinophils Absolute: 1 10*3/uL — ABNORMAL HIGH (ref 0.0–0.7)
Eosinophils Relative: 12 %
HEMATOCRIT: 48 % (ref 39.0–52.0)
Hemoglobin: 17.4 g/dL — ABNORMAL HIGH (ref 13.0–17.0)
LYMPHS ABS: 1.7 10*3/uL (ref 0.7–4.0)
LYMPHS PCT: 20 %
MCH: 33 pg (ref 26.0–34.0)
MCHC: 36.3 g/dL — ABNORMAL HIGH (ref 30.0–36.0)
MCV: 91.1 fL (ref 78.0–100.0)
MONO ABS: 0.7 10*3/uL (ref 0.1–1.0)
Monocytes Relative: 8 %
NEUTROS ABS: 5.2 10*3/uL (ref 1.7–7.7)
Neutrophils Relative %: 60 %
Platelets: 159 10*3/uL (ref 150–400)
RBC: 5.27 MIL/uL (ref 4.22–5.81)
RDW: 12.9 % (ref 11.5–15.5)
WBC: 8.6 10*3/uL (ref 4.0–10.5)

## 2018-05-30 NOTE — Assessment & Plan Note (Signed)
1.  Erythrocytosis: -CBC on 03/19/2018 done at Dr. Josephine Cables office showed hemoglobin of 19.2, hematocrit of 54.3 with elevated red cell count of 5.88.  White count and platelet count were within normal limits.  Differential showed 10.7% eosinophils with increased absolute eosinophil count. - On review of epic system for previous labs, his hemoglobin was found to be elevated in October 2018 in March 2019.  It has been normal other times. -Patient was diagnosed with seizure disorder and was started on Keppra and Depakote. -I have reviewed a chest x-ray from October 2018 which was showing no evidence of cardiopulmonary disease. -He does not have any fevers, night sweats or weight loss.  Does not have any history of thrombosis.  No symptoms of erythromelalgias.  He has itching on his face and neck which lasts about 45 minutes after taking hot showers for the last 2 months. - He was ex-smoker who smoked 2 pack/day for 20 years and quit 11 years ago.  He started back smoking 2 months ago as his girlfriend also smokes but both of them quit for the last 4 weeks. - I have repeated labs.  His hemoglobin is elevated at 18.1.  LDH was within normal limits.  Jak 2 mutation testing was negative.  However erythropoietin was low at 3.5.  Urinalysis did not show any hematuria. - He was also negative for other MPN mutations including CALR, Jak exon 12-15.  Erythropoietin being low is suspicious for polycythemia.  He also has mild thrombocytopenia over many years.  We will proceed with bone marrow aspiration and biopsy today.  We discussed the risks of the procedure in detail.  He will come back in 2 weeks for follow-up.

## 2018-05-30 NOTE — Progress Notes (Signed)
[place], [place] [id number]   CLINIC:  Medical Oncology/Hematology  PCP:  Charles Fire, MD [place] [id number] [phone]   REASON FOR VISIT:  Follow-up for erythrocytosis and thrombocytopenia.   INTERVAL HISTORY:  Charles Tyler 52 y.o. male returns for follow-up of erythrocytosis and thrombocytopenia.  We have done additional testing at his last visit.  He quit smoking more than a month ago.  Denies any headaches or vision changes.    REVIEW OF SYSTEMS:  Review of Systems  Constitutional: Negative.   HENT:  Negative.   Respiratory: Negative.   Cardiovascular: Negative.   Gastrointestinal: Negative.   Genitourinary: Negative.    Musculoskeletal: Negative.   Skin: Negative.   Neurological: Negative.   Hematological: Negative.   Psychiatric/Behavioral: Negative.      PAST MEDICAL/SURGICAL HISTORY:  Past Medical History:  Diagnosis Date  . Coronary artery disease 08/2017   Stents placed in [place] in 2016. Nonobstructive lesions on 10/18 angiogram.  . Depression with anxiety   . History of cardiac catheterization    Done in [place] - details not clear  . Hyperlipidemia   . Migraine   . Seizures ([place])    Past Surgical History:  Procedure Laterality Date  . CHOLECYSTECTOMY    . LEFT HEART CATH AND CORONARY ANGIOGRAPHY N/A 08/23/2017   Procedure: LEFT HEART CATH AND CORONARY ANGIOGRAPHY;  Surgeon: Charles Crome, MD;  Location: [facility];  Service: Cardiovascular;  Laterality: N/A;     SOCIAL HISTORY:  Social History   Socioeconomic History  . Marital status: Significant Other    Spouse name: Not on file  . Number of children: 4  . Years of education: 2 years college  . Highest education level: Not on file  Occupational History  . Occupation: Unemployed  Social Needs  . Financial resource strain: Not on file  . Food insecurity:    Worry: Not on file    Inability: Not on  file  . Transportation needs:    Medical: Not on file    Non-medical: Not on file  Tobacco Use  . Smoking status: Current Some Day Smoker    Packs/day: 1.00    Types: Cigarettes    Last attempt to quit: 01/05/2018    Years since quitting: 0.3  . Smokeless tobacco: Never Used  . Tobacco comment: off and on   Substance and Sexual Activity  . Alcohol use: Not Currently    Comment: rarely, 09-14-2017 per pt occas  . Drug use: No    Comment: 09-14-2017 per pt stopped 30 years ago - Cocaine, Marijuana and speed   . Sexual activity: Yes    Birth control/protection: None  Lifestyle  . Physical activity:    Days per week: Not on file    Minutes per session: Not on file  . Stress: Not on file  Relationships  . Social connections:    Talks on phone: Not on file    Gets together: Not on file    Attends religious service: Not on file    Active member of club or organization: Not on file    Attends meetings of clubs or organizations: Not on file    Relationship status: Not on file  . Intimate partner violence:    Fear of current or ex partner: Not on file    Emotionally abused: Not on file    Physically abused: Not on file    Forced sexual  activity: Not on file  Other Topics Concern  . Not on file  Social History Narrative   Lives at home with his fiancee.   Right-handed.   2 cups caffeine per day.    FAMILY HISTORY:  Family History  Problem Relation Age of Onset  . CAD Father        Premature CAD status post CABG  . Heart attack Father   . COPD Father   . Heart disease Father   . CAD Paternal Grandfather        Premature CAD status post CABG  . Heart disease Paternal Grandfather   . Alcohol abuse Maternal Grandfather   . Heart disease Maternal Grandfather   . Alcohol abuse Maternal Grandmother   . COPD Mother   . Hyperlipidemia Sister   . Asthma Sister   . Asthma Brother   . Allergies Sister   . Asthma Sister   . Multiple sclerosis Daughter   . Seizures Daughter      CURRENT MEDICATIONS:  Outpatient Encounter Medications as of 05/30/2018  Medication Sig Note  . atorvastatin (LIPITOR) 40 MG tablet Take 40 mg by mouth daily.   . diphenhydrAMINE (BENADRYL) 25 mg capsule Take 25 mg by mouth every 6 (six) hours as needed for itching.   . divalproex (DEPAKOTE ER) 500 MG 24 hr tablet Take 2 tablets daily   . FLUoxetine (PROZAC) 20 MG capsule Take 1 capsule (20 mg total) by mouth daily.   Marland Kitchen levETIRAcetam (KEPPRA) 750 MG tablet Take 2 tablets twice a day   . methocarbamol (ROBAXIN) 500 MG tablet Take 1 or 2 po Q 6hrs for muscle pain (Patient taking differently: Take 500-1,000 mg by mouth every 6 (six) hours as needed for muscle spasms. ) 02/14/2018: Has not yet filled  . mirtazapine (REMERON) 30 MG tablet Take 1 tablet (30 mg total) by mouth at bedtime.    No facility-administered encounter medications on file as of 05/30/2018.     ALLERGIES:  Allergies  Allergen Reactions  . Aspirin Nausea And Vomiting    325 mg   . Other Hives    Walnuts     PHYSICAL EXAM:  ECOG Performance status: 0  I have reviewed his vitals. Physical Exam   LABORATORY DATA:  I have reviewed the labs as listed.  CBC    Component Value Date/Time   WBC 8.2 05/21/2018 0700   RBC 5.16 05/21/2018 0700   HGB 17.0 05/21/2018 0700   HCT 47.4 05/21/2018 0700   PLT 146 (L) 05/21/2018 0700   MCV 91.9 05/21/2018 0700   MCH 32.9 05/21/2018 0700   MCHC 35.9 05/21/2018 0700   RDW 13.3 05/21/2018 0700   LYMPHSABS 1.9 05/21/2018 0700   MONOABS 0.7 05/21/2018 0700   EOSABS 1.0 (H) 05/21/2018 0700   BASOSABS 0.0 05/21/2018 0700   CMP Latest Ref Rng & Units 03/01/2018 02/16/2018 02/15/2018  Glucose 65 - 99 mg/dL 86 100(H) 117(H)  BUN 6 - 20 mg/dL 12 8 8   Creatinine 0.61 - 1.24 mg/dL 0.90 0.75 0.91  Sodium 135 - 145 mmol/L 139 140 139  Potassium 3.5 - 5.1 mmol/L 4.0 3.8 3.4(L)  Chloride 101 - 111 mmol/L 105 110 109  CO2 22 - 32 mmol/L 26 23 23   Calcium 8.9 - 10.3 mg/dL 8.3(L)  8.4(L) 8.4(L)  Total Protein 6.5 - 8.1 g/dL 5.7(L) - 6.2(L)  Total Bilirubin 0.3 - 1.2 mg/dL 0.5 - 0.3  Alkaline Phos 38 - 126 U/L 46 - 52  AST  15 - 41 U/L 17 - 26  ALT 17 - 63 U/L 22 - 33         ASSESSMENT & PLAN:   Erythrocytosis 1.  Erythrocytosis: -CBC on 03/19/2018 done at Dr. Josephine Tyler office showed hemoglobin of 19.2, hematocrit of 54.3 with elevated red cell count of 5.88.  White count and platelet count were within normal limits.  Differential showed 10.7% eosinophils with increased absolute eosinophil count. - On review of epic system for previous labs, his hemoglobin was found to be elevated in October 2018 in March 2019.  It has been normal other times. -Patient was diagnosed with seizure disorder and was started on Keppra and Depakote. -I have reviewed a chest x-ray from October 2018 which was showing no evidence of cardiopulmonary disease. -He does not have any fevers, night sweats or weight loss.  Does not have any history of thrombosis.  No symptoms of erythromelalgias.  He has itching on his face and neck which lasts about 45 minutes after taking hot showers for the last 2 months. - He was ex-smoker who smoked 2 pack/day for 20 years and quit 11 years ago.  He started back smoking 2 months ago as his girlfriend also smokes but both of them quit for the last 4 weeks. - I have repeated labs.  His hemoglobin is elevated at 18.1.  LDH was within normal limits.  Jak 2 mutation testing was negative.  However erythropoietin was low at 3.5.  Urinalysis did not show any hematuria. - He was also negative for other MPN mutations including CALR, Jak exon 12-15.  Erythropoietin being low is suspicious for polycythemia.  He also has mild thrombocytopenia over many years.  We will proceed with bone marrow aspiration and biopsy today.  We discussed the risks of the procedure in detail.  He will come back in 2 weeks for follow-up.      Orders placed this encounter:  No orders of the  defined types were placed in this encounter.     Derek Jack, MD [place] [phone]

## 2018-05-30 NOTE — Patient Instructions (Signed)
[place] at [facility] Discharge Instructions     Bone Marrow Aspiration and Bone Marrow Biopsy, Adult, Care After This sheet gives you information about how to care for yourself after your procedure. Your health care provider may also give you more specific instructions. If you have problems or questions, contact your health care provider. What can I expect after the procedure? After the procedure, it is common to have:  Mild pain and tenderness.  Swelling.  Bruising.  Follow these instructions at home:    Take over-the-counter or prescription medicines only as told by your health care provider.  Do not take baths, swim, or use a hot tub until your health care provider approves. Ask if you can take a shower or have a sponge bath.  Follow instructions from your health care provider about how to take care of the puncture site. Make sure you: ? Wash your hands with soap and water before you change your bandage (dressing). If soap and water are not available, use hand sanitizer. ? Change your dressing as told by your health care provider.  Check your puncture siteevery day for signs of infection. Check for: ? More redness, swelling, or pain. ? More fluid or blood. ? Warmth. ? Pus or a bad smell.  Return to your normal activities as told by your health care provider. Ask your health care provider what activities are safe for you.  Do not drive for 24 hours if you were given a medicine to help you relax (sedative).  Keep all follow-up visits as told by your health care provider. This is important. Contact a health care provider if:  You have more redness, swelling, or pain around the puncture site.  You have more fluid or blood coming from the puncture site.  Your puncture site feels warm to the touch.  You have pus or a bad smell coming from the puncture site.  You have a fever.  Your pain is not controlled with medicine. This information  is not intended to replace advice given to you by your health care provider. Make sure you discuss any questions you have with your health care provider. Document Released: 05/13/2005 Document Revised: 05/13/2016 Document Reviewed: 04/06/2016 Elsevier Interactive Patient Education  2018 [employer].                   Bone marrow biopsy done today.   Thank you for choosing [place] at [facility] to provide your oncology and hematology care.  To afford each patient quality time with our provider, please arrive at least 15 minutes before your scheduled appointment time.   If you have a lab appointment with the [facility] please come in thru the  Main Entrance and check in at the main information desk  You need to re-schedule your appointment should you arrive 10 or more minutes late.  We strive to give you quality time with our providers, and arriving late affects you and other patients whose appointments are after yours.  Also, if you no show three or more times for appointments you may be dismissed from the clinic at the providers discretion.     Again, thank you for choosing [facility].  Our hope is that these requests will decrease the amount of time that you wait before being seen by our physicians.       _____________________________________________________________  Should you have questions after your visit to [facility],  please contact our office at [phone] between the hours of 8:30 a.m. and 4:30 p.m.  Voicemails left after 4:30 p.m. will not be returned until the following business day.  For prescription refill requests, have your pharmacy contact our office.       Resources For Cancer Patients and their Caregivers ? American Cancer Society: Can assist with transportation, wigs, general needs, runs Look Good Feel Better.        936 567 1989 ? Cancer Care: Provides financial assistance, online support  groups, medication/co-pay assistance.  1-800-813-HOPE (585)587-0970) ? [facility] Assists [place] cancer patients and their families through emotional , educational and financial support.  272-426-1589 ? [place] DSS Where to apply for food stamps, Medicaid and utility assistance. (636)136-8160 ? [facility]: Transportation to medical appointments. 601-724-0650 ? Social Security Administration: May apply for disability if have a Stage IV cancer. 319-394-9084 (409)388-2976 ? [employer], Disability and Transit Services: Assists with nutrition, care and transit needs. [facility] Support Programs:   > Cancer Support Group  2nd Tuesday of the month 1pm-2pm, Journey Room   > Creative Journey  3rd Tuesday of the month 1130am-1pm, Journey Room

## 2018-05-30 NOTE — Progress Notes (Signed)
Patient presented today for bone marrow biopsy. Consent obtained, time out done at 0836, started biopsy at [place], stopped at 0856. Vitals obtained pre and post procedure.    Patient tolerated it well without problems. Vitals stable and discharged home from clinic ambulatory. Follow up as scheduled.

## 2018-05-30 NOTE — Progress Notes (Signed)
INDICATION:    Bone Marrow Biopsy and Aspiration Procedure Note   The patient was identified by name and date of birth, prior to start of the procedure and a timeout was performed.   An informed consent was obtained after discussing potential risks including bleeding, infection and pain.  The left posterior iliac crest was palpated, cleaned with ChloraPrep, and drapes applied.  1% lidocaine is infiltrated into the skin, subcutaneous tissue and periosteum.  Bone marrow was aspirated and smears made.  With the help of Jamshidi needle a core biopsy was obtained.  Pressure was applied to the biopsy site and bandage was placed over the biopsy site. Patient was made to lie on the back for 15 mins prior to discharge.  The procedure was tolerated well. COMPLICATIONS: None BLOOD LOSS: none Patient was discharged home in stable condition to return in 2 weeks to review results.  Patient was provided with post bone marrow biopsy instructions and instructed to call if there was any bleeding or worsening pain.  Specimens sent for flow cytometry, cytogenetics and additional studies.  Signed Decorey Wahlert, MD   

## 2018-05-31 ENCOUNTER — Ambulatory Visit (HOSPITAL_COMMUNITY): Payer: Self-pay | Admitting: Licensed Clinical Social Worker

## 2018-06-04 NOTE — Procedures (Signed)
ELECTROENCEPHALOGRAM REPORT  Dates of Recording: 05/21/2018 11:37AM to 05/24/2018 11:43AM  Patient's Name: Charles Tyler MRN: [id number] Date of Birth: Nov 04, 1966  Referring Provider: Dr. Ellouise Newer  Procedure: 72-hour ambulatory EEG  History: This is a 52 year old man with recurrent episodes of metallic taste with deja vu, nocturnal shaking episodes. EEG for classification.  Medications: KEPPRA 750 MG tablet  DEPAKOTE ER 500 MG 24 hr tablet LIPITOR BENADRYL 25 mg capsule  PROZAC 20 MG capsule  NORCO/VICODIN 5-325 MG tablet methocarbamol MG tablet REMERON 30 MG tablet   Technical Summary: This is a 72-hour multichannel digital EEG recording measured by the international 10-20 system with electrodes applied with paste and impedances below 5000 ohms performed as portable with EKG monitoring.  The digital EEG was referentially recorded, reformatted, and digitally filtered in a variety of bipolar and referential montages for optimal display.    DESCRIPTION OF RECORDING: During maximal wakefulness, the background activity consisted of a symmetric 8.5 Hz posterior dominant rhythm which was reactive to eye opening.  There were no epileptiform discharges or focal slowing seen in wakefulness.  During the recording, the patient progresses through wakefulness, drowsiness, and Stage 2 sleep.  Again, there were no epileptiform discharges seen.  Events: Thre were no push button events.  There were no electrographic seizures seen.  EKG lead was unremarkable.  IMPRESSION: This 72-hour ambulatory EEG study is normal.    CLINICAL CORRELATION: A normal EEG does not exclude a clinical diagnosis of epilepsy. Typical events were not captured. If further clinical questions remain, inpatient video EEG monitoring may be helpful.   Ellouise Newer, M.D.

## 2018-06-08 ENCOUNTER — Encounter (HOSPITAL_COMMUNITY): Payer: Self-pay | Admitting: Hematology

## 2018-06-11 ENCOUNTER — Telehealth: Payer: Self-pay

## 2018-06-11 NOTE — Telephone Encounter (Signed)
-----   Message from Cameron Sprang, MD sent at 06/11/2018 11:51 AM EDT ----- Pls let him know the 72-hour EEG was normal. I have an opening tomorrow at 11:30am if he would like to come in and discuss results and next steps. Thanks

## 2018-06-11 NOTE — Telephone Encounter (Signed)
Called pt. No answer.  LMOM asking for return call to relay message below.  

## 2018-06-14 ENCOUNTER — Inpatient Hospital Stay (HOSPITAL_COMMUNITY): Payer: Medicaid Other | Attending: Hematology | Admitting: Hematology

## 2018-06-14 ENCOUNTER — Encounter (HOSPITAL_COMMUNITY): Payer: Self-pay | Admitting: Hematology

## 2018-06-14 ENCOUNTER — Other Ambulatory Visit: Payer: Self-pay

## 2018-06-14 VITALS — BP 113/73 | HR 72 | Temp 98.2°F | Resp 14 | Wt 187.4 lb

## 2018-06-14 DIAGNOSIS — Z87891 Personal history of nicotine dependence: Secondary | ICD-10-CM | POA: Insufficient documentation

## 2018-06-14 DIAGNOSIS — D696 Thrombocytopenia, unspecified: Secondary | ICD-10-CM | POA: Diagnosis not present

## 2018-06-14 DIAGNOSIS — G40909 Epilepsy, unspecified, not intractable, without status epilepticus: Secondary | ICD-10-CM | POA: Diagnosis not present

## 2018-06-14 DIAGNOSIS — D751 Secondary polycythemia: Secondary | ICD-10-CM | POA: Diagnosis not present

## 2018-06-14 NOTE — Progress Notes (Signed)
[address], [place] [address]   CLINIC:  Medical Oncology/Hematology  PCP:  Rosita Fire, [address] [place] [address] [phone]   REASON FOR VISIT:  Follow-up for erythrocytosis and thrombocytopenia   CURRENT THERAPY: work up coming back for results of bone marrow    INTERVAL HISTORY:  Charles Tyler 52 y.o. male returns for routine follow-up for erythrocytosis and thrombocytopenia. Patient is here today with his daughter. He has been doing well with no complaints at this time. Patient lives at home with his daugher and performs all his own ADLs and activities. Patient stopped smoking over a month ago and has been doing well with it. Patient denies any headaches or vision changes. Patient energy level and appetite are both at 50%. He is maintaining his weight and otherwise doing well.      REVIEW OF SYSTEMS:  Review of Systems  Constitutional: Positive for fatigue.  HENT:  Negative.   Eyes: Negative.   Respiratory: Negative.   Cardiovascular: Negative.   Gastrointestinal: Positive for constipation and diarrhea.  Endocrine: Negative.   Genitourinary: Negative.    Musculoskeletal: Negative.   Skin: Negative.   Neurological: Positive for dizziness.  Hematological: Bruises/bleeds easily.  Psychiatric/Behavioral: Positive for sleep disturbance.     PAST MEDICAL/SURGICAL HISTORY:  Past Medical History:  Diagnosis Date  . Coronary artery disease 08/2017   Stents placed in [place] in 2016. Nonobstructive lesions on 10/18 angiogram.  . Depression with anxiety   . History of cardiac catheterization    Done in [place] - details not clear  . Hyperlipidemia   . Migraine   . Seizures ([place])    Past Surgical History:  Procedure Laterality Date  . CHOLECYSTECTOMY    . LEFT HEART CATH AND CORONARY ANGIOGRAPHY N/A 08/23/2017   Procedure: LEFT HEART CATH AND CORONARY ANGIOGRAPHY;  Surgeon: Belva Crome, MD;   Location: [facility];  Service: Cardiovascular;  Laterality: N/A;     SOCIAL HISTORY:  Social History   Socioeconomic History  . Marital status: Significant Other    Spouse name: Not on file  . Number of children: 4  . Years of education: 2 years college  . Highest education level: Not on file  Occupational History  . Occupation: Unemployed  Social Needs  . Financial resource strain: Not on file  . Food insecurity:    Worry: Not on file    Inability: Not on file  . Transportation needs:    Medical: Not on file    Non-medical: Not on file  Tobacco Use  . Smoking status: Current Some Day Smoker    Packs/day: 1.00    Types: Cigarettes    Last attempt to quit: 01/05/2018    Years since quitting: 0.4  . Smokeless tobacco: Never Used  . Tobacco comment: off and on   Substance and Sexual Activity  . Alcohol use: Not Currently    Comment: rarely, 09-14-2017 per pt occas  . Drug use: No    Comment: 09-14-2017 per pt stopped 30 years ago - Cocaine, Marijuana and speed   . Sexual activity: Yes    Birth control/protection: None  Lifestyle  . Physical activity:    Days per week: Not on file    Minutes per session: Not on file  . Stress: Not on file  Relationships  . Social connections:    Talks on phone: Not on file    Gets together: Not on file  Attends religious service: Not on file    Active member of club or organization: Not on file    Attends meetings of clubs or organizations: Not on file    Relationship status: Not on file  . Intimate partner violence:    Fear of current or ex partner: Not on file    Emotionally abused: Not on file    Physically abused: Not on file    Forced sexual activity: Not on file  Other Topics Concern  . Not on file  Social History Narrative   Lives at home with his fiancee.   Right-handed.   2 cups caffeine per day.    FAMILY HISTORY:  Family History  Problem Relation Age of Onset  . CAD Father        Premature CAD status  post CABG  . Heart attack Father   . COPD Father   . Heart disease Father   . CAD Paternal Grandfather        Premature CAD status post CABG  . Heart disease Paternal Grandfather   . Alcohol abuse Maternal Grandfather   . Heart disease Maternal Grandfather   . Alcohol abuse Maternal Grandmother   . COPD Mother   . Hyperlipidemia Sister   . Asthma Sister   . Asthma Brother   . Allergies Sister   . Asthma Sister   . Multiple sclerosis Daughter   . Seizures Daughter     CURRENT MEDICATIONS:  Outpatient Encounter Medications as of 06/14/2018  Medication Sig Note  . atorvastatin (LIPITOR) 40 MG tablet Take 40 mg by mouth daily.   . diphenhydrAMINE (BENADRYL) 25 mg capsule Take 25 mg by mouth every 6 (six) hours as needed for itching.   . divalproex (DEPAKOTE ER) 500 MG 24 hr tablet Take 2 tablets daily   . FLUoxetine (PROZAC) 20 MG capsule Take 1 capsule (20 mg total) by mouth daily.   Marland Kitchen levETIRAcetam (KEPPRA) 750 MG tablet Take 2 tablets twice a day   . mirtazapine (REMERON) 30 MG tablet Take 1 tablet (30 mg total) by mouth at bedtime.   . [DISCONTINUED] methocarbamol (ROBAXIN) 500 MG tablet Take 1 or 2 po Q 6hrs for muscle pain (Patient taking differently: Take 500-1,000 mg by mouth every 6 (six) hours as needed for muscle spasms. ) 02/14/2018: Has not yet filled   No facility-administered encounter medications on file as of 06/14/2018.     ALLERGIES:  Allergies  Allergen Reactions  . Aspirin Nausea And Vomiting    325 mg   . Other Hives    Walnuts     PHYSICAL EXAM:  ECOG Performance status: 1  Vitals:   06/14/18 1438  BP: 113/73  Pulse: 72  Resp: 14  Temp: 98.2 F (36.8 C)  SpO2: 97%   Filed Weights   06/14/18 1438  Weight: 187 lb 6.4 oz (85 kg)    Physical Exam Deferred.  LABORATORY DATA:  I have reviewed the labs as listed.  CBC    Component Value Date/Time   WBC 8.6 05/30/2018 0814   RBC 5.27 05/30/2018 0814   HGB 17.4 (H) 05/30/2018 0814   HCT  48.0 05/30/2018 0814   PLT 159 05/30/2018 0814   MCV 91.1 05/30/2018 0814   MCH 33.0 05/30/2018 0814   MCHC 36.3 (H) 05/30/2018 0814   RDW 12.9 05/30/2018 0814   LYMPHSABS 1.7 05/30/2018 0814   MONOABS 0.7 05/30/2018 0814   EOSABS 1.0 (H) 05/30/2018 0814   BASOSABS 0.0 05/30/2018 5188  CMP Latest Ref Rng & Units 03/01/2018 02/16/2018 02/15/2018  Glucose 65 - 99 mg/dL 86 100(H) 117(H)  BUN 6 - 20 mg/dL '12 8 8  '$ Creatinine 0.61 - 1.24 mg/dL 0.90 0.75 0.91  Sodium 135 - 145 mmol/L 139 140 139  Potassium 3.5 - 5.1 mmol/L 4.0 3.8 3.4(L)  Chloride 101 - 111 mmol/L 105 110 109  CO2 22 - 32 mmol/L '26 23 23  '$ Calcium 8.9 - 10.3 mg/dL 8.3(L) 8.4(L) 8.4(L)  Total Protein 6.5 - 8.1 g/dL 5.7(L) - 6.2(L)  Total Bilirubin 0.3 - 1.2 mg/dL 0.5 - 0.3  Alkaline Phos 38 - 126 U/L 46 - 52  AST 15 - 41 U/L 17 - 26  ALT 17 - 63 U/L 22 - 33       ASSESSMENT & PLAN:   Erythrocytosis 1.  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 Josephine Cables office showed hemoglobin of 19.2, hematocrit of 54.3 with elevated red cell count of 5.88.  White count and platelet count were within normal limits.  Differential showed 10.7% eosinophils with increased absolute eosinophil count. - On review of epic system for previous labs, his hemoglobin was found to be elevated in October 2018 in March 2019.  It has been normal other times. -Patient was diagnosed with seizure disorder and was started on Keppra and Depakote. -I have reviewed a chest x-ray from October 2018 which was showing no evidence of cardiopulmonary disease. -He does not have any fevers, night sweats or weight loss.  Does not have any history of thrombosis.  No symptoms of erythromelalgias.  He has itching on his face and neck which lasts about 45 minutes after taking hot showers for the last 2 months. - He was ex-smoker who smoked 2 pack/day for 20 years and quit 11 years ago.  He started back smoking 2 months ago as his girlfriend also smokes but  both of them quit for the last 4 weeks. - I have repeated labs.  His hemoglobin is elevated at 18.1.  LDH was within normal limits.  Jak 2 mutation testing was negative.  However erythropoietin was low at 3.5.  Urinalysis did not show any hematuria. - He was also negative for other MPN mutations including CALR, Jak exon 12-15.  Erythropoietin being low was suspicious for polycythemia.  He also has intermittent mild thrombocytopenia over many years. - He had undergone bone marrow biopsy on 05/30/2018 which showed trilineage hematopoiesis.  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  His hemoglobin also improved to 17.3 on the day of bone marrow biopsy.  This has improved since he quit smoking few months ago.  I had extensive discussion with him about the results of the bone marrow biopsy.  Have suggested him to continue to stay away from smoking. -We will see him back in 4 months with repeat hemoglobin.  If it comes down to normal, he can be followed up with his PMD.      Orders placed this encounter:  Orders Placed This Encounter  Procedures  . Lactate dehydrogenase  . CBC with Differential/Platelet  . Comprehensive metabolic panel      Derek Jack, MD [place] [phone]

## 2018-06-14 NOTE — Patient Instructions (Signed)
[facility] at [facility] Discharge Instructions Follow up in 4 months with labs    Thank you for choosing [facility] at [facility] to provide your oncology and hematology care.  To afford each patient quality time with our provider, please arrive at least 15 minutes before your scheduled appointment time.   If you have a lab appointment with the Cancer Center please come in thru the  Main Entrance and check in at the main information desk  You need to re-schedule your appointment should you arrive 10 or more minutes late.  We strive to give you quality time with our providers, and arriving late affects you and other patients whose appointments are after yours.  Also, if you no show three or more times for appointments you may be dismissed from the clinic at the providers discretion.     Again, thank you for choosing [facility].  Our hope is that these requests will decrease the amount of time that you wait before being seen by our physicians.       _____________________________________________________________  Should you have questions after your visit to [facility], please contact our office at [phone] between the hours of 8:00 a.m. and 4:30 p.m.  Voicemails left after 4:00 p.m. will not be returned until the following business day.  For prescription refill requests, have your pharmacy contact our office and allow 72 hours.    Cancer Center Support Programs:   > Cancer Support Group  2nd Tuesday of the month 1pm-2pm, Journey Room    

## 2018-06-14 NOTE — Assessment & Plan Note (Addendum)
1.  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 Josephine Cables office showed hemoglobin of 19.2, hematocrit of 54.3 with elevated red cell count of 5.88.  White count and platelet count were within normal limits.  Differential showed 10.7% eosinophils with increased absolute eosinophil count. - On review of epic system for previous labs, his hemoglobin was found to be elevated in October 2018 in March 2019.  It has been normal other times. -Patient was diagnosed with seizure disorder and was started on Keppra and Depakote. -I have reviewed a chest x-ray from October 2018 which was showing no evidence of cardiopulmonary disease. -He does not have any fevers, night sweats or weight loss.  Does not have any history of thrombosis.  No symptoms of erythromelalgias.  He has itching on his face and neck which lasts about 45 minutes after taking hot showers for the last 2 months. - He was ex-smoker who smoked 2 pack/day for 20 years and quit 11 years ago.  He started back smoking 2 months ago as his girlfriend also smokes but both of them quit for the last 4 weeks. - I have repeated labs.  His hemoglobin is elevated at 18.1.  LDH was within normal limits.  Jak 2 mutation testing was negative.  However erythropoietin was low at 3.5.  Urinalysis did not show any hematuria. - He was also negative for other MPN mutations including CALR, Jak exon 12-15.  Erythropoietin being low was suspicious for polycythemia.  He also has intermittent mild thrombocytopenia over many years. - He had undergone bone marrow biopsy on 05/30/2018 which showed trilineage hematopoiesis.  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  His hemoglobin also improved to 17.3 on the day of bone marrow biopsy.  This has improved since he quit smoking few months ago.  I had extensive discussion with him about the results of the bone marrow biopsy.  Have suggested him to continue to stay away from smoking. -We will see him back in 4 months with repeat  hemoglobin.  If it comes down to normal, he can be followed up with his PMD.

## 2018-06-28 ENCOUNTER — Encounter (HOSPITAL_COMMUNITY): Payer: Self-pay | Admitting: Licensed Clinical Social Worker

## 2018-06-28 ENCOUNTER — Ambulatory Visit (HOSPITAL_COMMUNITY): Payer: Medicaid Other | Admitting: Licensed Clinical Social Worker

## 2018-06-28 DIAGNOSIS — F331 Major depressive disorder, recurrent, moderate: Secondary | ICD-10-CM

## 2018-06-28 NOTE — Progress Notes (Signed)
   THERAPIST PROGRESS NOTE  Session Time: 9:00 am-9:45 am  Participation Level: Active  Behavioral Response: CasualAlertAnxious and Depressed  Type of Therapy: Individual Therapy  Treatment Goals addressed: Coping  Interventions: CBT and Solution Focused  Summary: Charles Tyler is a 52 y.o. male who presents oriented x5 (person, place, situation, time and object), alert, anxious, casually dressed, appropriately groomed, average height, average weight, and cooperative to address mood and anxiety. Patient has a history of medical history including seizures. Patient has no history of mental health treatment. Patient denies symptoms of mania. Patient denies suicidal and homicidal ideations. Patient denies psychosis including auditory and visual hallucinations. Patient denies current substance abuse. Patient is at low risk for lethality.   Physical: Patient has back pain due to a herniated disc. He had a cancer scare due to issues related to his blood. He has to go back in 4 months and get a check up.  Spiritual/Values: No issue identified.  Relationships: Patient's relationship with his girlfriend is strained. She is talking to her ex boyfriends, being secretive about it and talked about meeting up with one for lunch. Patient doesn't know where he stands with her. He feels she is blaming him for the current pregnancy and throws it in his face that he is not currently working. Patient is also dealing with the stress of his divorce and dealing with his wife. She continues to threaten to end her life after their divorce is final. Patient has reported her threats.   Emotional/Mental/Behavior: Patient is stressed about his relationship and his health. He is trying to separate his girlfriend's symptoms of depression and her behaviors (talking to ex's). He plans to go to the [facility] to start exercising when school is back in to take care of his physical health and his mental health.   Patient engaged in  session. He responded well to interventions. Patient continues to met criteria for Major depressive disorder, recurrent episode, moderate with anxious distress. Patient will continue in outpatient therapy due to being the least restrictive service to meet his needs at this time. Patient made minimal progress on his goals.  Suicidal/Homicidal: Negativewithout intent/plan  Therapist Response: Therapist reviewed patient's recent thoughts and behaviors. Therapist utilized CBT to address mood. Therapist processed patient's feelings to identify triggers for mood. Therapist discussed patient's relationships, finding balance in his view of his girlfriends behaviors and taking care of his physical health.    Plan: Return again in 3-4 weeks.  Diagnosis: Axis I: Major depressive disorder, recurrent episode, moderate with anxious distress    Axis II: No diagnosis    Glori Bickers, LCSW 06/28/2018

## 2018-07-03 ENCOUNTER — Other Ambulatory Visit: Payer: Self-pay

## 2018-07-03 ENCOUNTER — Ambulatory Visit: Payer: Medicaid Other | Admitting: Neurology

## 2018-07-03 ENCOUNTER — Encounter: Payer: Self-pay | Admitting: Neurology

## 2018-07-03 VITALS — BP 124/76 | HR 74 | Ht 67.0 in | Wt 190.0 lb

## 2018-07-03 DIAGNOSIS — G40009 Localization-related (focal) (partial) idiopathic epilepsy and epileptic syndromes with seizures of localized onset, not intractable, without status epilepticus: Secondary | ICD-10-CM | POA: Diagnosis not present

## 2018-07-03 MED ORDER — LEVETIRACETAM 750 MG PO TABS: ORAL_TABLET | ORAL | 11 refills | Status: DC

## 2018-07-03 MED ORDER — DIVALPROEX SODIUM ER 500 MG PO TB24: ORAL_TABLET | ORAL | 11 refills | Status: DC

## 2018-07-03 NOTE — Patient Instructions (Signed)
1. Continue Keppra 750mg : take 2 tablets twice a day and Depakote 500mg : take 2 tablets daily 2. If seizures continue on the 2 medications, we will plan for referral to [facility] for inpatient video EEG monitoring 3. Follow-up in 6 months, call for any changes  Seizure Precautions: 1. If medication has been prescribed for you to prevent seizures, take it exactly as directed.  Do not stop taking the medicine without talking to your doctor first, even if you have not had a seizure in a long time.   2. Avoid activities in which a seizure would cause danger to yourself or to others.  Don't operate dangerous machinery, swim alone, or climb in high or dangerous places, such as on ladders, roofs, or girders.  Do not drive unless your doctor says you may.  3. If you have any warning that you may have a seizure, lay down in a safe place where you can't hurt yourself.    4.  No driving for 6 months from last seizure, as per [facility]   Please refer to the following link on the [place] website for more information: http://www.epilepsyfoundation.org/answerplace/Social/driving/drivingu.cfm   5.  Maintain good sleep hygiene. Avoid alcohol.  6.  Contact your doctor if you have any problems that may be related to the medicine you are taking.  7.  Call 911 and bring the patient back to the ED if:        A.  The seizure lasts longer than 5 minutes.       B.  The patient doesn't awaken shortly after the seizure  C.  The patient has new problems such as difficulty seeing, speaking or moving  D.  The patient was injured during the seizure  E.  The patient has a temperature over 102 F (39C)  F.  The patient vomited and now is having trouble breathing

## 2018-07-03 NOTE — Progress Notes (Signed)
NEUROLOGY FOLLOW UP OFFICE NOTE  Charles Tyler [id number] 1965/11/09  HISTORY OF PRESENT ILLNESS: I had the pleasure of seeing Charles Tyler in follow-up in the neurology clinic on 07/03/2018.  The patient was last seen 6 weeks ago for seizures. He is alone in the office today. Records and images were personally reviewed where available.  He continued to report episodes of deja vu and metallic taste 3-4 times a week and had a 72-hour EEG which was normal, however typical events were not captured. He states he has not had the metallic taste in a while. He denies any further episodes of loss of consciousness since 03/01/18. He denies any staring/unresponsive episodes, gaps in time. He denies any side effects on Keppra 1500mg  BID and Depakote ER 1000mg  daily. He denies any headache, dizziness, vision changes, falls. He has noticed sometimes his left eyelid lags when blinking. Mood is okay, he sees a Social worker and a psychiatrist, on Prozac which helps. He has poor sleep, usually around 3-4 hours of sleep, tossing and turning. His girlfriend tells him he twitches a lot at night. Melatonin helped with sleep but caused him to feel hungover the next morning. His psychiatrist prescribed a sleep medication which initially helped but then stopped working.   History on Initial Assesment 05/11/2018: This is a 52 year old right-handed man with a history of hyperlipidemia, CAD s/p stent placement, depression, anxiety,  recurrent syncope, presenting for evaluation of seizures. He recalls recurrent syncopal episodes since his mid-20s. No prior warning, he would feel weak, tired, and lost after. He recalls talking to his daughter on the phone 13 years ago, she heard a fall and called EMS to his home, they found him on the floor. He reports having a cardiac workup at that time. He was evaluated by neurologist Dr. [name] in December 2018 after another episode in November 2018 where he was bathing his fiancee's child and  he was found on the floor unconscious. MRI brain unremarkable. He had overnight video EEG monitoring at that time which was normal, typical events not captured. He was discharged on Keppra. There seemed to be some improvement in seizure activity. His fiancee reported shaking episodes every night, more on the right side, body stiffness, waking up confused. Depakote was added. He lost his insurance and was off medication for 2-3 months, leading to 3 ER visits in April 2019. He had an eyebrow laceration that was repaired. He was admitted on 02/15/18. Per records he was witnessed to have a brief period of shaking, interactive, following commands during the episode. Routine EEG was normal. He was discharged on Keppra 1500mg  BID and Depakote 1000mg  daily. He was back in the ER on 03/01/18 for another seizure. Depakote level was 47. No medication changes made.   He reports having stages of staring off for a few minutes and feeling lost. This has not occurred in a while. He also has recurrent deja vu episodes occurring 3-4 times a week, he describes this as doing something but feeling like he did it twice. Last episode was a couple of days ago. This is associated with spinning and a metallic taste, like chewing on copper. Most of the time he sits down, they can last from 10-15 minutes up to a few hours. He denies any recent shaking episodes that his fiancee was reporting, but they have not been sleeping in the same bed due to snoring and kicking/punching in sleep since sleep medication was started. He feels shaking inside his body. His  fiancee has not mentioned any staring spells. He denies any epigastric sensation, focal numbness/tingling/weakness, myoclonic jerks. He has migraines that are seasonal, with frontal throbbing pain associated with light/sound sensitivity, no nausea/vomiting. Ibuprofen and sleep helps. No dysphagia, dysarthria, neck pain, bowel/bladder dysfunction. He has low back pain and reports his bones  hurt with pain in both legs.   Epilepsy Risk Factors:  He had a normal birth and early development.  There is no history of febrile convulsions, CNS infections such as meningitis/encephalitis, significant traumatic brain injury, neurosurgical procedures, or family history of seizures.  PAST MEDICAL HISTORY: Past Medical History:  Diagnosis Date  . Coronary artery disease 08/2017   Stents placed in [place] in 2016. Nonobstructive lesions on 10/18 angiogram.  . Depression with anxiety   . History of cardiac catheterization    Done in [place] - details not clear  . Hyperlipidemia   . Migraine   . Seizures ([place])     MEDICATIONS: Current Outpatient Medications on File Prior to Visit  Medication Sig Dispense Refill  . atorvastatin (LIPITOR) 40 MG tablet Take 40 mg by mouth daily.  3  . diphenhydrAMINE (BENADRYL) 25 mg capsule Take 25 mg by mouth every 6 (six) hours as needed for itching.    . divalproex (DEPAKOTE ER) 500 MG 24 hr tablet Take 2 tablets daily 60 tablet 11  . FLUoxetine (PROZAC) 20 MG capsule Take 1 capsule (20 mg total) by mouth daily. 90 capsule 0  . levETIRAcetam (KEPPRA) 750 MG tablet Take 2 tablets twice a day 120 tablet 11  . mirtazapine (REMERON) 30 MG tablet Take 1 tablet (30 mg total) by mouth at bedtime. 90 tablet 0   No current facility-administered medications on file prior to visit.     ALLERGIES: Allergies  Allergen Reactions  . Aspirin Nausea And Vomiting    325 mg   . Other Hives    Walnuts    FAMILY HISTORY: Family History  Problem Relation Age of Onset  . CAD Father        Premature CAD status post CABG  . Heart attack Father   . COPD Father   . Heart disease Father   . CAD Paternal Grandfather        Premature CAD status post CABG  . Heart disease Paternal Grandfather   . Alcohol abuse Maternal Grandfather   . Heart disease Maternal Grandfather   . Alcohol abuse Maternal Grandmother   . COPD Mother   . Hyperlipidemia Sister   .  Asthma Sister   . Asthma Brother   . Allergies Sister   . Asthma Sister   . Multiple sclerosis Daughter   . Seizures Daughter     SOCIAL HISTORY: Social History   Socioeconomic History  . Marital status: Significant Other    Spouse name: Not on file  . Number of children: 4  . Years of education: 2 years college  . Highest education level: Not on file  Occupational History  . Occupation: Unemployed  Social Needs  . Financial resource strain: Not on file  . Food insecurity:    Worry: Not on file    Inability: Not on file  . Transportation needs:    Medical: Not on file    Non-medical: Not on file  Tobacco Use  . Smoking status: Current Some Day Smoker    Packs/day: 1.00    Types: Cigarettes    Last attempt to quit: 01/05/2018    Years since quitting: 0.4  . Smokeless  tobacco: Never Used  . Tobacco comment: off and on   Substance and Sexual Activity  . Alcohol use: Not Currently    Comment: rarely, 09-14-2017 per pt occas  . Drug use: No    Comment: 09-14-2017 per pt stopped 30 years ago - Cocaine, Marijuana and speed   . Sexual activity: Yes    Birth control/protection: None  Lifestyle  . Physical activity:    Days per week: Not on file    Minutes per session: Not on file  . Stress: Not on file  Relationships  . Social connections:    Talks on phone: Not on file    Gets together: Not on file    Attends religious service: Not on file    Active member of club or organization: Not on file    Attends meetings of clubs or organizations: Not on file    Relationship status: Not on file  . Intimate partner violence:    Fear of current or ex partner: Not on file    Emotionally abused: Not on file    Physically abused: Not on file    Forced sexual activity: Not on file  Other Topics Concern  . Not on file  Social History Narrative   Lives at home with his fiancee.   Right-handed.   2 cups caffeine per day.    REVIEW OF SYSTEMS: Constitutional: No fevers, chills,  or sweats, no generalized fatigue, change in appetite Eyes: No visual changes, double vision, eye pain Ear, nose and throat: No hearing loss, ear pain, nasal congestion, sore throat Cardiovascular: No chest pain, palpitations Respiratory:  No shortness of breath at rest or with exertion, wheezes GastrointestinaI: No nausea, vomiting, diarrhea, abdominal pain, fecal incontinence Genitourinary:  No dysuria, urinary retention or frequency Musculoskeletal:  No neck pain, back pain Integumentary: No rash, pruritus, skin lesions Neurological: as above Psychiatric: No depression, insomnia, anxiety Endocrine: No palpitations, fatigue, diaphoresis, mood swings, change in appetite, change in weight, increased thirst Hematologic/Lymphatic:  No anemia, purpura, petechiae. Allergic/Immunologic: no itchy/runny eyes, nasal congestion, recent allergic reactions, rashes  PHYSICAL EXAM: Vitals:   07/03/18 1320  BP: 124/76  Pulse: 74  SpO2: 95%   General: No acute distress Head:  Normocephalic/atraumatic Neck: supple, no paraspinal tenderness, full range of motion Heart:  Regular rate and rhythm Lungs:  Clear to auscultation bilaterally Back: No paraspinal tenderness Skin/Extremities: No rash, no edema Neurological Exam: alert and oriented to person, place, and time. No aphasia or dysarthria. Fund of knowledge is appropriate.  Recent and remote memory are intact.  Attention and concentration are normal.    Able to name objects and repeat phrases. Cranial nerves: Pupils equal, round, reactive to light.  Extraocular movements intact with no nystagmus. Visual fields full. Facial sensation intact. No facial asymmetry. Tongue, uvula, palate midline.  Motor: Bulk and tone normal, muscle strength 5/5 throughout with no pronator drift.  Sensation to light touch intact.  No extinction to double simultaneous stimulation.  Finger to nose testing intact.  Gait narrow-based and steady, able to tandem walk adequately.   Romberg negative.  IMPRESSION: This is a pleasant 52 yo RH man with a history of CAD s/p stent placement, depression, anxiety,  recurrent syncope, who presented for evaluation of seizures. He describes recurrent episodes of metallic taste with deja vu. Concern has been raised about non-epileptic seizures in the ER, however some of the symptoms he describes are suggestive of focal seizures arising from the temporal lobe. MRI brain and 72-hour  EEG normal, however typical events were not captured. He denies any seizures since April 2019, continue Depakote ER 1000mg  daily and Keppra 1500mg  BID. We discussed that if seizures continue on 2 AEDs, we will refer him to [facility] for inpatient video EEG monitoring for seizure classification. Continue follow-up with Behavioral Health. He is aware of [place] driving laws to stop driving after a seizure until 6 months seizure-free. He will follow-up in 6 months and knows to call for any changes  Thank you for allowing me to participate in his care.  Please do not hesitate to call for any questions or concerns.  The duration of this appointment visit was 20 minutes of face-to-face time with the patient.  Greater than 50% of this time was spent in counseling, explanation of diagnosis, planning of further management, and coordination of care.   Ellouise Newer, M.D.   CC: Dr. Legrand Rams

## 2018-07-12 ENCOUNTER — Ambulatory Visit (HOSPITAL_COMMUNITY): Payer: Self-pay | Admitting: Licensed Clinical Social Worker

## 2018-07-12 NOTE — Progress Notes (Signed)
BH MD/PA/NP OP Progress Note  07/18/2018 9:48 AM Reedy Biernat  MRN:  [id number]  Chief Complaint:  Chief Complaint    Trauma; Follow-up; Depression     HPI:  Patient presents for follow-up appointment for depression and PTSD.  He states that he has been arguing with his girlfriend.  His girlfriend is playing with the phone all day long.  He has been taking care of her children and reports good relationship.  Her girlfriend would accused him that he tries to isolate his girlfriend, although he denies any intention.  He has been stressed and has been having "silent seizures" of dozing off a few times per week.  He also feels irritable at times.  He has passive SI when he felt frustrated about his situation; he does not think the situation is going as he had imagined.  He wishes that his children (with his ex-wife) will talk with him.  He has middle insomnia.  He feels depressed and fatigued.  He has fair concentration.  He feels anxious and tense at times.  He denies panic attacks.  He has fair appetite; he is concerned about weight gain.  He denies nightmares, flashback or hypervigilance.    Wt Readings from Last 3 Encounters:  07/18/18 199 lb (90.3 kg)  07/03/18 190 lb (86.2 kg)  06/14/18 187 lb 6.4 oz (85 kg)  weight 160 lb 6.4 oz   09/2017  Visit Diagnosis:    ICD-10-CM   1. PTSD (post-traumatic stress disorder) F43.10   2. MDD (major depressive disorder), recurrent episode, moderate ([place]) F33.1     Past Psychiatric History: Please see initial evaluation for full details. I have reviewed the history. No updates at this time.     Past Medical History:  Past Medical History:  Diagnosis Date  . Coronary artery disease 08/2017   Stents placed in [place] in 2016. Nonobstructive lesions on 10/18 angiogram.  . Depression with anxiety   . History of cardiac catheterization    Done in [place] - details not clear  . Hyperlipidemia   . Migraine   . Seizures ([place])     Past  Surgical History:  Procedure Laterality Date  . CHOLECYSTECTOMY    . LEFT HEART CATH AND CORONARY ANGIOGRAPHY N/A 08/23/2017   Procedure: LEFT HEART CATH AND CORONARY ANGIOGRAPHY;  Surgeon: Belva Crome, MD;  Location: [facility];  Service: Cardiovascular;  Laterality: N/A;    Family Psychiatric History: Please see initial evaluation for full details. I have reviewed the history. No updates at this time.     Family History:  Family History  Problem Relation Age of Onset  . CAD Father        Premature CAD status post CABG  . Heart attack Father   . COPD Father   . Heart disease Father   . CAD Paternal Grandfather        Premature CAD status post CABG  . Heart disease Paternal Grandfather   . Alcohol abuse Maternal Grandfather   . Heart disease Maternal Grandfather   . Alcohol abuse Maternal Grandmother   . COPD Mother   . Hyperlipidemia Sister   . Asthma Sister   . Asthma Brother   . Allergies Sister   . Asthma Sister   . Multiple sclerosis Daughter   . Seizures Daughter     Social History:  Social History   Socioeconomic History  . Marital status: Significant Other    Spouse name: Not on file  .  Number of children: 4  . Years of education: 2 years college  . Highest education level: Not on file  Occupational History  . Occupation: Unemployed  Social Needs  . Financial resource strain: Not on file  . Food insecurity:    Worry: Not on file    Inability: Not on file  . Transportation needs:    Medical: Not on file    Non-medical: Not on file  Tobacco Use  . Smoking status: Current Some Day Smoker    Packs/day: 1.00    Types: Cigarettes    Last attempt to quit: 01/05/2018    Years since quitting: 0.5  . Smokeless tobacco: Never Used  . Tobacco comment: off and on   Substance and Sexual Activity  . Alcohol use: Not Currently    Comment: rarely, 09-14-2017 per pt occas  . Drug use: No    Comment: 09-14-2017 per pt stopped 30 years ago - Cocaine,  Marijuana and speed   . Sexual activity: Yes    Birth control/protection: None  Lifestyle  . Physical activity:    Days per week: Not on file    Minutes per session: Not on file  . Stress: Not on file  Relationships  . Social connections:    Talks on phone: Not on file    Gets together: Not on file    Attends religious service: Not on file    Active member of club or organization: Not on file    Attends meetings of clubs or organizations: Not on file    Relationship status: Not on file  Other Topics Concern  . Not on file  Social History Narrative   Lives at home with his fiancee.   Right-handed.   2 cups caffeine per day.    Allergies:  Allergies  Allergen Reactions  . Aspirin Nausea And Vomiting    325 mg   . Other Hives    Walnuts    Metabolic Disorder Labs: No results found for: HGBA1C, MPG Lab Results  Component Value Date   PROLACTIN 10.4 02/15/2018   Lab Results  Component Value Date   CHOL 182 08/24/2017   TRIG 94 08/24/2017   HDL 30 (L) 08/24/2017   CHOLHDL 6.1 08/24/2017   VLDL 19 08/24/2017   LDLCALC 133 (H) 08/24/2017   Lab Results  Component Value Date   TSH 2.497 02/16/2018   TSH 1.940 09/10/2017    Therapeutic Level Labs: No results found for: LITHIUM Lab Results  Component Value Date   VALPROATE 47 (L) 03/01/2018   VALPROATE <10 (L) 01/24/2018   No components found for:  CBMZ  Current Medications: Current Outpatient Medications  Medication Sig Dispense Refill  . atorvastatin (LIPITOR) 40 MG tablet Take 40 mg by mouth daily.  3  . diphenhydrAMINE (BENADRYL) 25 mg capsule Take 25 mg by mouth every 6 (six) hours as needed for itching.    . divalproex (DEPAKOTE ER) 500 MG 24 hr tablet Take 2 tablets daily 60 tablet 11  . FLUoxetine (PROZAC) 40 MG capsule Take 1 capsule (40 mg total) by mouth daily. 90 capsule 0  . levETIRAcetam (KEPPRA) 750 MG tablet Take 2 tablets twice a day 120 tablet 11  . mirtazapine (REMERON) 15 MG tablet Take 2  tablets (30 mg total) by mouth at bedtime. 90 tablet 0   No current facility-administered medications for this visit.      Musculoskeletal: Strength & Muscle Tone: within normal limits Gait & Station: normal Patient leans: N/A  Psychiatric Specialty Exam: Review of Systems  Psychiatric/Behavioral: Positive for depression. Negative for hallucinations, memory loss, substance abuse and suicidal ideas. The patient is nervous/anxious and has insomnia.   All other systems reviewed and are negative.   Blood pressure 121/77, pulse 75, height 5\' 7"  (1.702 m), weight 199 lb (90.3 kg), SpO2 96 %.Body mass index is 31.17 kg/m.  General Appearance: Fairly Groomed  Eye Contact:  Good  Speech:  Clear and Coherent  Volume:  Normal  Mood:  Depressed  Affect:  Appropriate, Congruent and slightly down  Thought Process:  Coherent  Orientation:  Full (Time, Place, and Person)  Thought Content: Logical   Suicidal Thoughts:  No  Homicidal Thoughts:  No  Memory:  Immediate;   Good  Judgement:  Good  Insight:  Fair  Psychomotor Activity:  Normal  Concentration:  Concentration: Good and Attention Span: Good  Recall:  Good  Fund of Knowledge: Good  Language: Good  Akathisia:  No  Handed:  Right  AIMS (if indicated): not done  Assets:  Communication Skills Desire for Improvement  ADL's:  Intact  Cognition: WNL  Sleep:  Poor   Screenings:   Assessment and Plan:  Arhaan Chesnut is a 53 y.o. 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  However, there has been significant weight gain since the initial appointment.  Will taper down mirtazapine to minimize potential weight gain.  Will uptitrate fluoxetine to target PTSD and depression.  Discussed behavioral activation.  He is encouraged to continue to see a therapist.   Plan I have reviewed and updated plans as below 1. Increase Fluoxetine 40 mg daily  2. Decrease mirtazapine 15 mg at night  3. Return to clinic in three months for 15 mins  The patient demonstrates the following risk factors for suicide: Chronic risk factors for suicide include:psychiatric disorder ofdepression, PTSDand substance use disorder. Acute risk factorsfor suicide include: family or marital conflict. Protective factorsfor this patient include: responsibility to others (children, family), coping skills and hope for the future. Considering these factors, the overall suicide risk at this point appears to below. Patientisappropriate for outpatient follow up.  Norman Clay, MD 07/18/2018, 9:48 AM

## 2018-07-17 ENCOUNTER — Ambulatory Visit (HOSPITAL_COMMUNITY): Payer: Self-pay | Admitting: Psychiatry

## 2018-07-18 ENCOUNTER — Encounter (HOSPITAL_COMMUNITY): Payer: Self-pay | Admitting: Psychiatry

## 2018-07-18 ENCOUNTER — Ambulatory Visit (INDEPENDENT_AMBULATORY_CARE_PROVIDER_SITE_OTHER): Payer: Medicaid Other | Admitting: Psychiatry

## 2018-07-18 VITALS — BP 121/77 | HR 75 | Ht 67.0 in | Wt 199.0 lb

## 2018-07-18 DIAGNOSIS — F4310 Post-traumatic stress disorder, unspecified: Secondary | ICD-10-CM

## 2018-07-18 DIAGNOSIS — F331 Major depressive disorder, recurrent, moderate: Secondary | ICD-10-CM | POA: Diagnosis not present

## 2018-07-18 DIAGNOSIS — F17210 Nicotine dependence, cigarettes, uncomplicated: Secondary | ICD-10-CM

## 2018-07-18 DIAGNOSIS — Z811 Family history of alcohol abuse and dependence: Secondary | ICD-10-CM

## 2018-07-18 DIAGNOSIS — G4700 Insomnia, unspecified: Secondary | ICD-10-CM

## 2018-07-18 DIAGNOSIS — F419 Anxiety disorder, unspecified: Secondary | ICD-10-CM | POA: Diagnosis not present

## 2018-07-18 DIAGNOSIS — Z79899 Other long term (current) drug therapy: Secondary | ICD-10-CM

## 2018-07-18 MED ORDER — MIRTAZAPINE 15 MG PO TABS: 30.0000 mg | ORAL_TABLET | Freq: Every day | ORAL | 0 refills | Status: DC

## 2018-07-18 MED ORDER — FLUOXETINE HCL 40 MG PO CAPS: 40.0000 mg | ORAL_CAPSULE | Freq: Every day | ORAL | 0 refills | Status: DC

## 2018-07-18 NOTE — Patient Instructions (Signed)
1. Increase Fluoxetine 40 mg daily  2. Decrease mirtazapine 15 mg at night  3. Return to clinic in three months for 15 mins

## 2018-07-23 ENCOUNTER — Ambulatory Visit (HOSPITAL_COMMUNITY)
Admission: RE | Admit: 2018-07-23 | Discharge: 2018-07-23 | Disposition: A | Discharge status: Home / Self Care | Payer: Medicaid Other | Source: Ambulatory Visit | Attending: Internal Medicine | Admitting: Internal Medicine

## 2018-07-23 ENCOUNTER — Other Ambulatory Visit (HOSPITAL_COMMUNITY): Payer: Self-pay | Admitting: Internal Medicine

## 2018-07-23 DIAGNOSIS — M25562 Pain in left knee: Secondary | ICD-10-CM | POA: Insufficient documentation

## 2018-07-24 MED ORDER — DIVALPROEX SODIUM ER 500 MG PO TB24: ORAL_TABLET | ORAL | 11 refills | Status: DC

## 2018-07-31 ENCOUNTER — Encounter (HOSPITAL_COMMUNITY): Payer: Self-pay | Admitting: Licensed Clinical Social Worker

## 2018-07-31 ENCOUNTER — Ambulatory Visit (INDEPENDENT_AMBULATORY_CARE_PROVIDER_SITE_OTHER): Payer: Medicaid Other | Admitting: Licensed Clinical Social Worker

## 2018-07-31 DIAGNOSIS — F331 Major depressive disorder, recurrent, moderate: Secondary | ICD-10-CM | POA: Diagnosis not present

## 2018-07-31 NOTE — Progress Notes (Signed)
   THERAPIST PROGRESS NOTE  Session Time: 8:00 am-8:45 am  Participation Level: Active  Behavioral Response: CasualAlertAnxious and Depressed  Type of Therapy: Individual Therapy  Treatment Goals addressed: Coping  Interventions: CBT and Solution Focused  Summary: Charles Tyler is a 52 y.o. male who presents oriented x5 (person, place, situation, time and object), alert, anxious, casually dressed, appropriately groomed, average height, average weight, and cooperative to address mood and anxiety. Patient has a history of medical history including seizures. Patient has no history of mental health treatment. Patient denies symptoms of mania. Patient denies suicidal and homicidal ideations. Patient denies psychosis including auditory and visual hallucinations. Patient denies current substance abuse. Patient is at low risk for lethality.   Physical: Patient has gained weight and has been sleeping more. He thinks this is due to medications. Patient is also experiencing knee pain. He is staying active.  Spiritual/Values: No issue identified.  Relationships: Patient's relationship with his girlfriend is the same.She spends most of the day in bed and doesn't interact with patient or her kids very much. Patient is maintaining the home by getting the kids ready for school, cooking dinner, helping with homework, etc.    Emotional/Mental/Behavior: Patient's mood has been stable. She has experienced some anxiety due to his girlfriend being on Facebook all the time which causes him to have trust issues and his upcoming divorce. Patient is trying to take things one step at a time.   Patient engaged in session. He responded well to interventions. Patient continues to met criteria for Major depressive disorder, recurrent episode, moderate with anxious distress. Patient will continue in outpatient therapy due to being the least restrictive service to meet his needs at this time. Patient made minimal progress  on his goals.  Suicidal/Homicidal: Negativewithout intent/plan  Therapist Response: Therapist reviewed patient's recent thoughts and behaviors. Therapist utilized CBT to address mood. Therapist processed patient's feelings to identify triggers for mood. Therapist discussed patient's stress and anxiety.   Plan: Return again in 3-4 weeks.  Diagnosis: Axis I: Major depressive disorder, recurrent episode, moderate with anxious distress    Axis II: No diagnosis    Glori Bickers, LCSW 07/31/2018

## 2018-08-07 ENCOUNTER — Encounter: Payer: Self-pay | Admitting: Orthopaedic Surgery

## 2018-08-07 ENCOUNTER — Ambulatory Visit: Payer: Medicaid Other | Admitting: Orthopaedic Surgery

## 2018-08-07 VITALS — BP 127/82 | HR 70 | Ht 67.0 in | Wt 200.0 lb

## 2018-08-07 DIAGNOSIS — G8929 Other chronic pain: Secondary | ICD-10-CM

## 2018-08-07 DIAGNOSIS — M25562 Pain in left knee: Secondary | ICD-10-CM

## 2018-08-07 NOTE — Progress Notes (Signed)
Subjective:    Patient ID: Charles Tyler, male    DOB: 09-09-1966, 52 y.o.   MRN: [id number]  HPI He has had left knee pain for about three to four weeks getting worse.  He has had swelling, popping but no giving way.  He has no trauma, no redness.  He saw Dr. Legrand Rams and had x-rays done 07-23-18 which showed: IMPRESSION: Question small knee joint effusion. No visible arthritic changes or other focal bone findings.  He was given an anti-inflammatory but still has pain. It is worse at the end of the day.  He has some right knee pain as well.  He has used ice and heat with no help as well.   Review of Systems  Constitutional: Positive for activity change.  Musculoskeletal: Positive for arthralgias, gait problem and joint swelling.  Neurological: Positive for seizures and headaches.  All other systems reviewed and are negative.  For Review of Systems, all other systems reviewed and are negative.  The following is a summary of the past history medically, past history surgically, known current medicines, social history and family history.  This information is gathered electronically by the computer from prior information and documentation.  I review this each visit and have found including this information at this point in the chart is beneficial and informative.   Past Medical History:  Diagnosis Date  . Coronary artery disease 08/2017   Stents placed in [place] in 2016. Nonobstructive lesions on 10/18 angiogram.  . Depression with anxiety   . History of cardiac catheterization    Done in [place] - details not clear  . Hyperlipidemia   . Migraine   . Seizures ([place])     Past Surgical History:  Procedure Laterality Date  . CHOLECYSTECTOMY    . LEFT HEART CATH AND CORONARY ANGIOGRAPHY N/A 08/23/2017   Procedure: LEFT HEART CATH AND CORONARY ANGIOGRAPHY;  Surgeon: Belva Crome, MD;  Location: [facility];  Service: Cardiovascular;  Laterality: N/A;    Current  Outpatient Medications on File Prior to Visit  Medication Sig Dispense Refill  . atorvastatin (LIPITOR) 40 MG tablet Take 40 mg by mouth daily.  3  . diphenhydrAMINE (BENADRYL) 25 mg capsule Take 25 mg by mouth every 6 (six) hours as needed for itching.    . divalproex (DEPAKOTE ER) 500 MG 24 hr tablet Take 3 tablets daily 90 tablet 11  . FLUoxetine (PROZAC) 40 MG capsule Take 1 capsule (40 mg total) by mouth daily. 90 capsule 0  . levETIRAcetam (KEPPRA) 750 MG tablet Take 2 tablets twice a day 120 tablet 11  . mirtazapine (REMERON) 15 MG tablet Take 2 tablets (30 mg total) by mouth at bedtime. 90 tablet 0   No current facility-administered medications on file prior to visit.     Social History   Socioeconomic History  . Marital status: Significant Other    Spouse name: Not on file  . Number of children: 4  . Years of education: 2 years college  . Highest education level: Not on file  Occupational History  . Occupation: Unemployed  Social Needs  . Financial resource strain: Not on file  . Food insecurity:    Worry: Not on file    Inability: Not on file  . Transportation needs:    Medical: Not on file    Non-medical: Not on file  Tobacco Use  . Smoking status: Current Some Day Smoker    Packs/day: 1.00    Types: Cigarettes  Last attempt to quit: 01/05/2018    Years since quitting: 0.5  . Smokeless tobacco: Never Used  . Tobacco comment: off and on   Substance and Sexual Activity  . Alcohol use: Not Currently    Comment: rarely, 09-14-2017 per pt occas  . Drug use: No    Comment: 09-14-2017 per pt stopped 30 years ago - Cocaine, Marijuana and speed   . Sexual activity: Yes    Birth control/protection: None  Lifestyle  . Physical activity:    Days per week: Not on file    Minutes per session: Not on file  . Stress: Not on file  Relationships  . Social connections:    Talks on phone: Not on file    Gets together: Not on file    Attends religious service: Not on file     Active member of club or organization: Not on file    Attends meetings of clubs or organizations: Not on file    Relationship status: Not on file  . Intimate partner violence:    Fear of current or ex partner: Not on file    Emotionally abused: Not on file    Physically abused: Not on file    Forced sexual activity: Not on file  Other Topics Concern  . Not on file  Social History Narrative   Lives at home with his fiancee.   Right-handed.   2 cups caffeine per day.    Family History  Problem Relation Age of Onset  . CAD Father        Premature CAD status post CABG  . Heart attack Father   . COPD Father   . Heart disease Father   . CAD Paternal Grandfather        Premature CAD status post CABG  . Heart disease Paternal Grandfather   . Alcohol abuse Maternal Grandfather   . Heart disease Maternal Grandfather   . Alcohol abuse Maternal Grandmother   . COPD Mother   . Hyperlipidemia Sister   . Asthma Sister   . Asthma Brother   . Allergies Sister   . Asthma Sister   . Multiple sclerosis Daughter   . Seizures Daughter     BP 127/82   Pulse 70   Ht 5\' 7"  (1.702 m)   Wt 200 lb (90.7 kg)   BMI 31.32 kg/m   Body mass index is 31.32 kg/m.      Objective:   Physical Exam  Constitutional: He is oriented to person, place, and time. He appears well-developed and well-nourished.  HENT:  Head: Normocephalic and atraumatic.  Eyes: Pupils are equal, round, and reactive to light. Conjunctivae and EOM are normal.  Neck: Normal range of motion. Neck supple.  Cardiovascular: Normal rate, regular rhythm and intact distal pulses.  Pulmonary/Chest: Effort normal.  Abdominal: Soft.  Musculoskeletal:       Left knee: He exhibits decreased range of motion, swelling and effusion. Tenderness found. Medial joint line tenderness noted.       Legs: Neurological: He is alert and oriented to person, place, and time. He has normal reflexes. He displays normal reflexes. No cranial nerve  deficit. He exhibits normal muscle tone. Coordination normal.  Skin: Skin is warm and dry.  Psychiatric: He has a normal mood and affect. His behavior is normal. Judgment and thought content normal.     I have reviewed the x-rays and report and also Dr. Josephine Cables notes.     Assessment & Plan:   Encounter  Diagnosis  Name Primary?  . Chronic pain of left knee Yes   I am concerned about a possible medial meniscus tear.  I feel he may need MRI but he has to wait another month before his insurance will cover it.  PROCEDURE NOTE:  The patient requests injections of the left knee , verbal consent was obtained.  The left knee was prepped appropriately after time out was performed.   Sterile technique was observed and injection of 1 cc of Depo-Medrol 40 mg with several cc's of plain xylocaine. Anesthesia was provided by ethyl chloride and a 20-gauge needle was used to inject the knee area. The injection was tolerated well.  A band aid dressing was applied.  The patient was advised to apply ice later today and tomorrow to the injection sight as needed.  I will see him in one month.  Continue his anti-inflammatory.  Call if any problem.  Precautions discussed.   Electronically Signed Sanjuana Kava, MD 10/1/20199:32 AM

## 2018-08-21 ENCOUNTER — Ambulatory Visit (HOSPITAL_COMMUNITY): Payer: Self-pay | Admitting: Licensed Clinical Social Worker

## 2018-08-29 ENCOUNTER — Other Ambulatory Visit: Payer: Self-pay | Admitting: Neurology

## 2018-08-29 MED ORDER — DIVALPROEX SODIUM ER 500 MG PO TB24: ORAL_TABLET | ORAL | 11 refills | Status: DC

## 2018-09-04 ENCOUNTER — Ambulatory Visit (HOSPITAL_COMMUNITY): Payer: Self-pay | Admitting: Licensed Clinical Social Worker

## 2018-09-04 ENCOUNTER — Encounter: Payer: Self-pay | Admitting: Orthopaedic Surgery

## 2018-09-04 ENCOUNTER — Ambulatory Visit: Payer: Medicaid Other | Admitting: Orthopaedic Surgery

## 2018-09-04 VITALS — BP 123/80 | HR 71 | Ht 67.0 in | Wt 204.0 lb

## 2018-09-04 DIAGNOSIS — M25562 Pain in left knee: Secondary | ICD-10-CM

## 2018-09-04 DIAGNOSIS — G8929 Other chronic pain: Secondary | ICD-10-CM

## 2018-09-04 NOTE — Progress Notes (Signed)
Patient Charles Tyler, male DOB:25-Apr-1966, 52 y.o. QZE:[id number]  Chief Complaint  Patient presents with  . Knee Pain    injection helped left knee    HPI  Charles Tyler is a 52 y.o. male who has left knee pain.  He is better after the injection but still has popping and some swelling. He has no giving way.  He has no locking. He is active and is taking his Mobic.   Body mass index is 31.95 kg/m.  ROS  Review of Systems  Constitutional: Positive for activity change.  Musculoskeletal: Positive for arthralgias, gait problem and joint swelling.  Neurological: Positive for seizures and headaches.  All other systems reviewed and are negative.   All other systems reviewed and are negative.  The following is a summary of the past history medically, past history surgically, known current medicines, social history and family history.  This information is gathered electronically by the computer from prior information and documentation.  I review this each visit and have found including this information at this point in the chart is beneficial and informative.    Past Medical History:  Diagnosis Date  . Coronary artery disease 08/2017   Stents placed in [place] in 2016. Nonobstructive lesions on 10/18 angiogram.  . Depression with anxiety   . History of cardiac catheterization    Done in [place] - details not clear  . Hyperlipidemia   . Migraine   . Seizures ([place])     Past Surgical History:  Procedure Laterality Date  . CHOLECYSTECTOMY    . LEFT HEART CATH AND CORONARY ANGIOGRAPHY N/A 08/23/2017   Procedure: LEFT HEART CATH AND CORONARY ANGIOGRAPHY;  Surgeon: Belva Crome, MD;  Location: [facility];  Service: Cardiovascular;  Laterality: N/A;    Family History  Problem Relation Age of Onset  . CAD Father        Premature CAD status post CABG  . Heart attack Father   . COPD Father   . Heart disease Father   . CAD Paternal Grandfather         Premature CAD status post CABG  . Heart disease Paternal Grandfather   . Alcohol abuse Maternal Grandfather   . Heart disease Maternal Grandfather   . Alcohol abuse Maternal Grandmother   . COPD Mother   . Hyperlipidemia Sister   . Asthma Sister   . Asthma Brother   . Allergies Sister   . Asthma Sister   . Multiple sclerosis Daughter   . Seizures Daughter     Social History Social History   Tobacco Use  . Smoking status: Current Some Day Smoker    Packs/day: 1.00    Types: Cigarettes    Last attempt to quit: 01/05/2018    Years since quitting: 0.6  . Smokeless tobacco: Never Used  . Tobacco comment: off and on   Substance Use Topics  . Alcohol use: Not Currently    Comment: rarely, 09-14-2017 per pt occas  . Drug use: No    Comment: 09-14-2017 per pt stopped 30 years ago - Cocaine, Marijuana and speed     Allergies  Allergen Reactions  . Aspirin Nausea And Vomiting    325 mg   . Other Hives    Walnuts    Current Outpatient Medications  Medication Sig Dispense Refill  . atorvastatin (LIPITOR) 40 MG tablet Take 40 mg by mouth daily.  3  . diphenhydrAMINE (BENADRYL) 25 mg capsule Take 25 mg by mouth every 6 (six)  hours as needed for itching.    . divalproex (DEPAKOTE ER) 500 MG 24 hr tablet Take 1 tab in AM, 3 tabs in PM 120 tablet 11  . FLUoxetine (PROZAC) 40 MG capsule Take 1 capsule (40 mg total) by mouth daily. 90 capsule 0  . levETIRAcetam (KEPPRA) 750 MG tablet Take 2 tablets twice a day 120 tablet 11  . meloxicam (MOBIC) 15 MG tablet TAKE 1 TABLET BY MOUTH ONCE DAILY WITH MEALS  3  . mirtazapine (REMERON) 15 MG tablet Take 2 tablets (30 mg total) by mouth at bedtime. 90 tablet 0   No current facility-administered medications for this visit.      Physical Exam  Blood pressure 123/80, pulse 71, height 5\' 7"  (1.702 m), weight 204 lb (92.5 kg).  Constitutional: overall normal hygiene, normal nutrition, well developed, normal grooming, normal body  habitus. Assistive device:none  Musculoskeletal: gait and station Limp none, muscle tone and strength are normal, no tremors or atrophy is present.  .  Neurological: coordination overall normal.  Deep tendon reflex/nerve stretch intact.  Sensation normal.  Cranial nerves II-XII intact.   Skin:   Normal overall no scars, lesions, ulcers or rashes. No psoriasis.  Psychiatric: Alert and oriented x 3.  Recent memory intact, remote memory unclear.  Normal mood and affect. Well groomed.  Good eye contact.  Cardiovascular: overall no swelling, no varicosities, no edema bilaterally, normal temperatures of the legs and arms, no clubbing, cyanosis and good capillary refill.  Lymphatic: palpation is normal.  The left lower extremity is examined:  Inspection:  Thigh:  Non-tender and no defects  Knee has swelling 1/2+ effusion.                        Joint tenderness is present                        Patient is tender over the medial joint line  Lower Leg:  Has normal appearance and no tenderness or defects  Ankle:  Non-tender and no defects  Foot:  Non-tender and no defects Range of Motion:  Knee:  Range of motion is: 0-115                        Crepitus is  present  Ankle:  Range of motion is normal. Strength and Tone:  The left lower extremity has normal strength and tone. Stability:  Knee:  The knee is stable.  Ankle:  The ankle is stable.   All other systems reviewed and are negative   The patient has been educated about the nature of the problem(s) and counseled on treatment options.  The patient appeared to understand what I have discussed and is in agreement with it.  Encounter Diagnosis  Name Primary?  . Chronic pain of left knee Yes    PLAN Call if any problems.  Precautions discussed.  Continue current medications.   Return to clinic 1 month   Electronically Signed Sanjuana Kava, MD 10/29/20199:49 AM

## 2018-09-18 ENCOUNTER — Ambulatory Visit (HOSPITAL_COMMUNITY): Payer: Self-pay | Admitting: Licensed Clinical Social Worker

## 2018-10-09 ENCOUNTER — Ambulatory Visit: Payer: Medicaid Other | Admitting: Orthopaedic Surgery

## 2018-10-09 ENCOUNTER — Encounter: Payer: Self-pay | Admitting: Orthopaedic Surgery

## 2018-10-09 VITALS — BP 131/81 | HR 83 | Ht 67.0 in | Wt 209.0 lb

## 2018-10-09 DIAGNOSIS — F17210 Nicotine dependence, cigarettes, uncomplicated: Secondary | ICD-10-CM | POA: Diagnosis not present

## 2018-10-09 DIAGNOSIS — M25562 Pain in left knee: Secondary | ICD-10-CM

## 2018-10-09 DIAGNOSIS — G8929 Other chronic pain: Secondary | ICD-10-CM | POA: Diagnosis not present

## 2018-10-09 NOTE — Patient Instructions (Signed)
Steps to Quit Smoking Smoking tobacco can be bad for your health. It can also affect almost every organ in your body. Smoking puts you and people around you at risk for many serious long-lasting (chronic) diseases. Quitting smoking is hard, but it is one of the best things that you can do for your health. It is never too late to quit. What are the benefits of quitting smoking? When you quit smoking, you lower your risk for getting serious diseases and conditions. They can include:  Lung cancer or lung disease.  Heart disease.  Stroke.  Heart attack.  Not being able to have children (infertility).  Weak bones (osteoporosis) and broken bones (fractures).  If you have coughing, wheezing, and shortness of breath, those symptoms may get better when you quit. You may also get sick less often. If you are pregnant, quitting smoking can help to lower your chances of having a baby of low birth weight. What can I do to help me quit smoking? Talk with your doctor about what can help you quit smoking. Some things you can do (strategies) include:  Quitting smoking totally, instead of slowly cutting back how much you smoke over a period of time.  Going to in-person counseling. You are more likely to quit if you go to many counseling sessions.  Using resources and support systems, such as: ? Online chats with a counselor. ? Phone quitlines. ? Printed self-help materials. ? Support groups or group counseling. ? Text messaging programs. ? Mobile phone apps or applications.  Taking medicines. Some of these medicines may have nicotine in them. If you are pregnant or breastfeeding, do not take any medicines to quit smoking unless your doctor says it is okay. Talk with your doctor about counseling or other things that can help you.  Talk with your doctor about using more than one strategy at the same time, such as taking medicines while you are also going to in-person counseling. This can help make  quitting easier. What things can I do to make it easier to quit? Quitting smoking might feel very hard at first, but there is a lot that you can do to make it easier. Take these steps:  Talk to your family and friends. Ask them to support and encourage you.  Call phone quitlines, reach out to support groups, or work with a counselor.  Ask people who smoke to not smoke around you.  Avoid places that make you want (trigger) to smoke, such as: ? Bars. ? Parties. ? Smoke-break areas at work.  Spend time with people who do not smoke.  Lower the stress in your life. Stress can make you want to smoke. Try these things to help your stress: ? Getting regular exercise. ? Deep-breathing exercises. ? Yoga. ? Meditating. ? Doing a body scan. To do this, close your eyes, focus on one area of your body at a time from head to toe, and notice which parts of your body are tense. Try to relax the muscles in those areas.  Download or buy apps on your mobile phone or tablet that can help you stick to your quit plan. There are many free apps, such as QuitGuide from the CDC (Centers for Disease Control and Prevention). You can find more support from smokefree.gov and other websites.  This information is not intended to replace advice given to you by your health care provider. Make sure you discuss any questions you have with your health care provider. Document Released: 08/20/2009 Document   Revised: 06/21/2016 Document Reviewed: 03/10/2015 Elsevier Interactive Patient Education  2018 Elsevier Inc.  

## 2018-10-09 NOTE — Progress Notes (Signed)
Patient Charles Tyler, male DOB:1965/12/08, 52 y.o. EXB:[id number]  Chief Complaint  Patient presents with  . Knee Pain    left/ c/o "fluid"    HPI  Charles Tyler is a 52 y.o. male who has continued pain and swelling of the left knee with feeling it may give way.  He has no new trauma.  He has popping but no redness. I am concerned about a meniscus tear and would like to get a MRI.   Body mass index is 32.73 kg/m.  ROS  Review of Systems  Constitutional: Positive for activity change.  Musculoskeletal: Positive for arthralgias, gait problem and joint swelling.  Neurological: Positive for seizures and headaches.  All other systems reviewed and are negative.   All other systems reviewed and are negative.  The following is a summary of the past history medically, past history surgically, known current medicines, social history and family history.  This information is gathered electronically by the computer from prior information and documentation.  I review this each visit and have found including this information at this point in the chart is beneficial and informative.    Past Medical History:  Diagnosis Date  . Coronary artery disease 08/2017   Stents placed in [place] in 2016. Nonobstructive lesions on 10/18 angiogram.  . Depression with anxiety   . History of cardiac catheterization    Done in [place] - details not clear  . Hyperlipidemia   . Migraine   . Seizures ([place])     Past Surgical History:  Procedure Laterality Date  . CHOLECYSTECTOMY    . LEFT HEART CATH AND CORONARY ANGIOGRAPHY N/A 08/23/2017   Procedure: LEFT HEART CATH AND CORONARY ANGIOGRAPHY;  Surgeon: Belva Crome, MD;  Location: [facility];  Service: Cardiovascular;  Laterality: N/A;    Family History  Problem Relation Age of Onset  . CAD Father        Premature CAD status post CABG  . Heart attack Father   . COPD Father   . Heart disease Father   . CAD Paternal  Grandfather        Premature CAD status post CABG  . Heart disease Paternal Grandfather   . Alcohol abuse Maternal Grandfather   . Heart disease Maternal Grandfather   . Alcohol abuse Maternal Grandmother   . COPD Mother   . Hyperlipidemia Sister   . Asthma Sister   . Asthma Brother   . Allergies Sister   . Asthma Sister   . Multiple sclerosis Daughter   . Seizures Daughter     Social History Social History   Tobacco Use  . Smoking status: Current Some Day Smoker    Packs/day: 1.00    Types: Cigarettes    Last attempt to quit: 01/05/2018    Years since quitting: 0.7  . Smokeless tobacco: Never Used  . Tobacco comment: off and on   Substance Use Topics  . Alcohol use: Not Currently    Comment: rarely, 09-14-2017 per pt occas  . Drug use: No    Comment: 09-14-2017 per pt stopped 30 years ago - Cocaine, Marijuana and speed     Allergies  Allergen Reactions  . Aspirin Nausea And Vomiting    325 mg   . Other Hives    Walnuts    Current Outpatient Medications  Medication Sig Dispense Refill  . atorvastatin (LIPITOR) 40 MG tablet Take 40 mg by mouth daily.  3  . diphenhydrAMINE (BENADRYL) 25 mg capsule Take 25 mg  by mouth every 6 (six) hours as needed for itching.    . divalproex (DEPAKOTE ER) 500 MG 24 hr tablet Take 1 tab in AM, 3 tabs in PM 120 tablet 11  . FLUoxetine (PROZAC) 40 MG capsule Take 1 capsule (40 mg total) by mouth daily. 90 capsule 0  . levETIRAcetam (KEPPRA) 750 MG tablet Take 2 tablets twice a day 120 tablet 11  . meloxicam (MOBIC) 15 MG tablet TAKE 1 TABLET BY MOUTH ONCE DAILY WITH MEALS  3  . mirtazapine (REMERON) 15 MG tablet Take 2 tablets (30 mg total) by mouth at bedtime. 90 tablet 0   No current facility-administered medications for this visit.      Physical Exam  Blood pressure 131/81, pulse 83, height 5\' 7"  (1.702 m), weight 209 lb (94.8 kg).  Constitutional: overall normal hygiene, normal nutrition, well developed, normal grooming, normal  body habitus. Assistive device:none  Musculoskeletal: gait and station Limp left, muscle tone and strength are normal, no tremors or atrophy is present.  .  Neurological: coordination overall normal.  Deep tendon reflex/nerve stretch intact.  Sensation normal.  Cranial nerves II-XII intact.   Skin:   Normal overall no scars, lesions, ulcers or rashes. No psoriasis.  Psychiatric: Alert and oriented x 3.  Recent memory intact, remote memory unclear.  Normal mood and affect. Well groomed.  Good eye contact.  Cardiovascular: overall no swelling, no varicosities, no edema bilaterally, normal temperatures of the legs and arms, no clubbing, cyanosis and good capillary refill.  Lymphatic: palpation is normal.  Left knee has effusion, pain medially, positive medial [place], ROM 0 to 105 with pain, NV intact.  Limp to the left.  All other systems reviewed and are negative   The patient has been educated about the nature of the problem(s) and counseled on treatment options.  The patient appeared to understand what I have discussed and is in agreement with it.  Encounter Diagnoses  Name Primary?  . Chronic pain of left knee Yes  . Cigarette nicotine dependence without complication     PLAN Call if any problems.  Precautions discussed.  Continue current medications.   Return to clinic MRI of the left knee   Electronically Signed Sanjuana Kava, MD 12/3/201910:30 AM

## 2018-10-11 ENCOUNTER — Other Ambulatory Visit (HOSPITAL_COMMUNITY): Payer: Self-pay

## 2018-10-15 NOTE — Progress Notes (Deleted)
BH MD/PA/NP OP Progress Note  10/15/2018 2:38 PM Charles Tyler  MRN:  [id number]  Chief Complaint:  HPI:   Discharged from therapist  Visit Diagnosis: No diagnosis found.  Past Psychiatric History: Please see initial evaluation for full details. I have reviewed the history. No updates at this time.     Past Medical History:  Past Medical History:  Diagnosis Date  . Coronary artery disease 08/2017   Stents placed in [place] in 2016. Nonobstructive lesions on 10/18 angiogram.  . Depression with anxiety   . History of cardiac catheterization    Done in [place] - details not clear  . Hyperlipidemia   . Migraine   . Seizures ([place])     Past Surgical History:  Procedure Laterality Date  . CHOLECYSTECTOMY    . LEFT HEART CATH AND CORONARY ANGIOGRAPHY N/A 08/23/2017   Procedure: LEFT HEART CATH AND CORONARY ANGIOGRAPHY;  Surgeon: Belva Crome, MD;  Location: [facility];  Service: Cardiovascular;  Laterality: N/A;    Family Psychiatric History: Please see initial evaluation for full details. I have reviewed the history. No updates at this time.     Family History:  Family History  Problem Relation Age of Onset  . CAD Father        Premature CAD status post CABG  . Heart attack Father   . COPD Father   . Heart disease Father   . CAD Paternal Grandfather        Premature CAD status post CABG  . Heart disease Paternal Grandfather   . Alcohol abuse Maternal Grandfather   . Heart disease Maternal Grandfather   . Alcohol abuse Maternal Grandmother   . COPD Mother   . Hyperlipidemia Sister   . Asthma Sister   . Asthma Brother   . Allergies Sister   . Asthma Sister   . Multiple sclerosis Daughter   . Seizures Daughter     Social History:  Social History   Socioeconomic History  . Marital status: Significant Other    Spouse name: Not on file  . Number of children: 4  . Years of education: 2 years college  . Highest education level: Not on file   Occupational History  . Occupation: Unemployed  Social Needs  . Financial resource strain: Not on file  . Food insecurity:    Worry: Not on file    Inability: Not on file  . Transportation needs:    Medical: Not on file    Non-medical: Not on file  Tobacco Use  . Smoking status: Current Some Day Smoker    Packs/day: 1.00    Types: Cigarettes    Last attempt to quit: 01/05/2018    Years since quitting: 0.7  . Smokeless tobacco: Never Used  . Tobacco comment: off and on   Substance and Sexual Activity  . Alcohol use: Not Currently    Comment: rarely, 09-14-2017 per pt occas  . Drug use: No    Comment: 09-14-2017 per pt stopped 30 years ago - Cocaine, Marijuana and speed   . Sexual activity: Yes    Birth control/protection: None  Lifestyle  . Physical activity:    Days per week: Not on file    Minutes per session: Not on file  . Stress: Not on file  Relationships  . Social connections:    Talks on phone: Not on file    Gets together: Not on file    Attends religious service: Not on file  Active member of club or organization: Not on file    Attends meetings of clubs or organizations: Not on file    Relationship status: Not on file  Other Topics Concern  . Not on file  Social History Narrative   Lives at home with his fiancee.   Right-handed.   2 cups caffeine per day.    Allergies:  Allergies  Allergen Reactions  . Aspirin Nausea And Vomiting    325 mg   . Other Hives    Walnuts    Metabolic Disorder Labs: No results found for: HGBA1C, MPG Lab Results  Component Value Date   PROLACTIN 10.4 02/15/2018   Lab Results  Component Value Date   CHOL 182 08/24/2017   TRIG 94 08/24/2017   HDL 30 (L) 08/24/2017   CHOLHDL 6.1 08/24/2017   VLDL 19 08/24/2017   LDLCALC 133 (H) 08/24/2017   Lab Results  Component Value Date   TSH 2.497 02/16/2018   TSH 1.940 09/10/2017    Therapeutic Level Labs: No results found for: LITHIUM Lab Results  Component Value  Date   VALPROATE 47 (L) 03/01/2018   VALPROATE <10 (L) 01/24/2018   No components found for:  CBMZ  Current Medications: Current Outpatient Medications  Medication Sig Dispense Refill  . atorvastatin (LIPITOR) 40 MG tablet Take 40 mg by mouth daily.  3  . diphenhydrAMINE (BENADRYL) 25 mg capsule Take 25 mg by mouth every 6 (six) hours as needed for itching.    . divalproex (DEPAKOTE ER) 500 MG 24 hr tablet Take 1 tab in AM, 3 tabs in PM 120 tablet 11  . FLUoxetine (PROZAC) 40 MG capsule Take 1 capsule (40 mg total) by mouth daily. 90 capsule 0  . levETIRAcetam (KEPPRA) 750 MG tablet Take 2 tablets twice a day 120 tablet 11  . meloxicam (MOBIC) 15 MG tablet TAKE 1 TABLET BY MOUTH ONCE DAILY WITH MEALS  3  . mirtazapine (REMERON) 15 MG tablet Take 2 tablets (30 mg total) by mouth at bedtime. 90 tablet 0   No current facility-administered medications for this visit.      Musculoskeletal: Strength & Muscle Tone: within normal limits Gait & Station: normal Patient leans: N/A  Psychiatric Specialty Exam: ROS  There were no vitals taken for this visit.There is no height or weight on file to calculate BMI.  General Appearance: Fairly Groomed  Eye Contact:  Good  Speech:  Clear and Coherent  Volume:  Normal  Mood:  {BHH MOOD:22306}  Affect:  {Affect (PAA):22687}  Thought Process:  Coherent  Orientation:  Full (Time, Place, and Person)  Thought Content: Logical   Suicidal Thoughts:  {ST/HT (PAA):22692}  Homicidal Thoughts:  {ST/HT (PAA):22692}  Memory:  Immediate;   Good  Judgement:  {Judgement (PAA):22694}  Insight:  {Insight (PAA):22695}  Psychomotor Activity:  Normal  Concentration:  Concentration: Good and Attention Span: Good  Recall:  Good  Fund of Knowledge: Good  Language: Good  Akathisia:  No  Handed:  Right  AIMS (if indicated): not done  Assets:  Communication Skills Desire for Improvement  ADL's:  Intact  Cognition: WNL  Sleep:  {BHH GOOD/FAIR/POOR:22877}    Screenings:   Assessment and Plan:  Charles Tyler is a 52 y.o. 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  # PTSD # MDD,  moderate, recurrent without psychotic features Patient reports overall improvement in neurovegetative and PTSD symptoms since the last appointment.  However, there has been significant weight gain since the initial appointment.  Will taper down mirtazapine to minimize potential weight gain.  Will uptitrate fluoxetine to target PTSD and depression.  Discussed behavioral activation.  He is encouraged to continue to see a therapist.   Plan  1. Increase Fluoxetine 40 mg daily  2. Decrease mirtazapine 15 mg at night  3.Return to clinic inthree monthsfor 3mins  The patient demonstrates the following risk factors for suicide: Chronic risk factors for suicide include:psychiatric disorder ofdepression, PTSDand substance use disorder. Acute risk factorsfor suicide include: family or marital conflict. Protective factorsfor this patient include: responsibility to others (children, family), coping skills and hope for the future. Considering these factors, the overall suicide risk at this point appears to below. Patientisappropriate for outpatient follow up.   Norman Clay, MD 10/15/2018, 2:38 PM

## 2018-10-16 ENCOUNTER — Other Ambulatory Visit (HOSPITAL_COMMUNITY): Payer: Self-pay | Admitting: Psychiatry

## 2018-10-16 MED ORDER — MIRTAZAPINE 15 MG PO TABS: 30.0000 mg | ORAL_TABLET | Freq: Every day | ORAL | 0 refills | Status: DC

## 2018-10-19 ENCOUNTER — Ambulatory Visit (HOSPITAL_COMMUNITY): Payer: Self-pay | Admitting: Hematology

## 2018-10-22 ENCOUNTER — Ambulatory Visit (HOSPITAL_COMMUNITY): Payer: Self-pay

## 2018-10-22 ENCOUNTER — Ambulatory Visit (HOSPITAL_COMMUNITY): Payer: Self-pay | Admitting: Psychiatry

## 2018-11-08 ENCOUNTER — Ambulatory Visit (HOSPITAL_COMMUNITY): Payer: Medicaid Other

## 2018-11-12 ENCOUNTER — Inpatient Hospital Stay (HOSPITAL_COMMUNITY): Payer: Medicaid Other | Attending: Hematology

## 2018-11-12 DIAGNOSIS — F17210 Nicotine dependence, cigarettes, uncomplicated: Secondary | ICD-10-CM | POA: Diagnosis not present

## 2018-11-12 DIAGNOSIS — D751 Secondary polycythemia: Secondary | ICD-10-CM | POA: Diagnosis present

## 2018-11-12 DIAGNOSIS — Z79899 Other long term (current) drug therapy: Secondary | ICD-10-CM | POA: Insufficient documentation

## 2018-11-12 DIAGNOSIS — G40909 Epilepsy, unspecified, not intractable, without status epilepticus: Secondary | ICD-10-CM | POA: Diagnosis not present

## 2018-11-12 DIAGNOSIS — D696 Thrombocytopenia, unspecified: Secondary | ICD-10-CM | POA: Insufficient documentation

## 2018-11-12 LAB — CBC WITH DIFFERENTIAL/PLATELET
ABS IMMATURE GRANULOCYTES: 0.02 10*3/uL (ref 0.00–0.07)
BASOS ABS: 0.1 10*3/uL (ref 0.0–0.1)
BASOS PCT: 1 %
Eosinophils Absolute: 1.1 10*3/uL — ABNORMAL HIGH (ref 0.0–0.5)
Eosinophils Relative: 12 %
HCT: 54.1 % — ABNORMAL HIGH (ref 39.0–52.0)
Hemoglobin: 18.3 g/dL — ABNORMAL HIGH (ref 13.0–17.0)
IMMATURE GRANULOCYTES: 0 %
LYMPHS ABS: 1.9 10*3/uL (ref 0.7–4.0)
LYMPHS PCT: 22 %
MCH: 31.6 pg (ref 26.0–34.0)
MCHC: 33.8 g/dL (ref 30.0–36.0)
MCV: 93.3 fL (ref 80.0–100.0)
MONO ABS: 0.7 10*3/uL (ref 0.1–1.0)
Monocytes Relative: 8 %
NEUTROS ABS: 4.9 10*3/uL (ref 1.7–7.7)
NRBC: 0 % (ref 0.0–0.2)
Neutrophils Relative %: 57 %
PLATELETS: 174 10*3/uL (ref 150–400)
RBC: 5.8 MIL/uL (ref 4.22–5.81)
RDW: 13.2 % (ref 11.5–15.5)
WBC: 8.6 10*3/uL (ref 4.0–10.5)

## 2018-11-12 LAB — COMPREHENSIVE METABOLIC PANEL
ALBUMIN: 4.2 g/dL (ref 3.5–5.0)
ALT: 53 U/L — AB (ref 0–44)
AST: 29 U/L (ref 15–41)
Alkaline Phosphatase: 56 U/L (ref 38–126)
Anion gap: 6 (ref 5–15)
BUN: 19 mg/dL (ref 6–20)
CHLORIDE: 106 mmol/L (ref 98–111)
CO2: 25 mmol/L (ref 22–32)
CREATININE: 0.79 mg/dL (ref 0.61–1.24)
Calcium: 9.2 mg/dL (ref 8.9–10.3)
GFR calc non Af Amer: >60 mL/min (ref >60)
GLUCOSE: 90 mg/dL (ref 70–99)
Potassium: 4.3 mmol/L (ref 3.5–5.1)
SODIUM: 137 mmol/L (ref 135–145)
Total Bilirubin: 0.5 mg/dL (ref 0.3–1.2)
Total Protein: 7 g/dL (ref 6.5–8.1)

## 2018-11-12 LAB — LACTATE DEHYDROGENASE: LDH: 148 U/L (ref 98–192)

## 2018-11-14 ENCOUNTER — Ambulatory Visit (HOSPITAL_COMMUNITY): Payer: Self-pay | Admitting: Hematology

## 2018-11-14 ENCOUNTER — Ambulatory Visit (HOSPITAL_COMMUNITY): Admission: RE | Admit: 2018-11-14 | Payer: Medicaid Other | Source: Ambulatory Visit

## 2018-11-15 ENCOUNTER — Ambulatory Visit (HOSPITAL_COMMUNITY)
Admission: RE | Admit: 2018-11-15 | Discharge: 2018-11-15 | Disposition: A | Discharge status: Home / Self Care | Payer: Medicaid Other | Source: Ambulatory Visit | Attending: Orthopaedic Surgery | Admitting: Orthopaedic Surgery

## 2018-11-15 DIAGNOSIS — G8929 Other chronic pain: Secondary | ICD-10-CM | POA: Diagnosis present

## 2018-11-15 DIAGNOSIS — M25562 Pain in left knee: Secondary | ICD-10-CM | POA: Diagnosis not present

## 2018-11-21 ENCOUNTER — Encounter (HOSPITAL_COMMUNITY): Payer: Self-pay | Admitting: Hematology

## 2018-11-21 ENCOUNTER — Inpatient Hospital Stay (HOSPITAL_BASED_OUTPATIENT_CLINIC_OR_DEPARTMENT_OTHER): Payer: Medicaid Other | Admitting: Hematology

## 2018-11-21 ENCOUNTER — Other Ambulatory Visit: Payer: Self-pay

## 2018-11-21 VITALS — BP 150/90 | HR 84 | Temp 98.4°F | Resp 16 | Wt 213.0 lb

## 2018-11-21 DIAGNOSIS — D751 Secondary polycythemia: Secondary | ICD-10-CM

## 2018-11-21 DIAGNOSIS — F17210 Nicotine dependence, cigarettes, uncomplicated: Secondary | ICD-10-CM

## 2018-11-21 DIAGNOSIS — D696 Thrombocytopenia, unspecified: Secondary | ICD-10-CM

## 2018-11-21 DIAGNOSIS — G40909 Epilepsy, unspecified, not intractable, without status epilepticus: Secondary | ICD-10-CM | POA: Diagnosis not present

## 2018-11-21 NOTE — Progress Notes (Signed)
[address], [place] [address]   CLINIC:  Medical Oncology/Hematology  PCP:  Rosita Fire, [place] [address] [phone]   REASON FOR VISIT: Follow-up for erythrocytosis and thrombocytopenia   CURRENT THERAPY: work up coming back for results of bone marrow   INTERVAL HISTORY:  Charles Tyler 53 y.o. male returns for routine follow-up for erythrocytosis and thrombocytopenia. He has started smoking again and he smokes 1/2 pack per day. He is having diarrhea occasionally. Denies any nausea, vomiting, or diarrhea. Denies any new pains. Had not noticed any recent bleeding such as epistaxis, hematuria or hematochezia. Denies recent chest pain on exertion, shortness of breath on minimal exertion, pre-syncopal episodes, or palpitations. Denies any numbness or tingling in hands or feet. Denies any recent fevers, infections, or recent hospitalizations. He reports his appetite at 100% and her energy level at 50%. He was educated about smoking cessation.     REVIEW OF SYSTEMS:  Review of Systems  Gastrointestinal: Positive for diarrhea.     PAST MEDICAL/SURGICAL HISTORY:  Past Medical History:  Diagnosis Date  . Coronary artery disease 08/2017   Stents placed in [place] in 2016. Nonobstructive lesions on 10/18 angiogram.  . Depression with anxiety   . History of cardiac catheterization    Done in [place] - details not clear  . Hyperlipidemia   . Migraine   . Seizures ([place])    Past Surgical History:  Procedure Laterality Date  . CHOLECYSTECTOMY    . LEFT HEART CATH AND CORONARY ANGIOGRAPHY N/A 08/23/2017   Procedure: LEFT HEART CATH AND CORONARY ANGIOGRAPHY;  Surgeon: Belva Crome, MD;  Location: [facility];  Service: Cardiovascular;  Laterality: N/A;     SOCIAL HISTORY:  Social History   Socioeconomic History  . Marital status: Significant Other    Spouse name: Not on file  . Number of  children: 4  . Years of education: 2 years college  . Highest education level: Not on file  Occupational History  . Occupation: Unemployed  Social Needs  . Financial resource strain: Not on file  . Food insecurity:    Worry: Not on file    Inability: Not on file  . Transportation needs:    Medical: Not on file    Non-medical: Not on file  Tobacco Use  . Smoking status: Current Some Day Smoker    Packs/day: 1.00    Types: Cigarettes    Last attempt to quit: 01/05/2018    Years since quitting: 0.8  . Smokeless tobacco: Never Used  . Tobacco comment: off and on   Substance and Sexual Activity  . Alcohol use: Not Currently    Comment: rarely, 09-14-2017 per pt occas  . Drug use: No    Comment: 09-14-2017 per pt stopped 30 years ago - Cocaine, Marijuana and speed   . Sexual activity: Yes    Birth control/protection: None  Lifestyle  . Physical activity:    Days per week: Not on file    Minutes per session: Not on file  . Stress: Not on file  Relationships  . Social connections:    Talks on phone: Not on file    Gets together: Not on file    Attends religious service: Not on file    Active member of club or organization: Not on file    Attends meetings of clubs or organizations: Not on file    Relationship status: Not on file  .  Intimate partner violence:    Fear of current or ex partner: Not on file    Emotionally abused: Not on file    Physically abused: Not on file    Forced sexual activity: Not on file  Other Topics Concern  . Not on file  Social History Narrative   Lives at home with his fiancee.   Right-handed.   2 cups caffeine per day.    FAMILY HISTORY:  Family History  Problem Relation Age of Onset  . CAD Father        Premature CAD status post CABG  . Heart attack Father   . COPD Father   . Heart disease Father   . CAD Paternal Grandfather        Premature CAD status post CABG  . Heart disease Paternal Grandfather   . Alcohol abuse Maternal  Grandfather   . Heart disease Maternal Grandfather   . Alcohol abuse Maternal Grandmother   . COPD Mother   . Hyperlipidemia Sister   . Asthma Sister   . Asthma Brother   . Allergies Sister   . Asthma Sister   . Multiple sclerosis Daughter   . Seizures Daughter     CURRENT MEDICATIONS:  Outpatient Encounter Medications as of 11/21/2018  Medication Sig  . atorvastatin (LIPITOR) 40 MG tablet Take 40 mg by mouth daily.  . diphenhydrAMINE (BENADRYL) 25 mg capsule Take 25 mg by mouth every 6 (six) hours as needed for itching.  . divalproex (DEPAKOTE ER) 500 MG 24 hr tablet Take 1 tab in AM, 3 tabs in PM  . FLUoxetine (PROZAC) 40 MG capsule Take 1 capsule (40 mg total) by mouth daily.  Marland Kitchen levETIRAcetam (KEPPRA) 750 MG tablet Take 2 tablets twice a day  . mirtazapine (REMERON) 15 MG tablet Take 2 tablets (30 mg total) by mouth at bedtime.  . meloxicam (MOBIC) 15 MG tablet TAKE 1 TABLET BY MOUTH ONCE DAILY WITH MEALS   No facility-administered encounter medications on file as of 11/21/2018.     ALLERGIES:  Allergies  Allergen Reactions  . Aspirin Nausea And Vomiting    325 mg   . Other Hives    Walnuts     PHYSICAL EXAM:  ECOG Performance status: 1  Vitals:   11/21/18 1100  BP: (!) 150/90  Pulse: 84  Resp: 16  Temp: 98.4 F (36.9 C)  SpO2: 95%   Filed Weights   11/21/18 1100  Weight: 213 lb (96.6 kg)    Physical Exam Constitutional:      Appearance: Normal appearance. He is normal weight.  Musculoskeletal: Normal range of motion.  Skin:    General: Skin is warm and dry.  Neurological:     Mental Status: He is alert and oriented to person, place, and time. Mental status is at baseline.  Psychiatric:        Mood and Affect: Mood normal.        Behavior: Behavior normal.        Thought Content: Thought content normal.        Judgment: Judgment normal.      LABORATORY DATA:  I have reviewed the labs as listed.  CBC    Component Value Date/Time   WBC 8.6  11/12/2018 1020   RBC 5.80 11/12/2018 1020   HGB 18.3 (H) 11/12/2018 1020   HCT 54.1 (H) 11/12/2018 1020   PLT 174 11/12/2018 1020   MCV 93.3 11/12/2018 1020   MCH 31.6 11/12/2018 1020   MCHC  33.8 11/12/2018 1020   RDW 13.2 11/12/2018 1020   LYMPHSABS 1.9 11/12/2018 1020   MONOABS 0.7 11/12/2018 1020   EOSABS 1.1 (H) 11/12/2018 1020   BASOSABS 0.1 11/12/2018 1020   CMP Latest Ref Rng & Units 11/12/2018 03/01/2018 02/16/2018  Glucose 70 - 99 mg/dL 90 86 100(H)  BUN 6 - 20 mg/dL _0 Creatinine 0.61 - 1.24 mg/dL 0.79 0.90 0.75  Sodium 135 - 145 mmol/L 137 139 140  Potassium 3.5 - 5.1 mmol/L 4.3 4.0 3.8  Chloride 98 - 111 mmol/L 106 105 110  CO2 22 - 32 mmol/L _1 Calcium 8.9 - 10.3 mg/dL 9.2 8.3(L) 8.4(L)  Total Protein 6.5 - 8.1 g/dL 7.0 5.7(L) -  Total Bilirubin 0.3 - 1.2 mg/dL 0.5 0.5 -  Alkaline Phos 38 - 126 U/L 56 46 -  AST 15 - 41 U/L 29 17 -  ALT 0 - 44 U/L 53(H) 22 -       DIAGNOSTIC IMAGING:  I have independently reviewed the scans and discussed with the patient.   I have reviewed Francene Finders, NP's note and agree with the documentation.  I personally performed a face-to-face visit, made revisions and my assessment and plan is as follows.    ASSESSMENT & PLAN:   Erythrocytosis 1.  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 Josephine Cables office showed hemoglobin of 19.2, hematocrit of 54.3 with elevated red cell count of 5.88.  White count and platelet count were within normal limits.  Differential showed 10.7% eosinophils with increased absolute eosinophil count. - On review of epic system for previous labs, his hemoglobin was found to be elevated in October 2018 in March 2019.  It has been normal other times. -Patient was diagnosed with seizure disorder and was started on Keppra and Depakote. -I have reviewed a chest x-ray from October 2018 which was showing no evidence of cardiopulmonary disease. - He smoked 2 pack/day for 20 years and  quit 11 years ago.  He quit for a month about 4 months ago but started back again.  His smoking about half pack per day now. - He was also negative for other MPN mutations including CALR, Jak exon 12-15. He also has intermittent mild thrombocytopenia over many years. - Bone marrow biopsy on 05/30/2018 showed trilineage hematopoiesis. 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 -His last hemoglobin improved to 17.3 on the day of bone marrow biopsy, as he quit smoking 4 weeks prior. -We reviewed his blood work today.  Hemoglobin increased to 18.4.  He was told to quit smoking. -He does not have any vasomotor symptoms.  No palpable hepatosplenomegaly. -We will see him back in 6 months with repeat blood work.      Orders placed this encounter:  Orders Placed This Encounter  Procedures  . Lactate dehydrogenase  . CBC with Differential/Platelet  . Comprehensive metabolic panel      Derek Jack, MD [place] [phone]

## 2018-11-21 NOTE — Assessment & Plan Note (Signed)
1.  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 Josephine Cables office showed hemoglobin of 19.2, hematocrit of 54.3 with elevated red cell count of 5.88.  White count and platelet count were within normal limits.  Differential showed 10.7% eosinophils with increased absolute eosinophil count. - On review of epic system for previous labs, his hemoglobin was found to be elevated in October 2018 in March 2019.  It has been normal other times. -Patient was diagnosed with seizure disorder and was started on Keppra and Depakote. -I have reviewed a chest x-ray from October 2018 which was showing no evidence of cardiopulmonary disease. - He smoked 2 pack/day for 20 years and quit 11 years ago.  He quit for a month about 4 months ago but started back again.  His smoking about half pack per day now. - He was also negative for other MPN mutations including CALR, Jak exon 12-15. He also has intermittent mild thrombocytopenia over many years. - Bone marrow biopsy on 05/30/2018 showed trilineage hematopoiesis. 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 -His last hemoglobin improved to 17.3 on the day of bone marrow biopsy, as he quit smoking 4 weeks prior. -We reviewed his blood work today.  Hemoglobin increased to 18.4.  He was told to quit smoking. -He does not have any vasomotor symptoms.  No palpable hepatosplenomegaly. -We will see him back in 6 months with repeat blood work.

## 2018-11-21 NOTE — Patient Instructions (Signed)
[facility] at [facility] Discharge Instructions  Follow up in 6 months with labs    Thank you for choosing [facility] at [facility] to provide your oncology and hematology care.  To afford each patient quality time with our provider, please arrive at least 15 minutes before your scheduled appointment time.   If you have a lab appointment with the Cancer Center please come in thru the  Main Entrance and check in at the main information desk  You need to re-schedule your appointment should you arrive 10 or more minutes late.  We strive to give you quality time with our providers, and arriving late affects you and other patients whose appointments are after yours.  Also, if you no show three or more times for appointments you may be dismissed from the clinic at the providers discretion.     Again, thank you for choosing [facility].  Our hope is that these requests will decrease the amount of time that you wait before being seen by our physicians.       _____________________________________________________________  Should you have questions after your visit to [facility], please contact our office at [phone] between the hours of 8:00 a.m. and 4:30 p.m.  Voicemails left after 4:00 p.m. will not be returned until the following business day.  For prescription refill requests, have your pharmacy contact our office and allow 72 hours.    Cancer Center Support Programs:   > Cancer Support Group  2nd Tuesday of the month 1pm-2pm, Journey Room    

## 2018-12-17 ENCOUNTER — Other Ambulatory Visit (HOSPITAL_COMMUNITY): Payer: Self-pay | Admitting: Psychiatry

## 2018-12-17 MED ORDER — MIRTAZAPINE 15 MG PO TABS: 15.0000 mg | ORAL_TABLET | Freq: Every day | ORAL | 0 refills | Status: DC

## 2018-12-17 NOTE — Telephone Encounter (Signed)
Ordered refill for mirtazapine per request. Please contact the patient to make follow up appointment.

## 2018-12-24 IMAGING — DX DG CERVICAL SPINE COMPLETE 4+V
7 series · 7 of 7 positions shown · non-contrast
Comparison: None.

CLINICAL DATA: 51-year-old male with fall.

EXAM:
CERVICAL SPINE - COMPLETE 4+ VIEW

[c-spine lat]
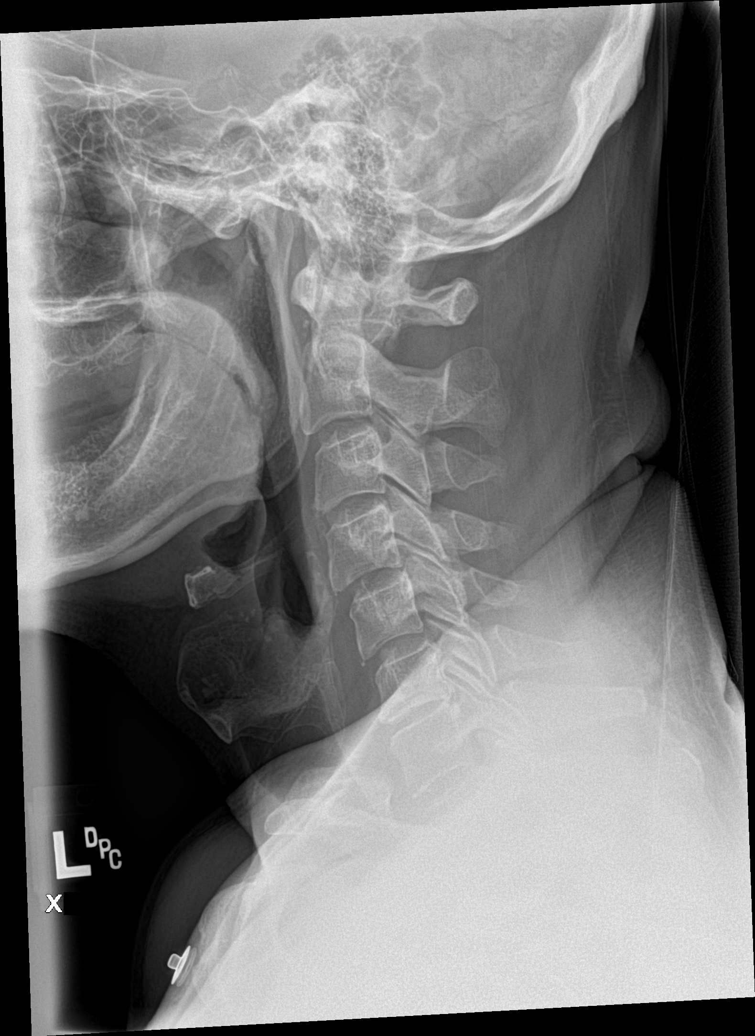

[c-spine obl (1 of 2)]
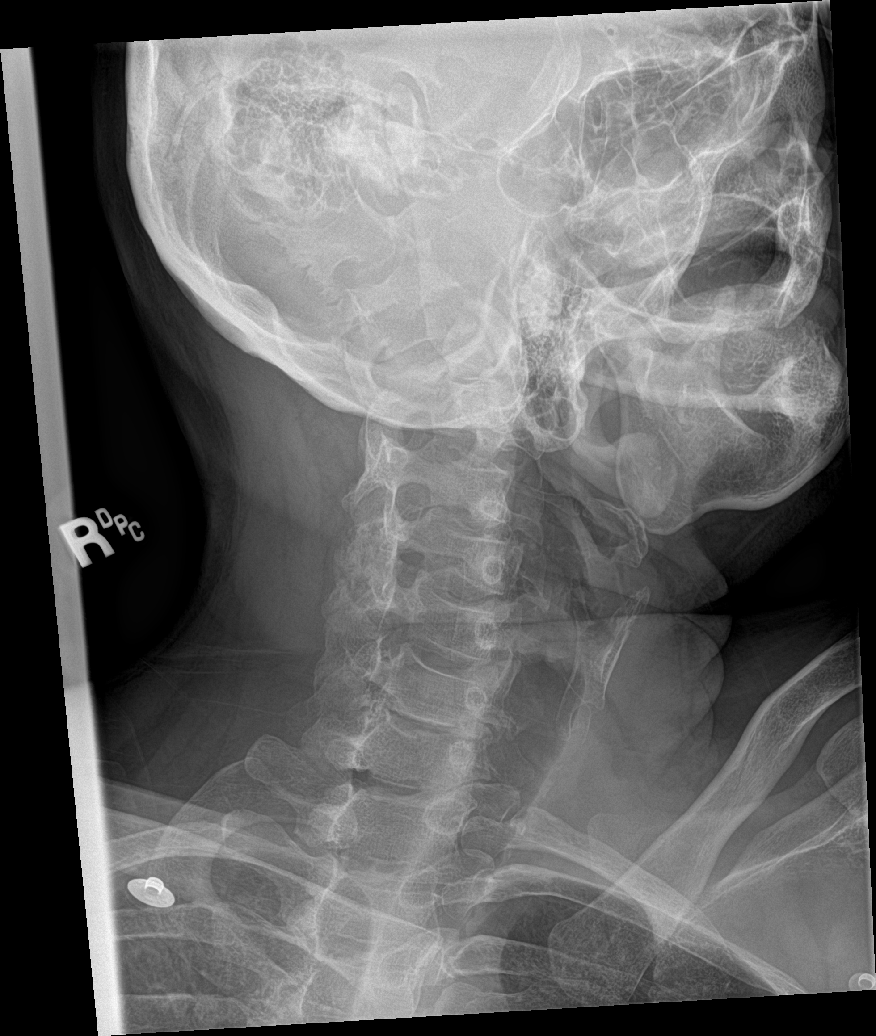

[c-spine obl (2 of 2)]
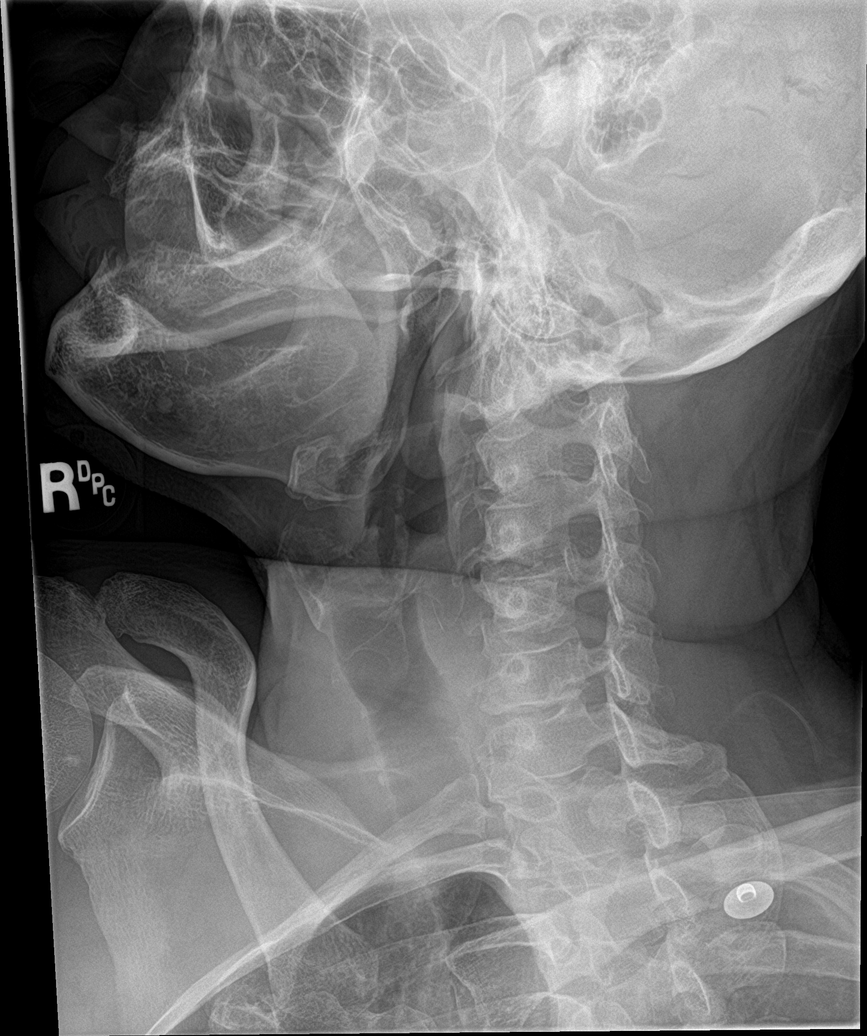

[c-spine ap (1 of 2)]
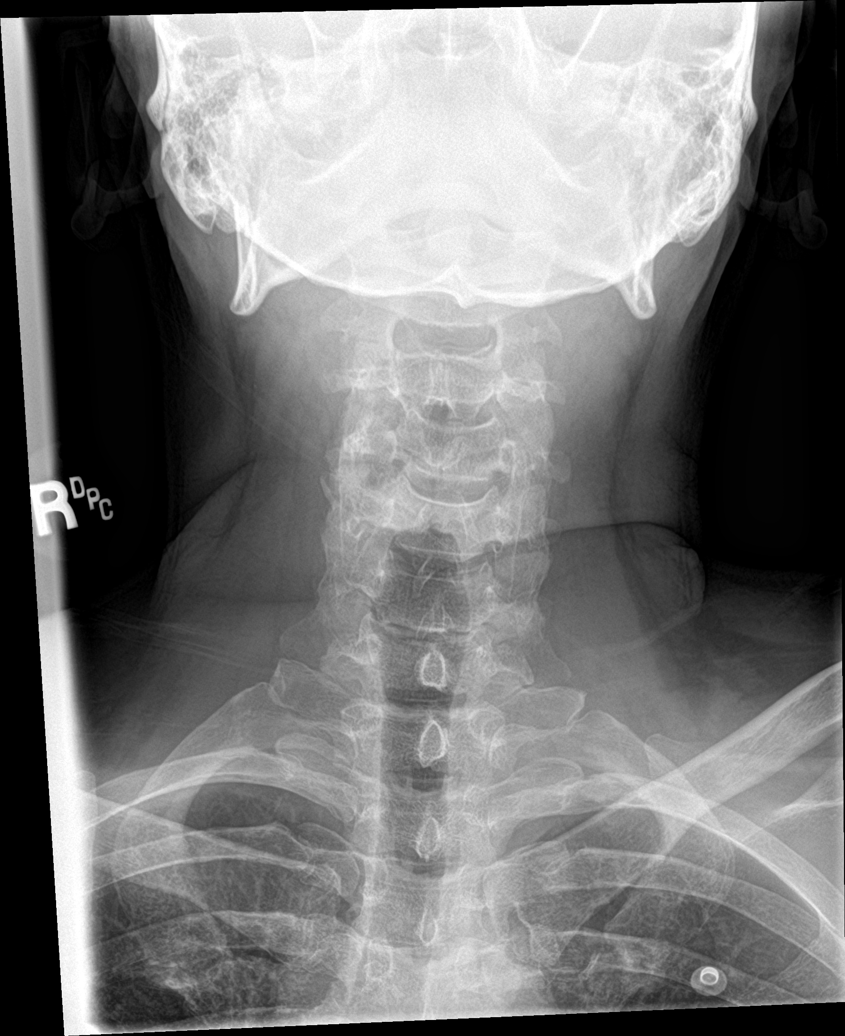

[c-spine open mouth]
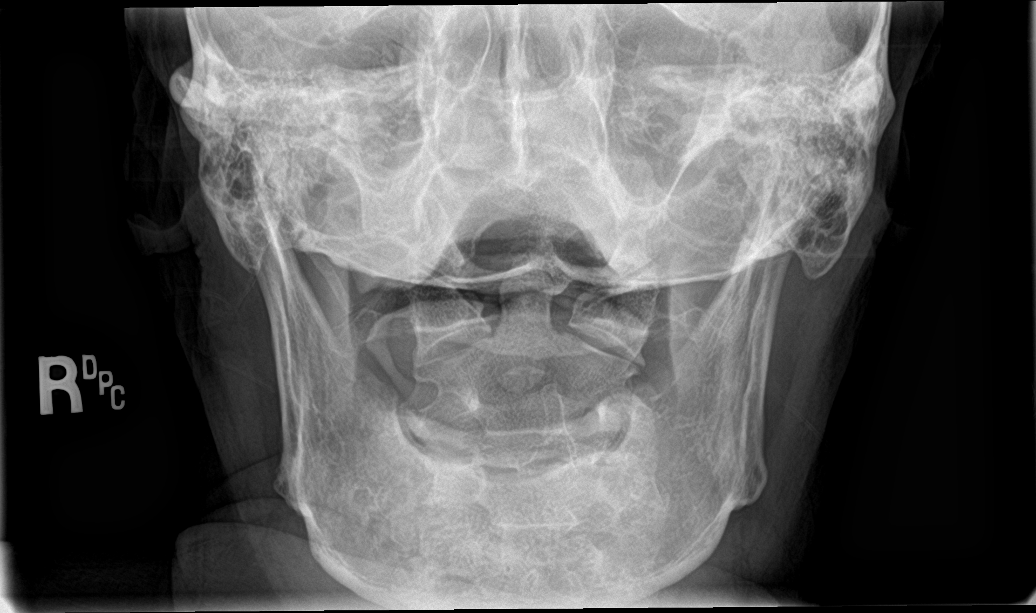

[c-spine swimmers trauma]
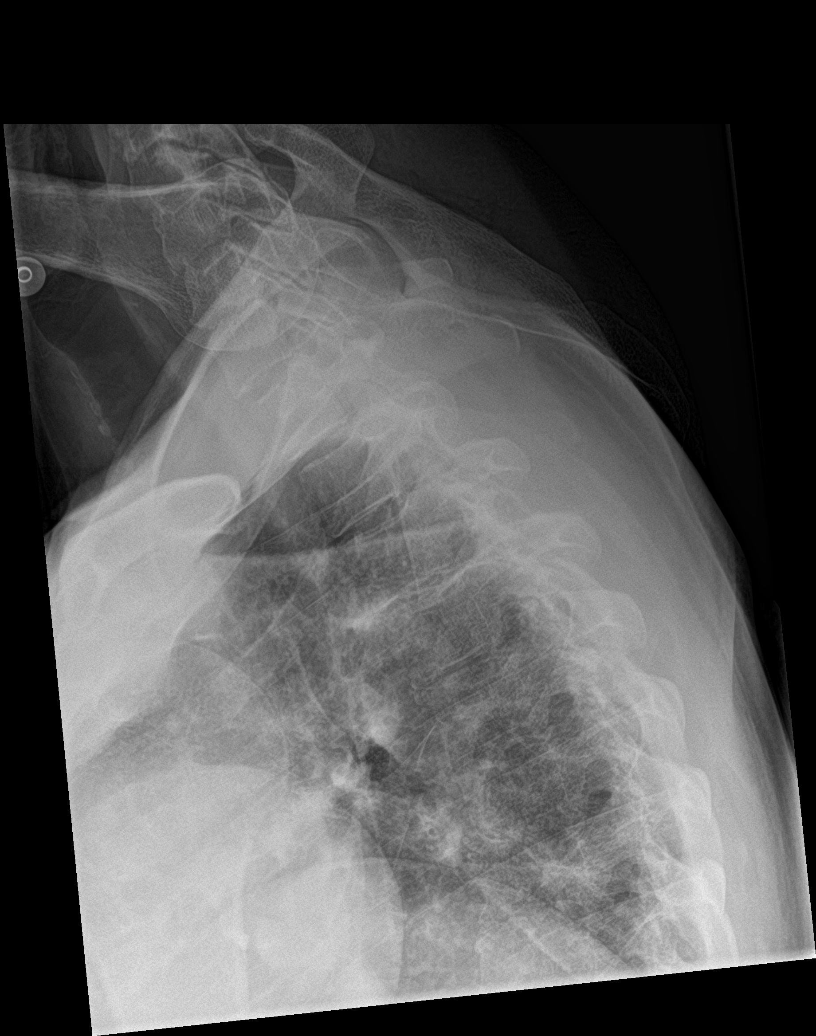

[c-spine ap (2 of 2)]
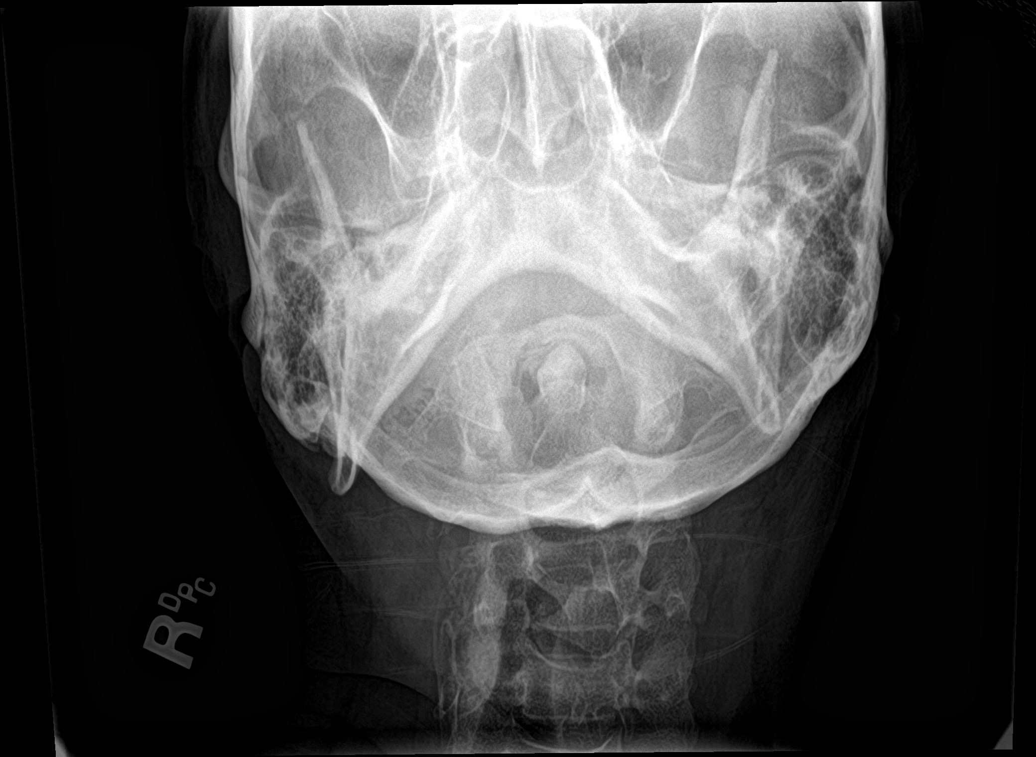

[7 of 7 positions shown; findings below may reference images not displayed]

FINDINGS: There is no acute fracture or subluxation of the cervical spine. The
visualized posterior elements and the odontoid appear intact. There
is anatomic alignment of the lateral masses of C1 and C2. The soft
tissues appear unremarkable.
IMPRESSION: No acute/ traumatic cervical spine pathology by radiograph.

## 2019-02-11 ENCOUNTER — Other Ambulatory Visit: Payer: Self-pay

## 2019-02-11 ENCOUNTER — Telehealth (INDEPENDENT_AMBULATORY_CARE_PROVIDER_SITE_OTHER): Payer: Medicaid Other | Admitting: Neurology

## 2019-02-11 DIAGNOSIS — G40009 Localization-related (focal) (partial) idiopathic epilepsy and epileptic syndromes with seizures of localized onset, not intractable, without status epilepticus: Secondary | ICD-10-CM | POA: Diagnosis not present

## 2019-02-11 MED ORDER — LEVETIRACETAM 750 MG PO TABS: ORAL_TABLET | ORAL | 11 refills | Status: DC

## 2019-02-11 MED ORDER — DIVALPROEX SODIUM ER 500 MG PO TB24: ORAL_TABLET | ORAL | 11 refills | Status: DC

## 2019-02-11 NOTE — Progress Notes (Signed)
Virtual Visit via Video Note The purpose of this virtual visit is to provide medical care while limiting exposure to the novel coronavirus.    Consent was obtained for video visit:  Yes.   Answered questions that patient had about telehealth interaction:  Yes.   I discussed the limitations, risks, security and privacy concerns of performing an evaluation and management service by telemedicine. I also discussed with the patient that there may be a patient responsible charge related to this service. The patient expressed understanding and agreed to proceed.  Pt location: Home Physician Location: office Name of referring provider:  Rosita Fire, MD I connected with Modesto Charon at patients initiation/request on 02/11/2019 at 11:30 AM EDT by video enabled telemedicine application and verified that I am speaking with the correct person using two identifiers. Pt MRN:  [id number] Pt DOB:  1966-04-16 Video Participants:  Modesto Charon   History of Present Illness: The patient was last seen in August 2019. Since then, he contacted our office in October 2019 about bad headaches, tinnitus, lightheadedness, as well as his girlfriend reporting nocturnal seizures. He denied any seizure triggers and was instructed to increase Depakote ER 500mg  to 1 tab in AM, 3 tabs in PM. He is also taking Levetiracetam 750mg  2 tabs BID. He denies any daytime seizures, but reports nocturnal seizures 1-2 times a month. One time he woke up on the floor. He usually wakes up feeling very sore, no tongue bite or incontinence. Last occurrence was 2 weeks ago. He denies any further headaches but still feels lightheaded once in a while. He denies any staring/unresponsive episodes, gaps in time, olfactory/gustatory hallucinations, focal numbness/tingling/weakness, myoclonic jerks. He has fallen out of bed a couple of times. He has noticed a couple of times a week he cannot taste flavored soda or water. He reports doing  better with sleep. He sees a psychiatrist and takes Prozac, but continues to feel snappy and angry which he attributes to the Levetiracetam. He denies any side effects with increase in the Depakote. He works at [employer].   History on Initial Assesment 05/11/2018: This is a 53 year old right-handed man with a history of hyperlipidemia, CAD s/p stent placement, depression, anxiety,  recurrent syncope, presenting for evaluation of seizures. He recalls recurrent syncopal episodes since his mid-20s. No prior warning, he would feel weak, tired, and lost after. He recalls talking to his daughter on the phone 13 years ago, she heard a fall and called EMS to his home, they found him on the floor. He reports having a cardiac workup at that time. He was evaluated by neurologist Dr. [name] in December 2018 after another episode in November 2018 where he was bathing his fiancee's child and he was found on the floor unconscious. MRI brain unremarkable. He had overnight video EEG monitoring at that time which was normal, typical events not captured. He was discharged on Keppra. There seemed to be some improvement in seizure activity. His fiancee reported shaking episodes every night, more on the right side, body stiffness, waking up confused. Depakote was added. He lost his insurance and was off medication for 2-3 months, leading to 3 ER visits in April 2019. He had an eyebrow laceration that was repaired. He was admitted on 02/15/18. Per records he was witnessed to have a brief period of shaking, interactive, following commands during the episode. Routine EEG was normal. He was discharged on Keppra 1500mg  BID and Depakote 1000mg  daily. He was back in the ER on 03/01/18  for another seizure. Depakote level was 47. No medication changes made.   He reports having stages of staring off for a few minutes and feeling lost. This has not occurred in a while. He also has recurrent deja vu episodes occurring 3-4 times a week, he describes this  as doing something but feeling like he did it twice. Last episode was a couple of days ago. This is associated with spinning and a metallic taste, like chewing on copper. Most of the time he sits down, they can last from 10-15 minutes up to a few hours. He denies any recent shaking episodes that his fiancee was reporting, but they have not been sleeping in the same bed due to snoring and kicking/punching in sleep since sleep medication was started. He feels shaking inside his body. His fiancee has not mentioned any staring spells. He denies any epigastric sensation, focal numbness/tingling/weakness, myoclonic jerks. He has migraines that are seasonal, with frontal throbbing pain associated with light/sound sensitivity, no nausea/vomiting. Ibuprofen and sleep helps. No dysphagia, dysarthria, neck pain, bowel/bladder dysfunction. He has low back pain and reports his bones hurt with pain in both legs.   Epilepsy Risk Factors:  He had a normal birth and early development.  There is no history of febrile convulsions, CNS infections such as meningitis/encephalitis, significant traumatic brain injury, neurosurgical procedures, or family history of seizures.   Observations/Objective:  Patient is awake, alert, oriented x 3. No aphasia or dysarthria. Intact fluency and comprehension. Remote and recent memory intact. Able to name and repeat. Cranial nerves: pupils equal, round. Extraocular movements intact with no nystagmus. No facial asymmetry. Motor: moves all extremities symmetrically. No incoordination on finger to nose testing. Gait: narrow-based and steady, able to tandem walk adequately. Negative Romberg test.  Assessment and Plan:   This is a pleasant 53 yo RH man with a history of CAD s/p stent placement, depression, anxiety,  recurrent syncope, who presented for evaluation of seizures. He describes recurrent episodes of metallic taste with deja vu. Concern has been raised about non-epileptic seizures in the  ER, however some of the symptoms he describes are suggestive of focal seizures arising from the temporal lobe. MRI brain and 72-hour EEG normal, however typical events were not captured. He denies any daytime seizures since April 2019, but continues to report nocturnal seizures 1-2 times a month. He is also reporting mood changes on Levetiracetam. We discussed increasing Depakote ER 500mg  to 2 tabs in AM, 3 tabs in PM. After a week, reduce Levetiracetam 750mg  to 1 tab in AM, 2 tabs in PM. He is aware of [place] driving laws to stop driving after a seizure until 6 months seizure-free. He will follow-up in 6 months and knows to call for any changes   Follow Up Instructions:   -I discussed the assessment and treatment plan with the patient. The patient was provided an opportunity to ask questions and all were answered. The patient agreed with the plan and demonstrated an understanding of the instructions.   The patient was advised to call back or seek an in-person evaluation if the symptoms worsen or if the condition fails to improve as anticipated.    Cameron Sprang, MD

## 2019-02-19 ENCOUNTER — Ambulatory Visit: Payer: Self-pay | Admitting: Neurology

## 2019-03-15 ENCOUNTER — Telehealth (INDEPENDENT_AMBULATORY_CARE_PROVIDER_SITE_OTHER): Payer: Medicaid Other | Admitting: Neurology

## 2019-03-15 ENCOUNTER — Other Ambulatory Visit: Payer: Self-pay

## 2019-03-15 DIAGNOSIS — G40009 Localization-related (focal) (partial) idiopathic epilepsy and epileptic syndromes with seizures of localized onset, not intractable, without status epilepticus: Secondary | ICD-10-CM | POA: Diagnosis not present

## 2019-03-15 MED ORDER — OXCARBAZEPINE 300 MG PO TABS: ORAL_TABLET | ORAL | 6 refills | Status: AC

## 2019-03-15 NOTE — Progress Notes (Signed)
Virtual Visit via Video Note The purpose of this virtual visit is to provide medical care while limiting exposure to the novel coronavirus.    Consent was obtained for video visit:  Yes.   Answered questions that patient had about telehealth interaction:  Yes.   I discussed the limitations, risks, security and privacy concerns of performing an evaluation and management service by telemedicine. I also discussed with the patient that there may be a patient responsible charge related to this service. The patient expressed understanding and agreed to proceed.  Pt location: Home Physician Location: office Name of referring provider:  Rosita Fire, MD I connected with Charles Tyler at patients initiation/request on 03/15/2019 at  3:00 PM EDT by video enabled telemedicine application and verified that I am speaking with the correct person using two identifiers. Pt MRN:  [id number] Pt DOB:  [birth year] Video Participants:  Charles Tyler;  [facility] (significant other)   History of Present Illness:  The patient was last seen a month ago for recurrent seizures. His significant other contacted our office 03/13/2019 to report that he woke up completely incoherent for 5-10 minutes. She felt medication changes made last month were not helping and he is having more seizures, requested for an urgent visit since she will be out of town in June and he would be alone. They report a seizure soon after his last visit where he woke up on the floor and hit the filing cabinet. Majority of seizures are nocturnal. He has had tinnitus L>R since the seizure 2 days ago. He was told his left eye was droopy, this has resolved. No other focal weakness. He is taking Depakote ER 500mg  2 tabs in AM, 3 tabs in PM and Keppra 750mg  1 tab in AM, 2 tabs in PM. Charles Tyler is concerned about "Keppra rage."   History on Initial Assesment 05/11/2018: This is a 53 year old right-handed man with a history of hyperlipidemia, CAD  s/p stent placement, depression, anxiety,  recurrent syncope, presenting for evaluation of seizures. He recalls recurrent syncopal episodes since his mid-20s. No prior warning, he would feel weak, tired, and lost after. He recalls talking to his daughter on the phone 13 years ago, she heard a fall and called EMS to his home, they found him on the floor. He reports having a cardiac workup at that time. He was evaluated by neurologist Dr. [name] in December 2018 after another episode in November 2018 where he was bathing his fiancee's child and he was found on the floor unconscious. MRI brain unremarkable. He had overnight video EEG monitoring at that time which was normal, typical events not captured. He was discharged on Keppra. There seemed to be some improvement in seizure activity. His fiancee reported shaking episodes every night, more on the right side, body stiffness, waking up confused. Depakote was added. He lost his insurance and was off medication for 2-3 months, leading to 3 ER visits in April 2019. He had an eyebrow laceration that was repaired. He was admitted on 02/15/18. Per records he was witnessed to have a brief period of shaking, interactive, following commands during the episode. Routine EEG was normal. He was discharged on Keppra 1500mg  BID and Depakote 1000mg  daily. He was back in the ER on 03/01/18 for another seizure. Depakote level was 47. No medication changes made.   He reports having stages of staring off for a few minutes and feeling lost. This has not occurred in a while. He also has recurrent deja  vu episodes occurring 3-4 times a week, he describes this as doing something but feeling like he did it twice. Last episode was a couple of days ago. This is associated with spinning and a metallic taste, like chewing on copper. Most of the time he sits down, they can last from 10-15 minutes up to a few hours. He denies any recent shaking episodes that his fiancee was reporting, but they have  not been sleeping in the same bed due to snoring and kicking/punching in sleep since sleep medication was started. He feels shaking inside his body. His fiancee has not mentioned any staring spells. He denies any epigastric sensation, focal numbness/tingling/weakness, myoclonic jerks. He has migraines that are seasonal, with frontal throbbing pain associated with light/sound sensitivity, no nausea/vomiting. Ibuprofen and sleep helps. No dysphagia, dysarthria, neck pain, bowel/bladder dysfunction. He has low back pain and reports his bones hurt with pain in both legs.   Epilepsy Risk Factors:  He had a normal birth and early development.  There is no history of febrile convulsions, CNS infections such as meningitis/encephalitis, significant traumatic brain injury, neurosurgical procedures, or family history of seizures.   Observations/Objective:   GEN:  The patient appears stated age and is in NAD.  Neurological examination: Patient is awake, alert, oriented x 3. No aphasia or dysarthria. Intact fluency and comprehension. Remote and recent memory intact. Able to name and repeat. Cranial nerves: Extraocular movements intact with no nystagmus. No facial asymmetry. Motor: moves all extremities symmetrically, at least anti-gravity x 4. No incoordination on finger to nose testing. Gait: narrow-based and steady, able to tandem walk adequately. Negative Romberg test.  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 He has nocturnal seizures as well. MRI brain and 72-hour EEG normal, however typical events were not captured. They report an increase in seizures with recent medication changes, we discussed starting oxcarbazepine 150mg  BID x 1 week, then 300mg  BID x 1 week, then increase to 600mg  BID. Side effects were  discussed. Continue Depakote ER 500mg  to 2 tabs in AM, 3 tabs in PM and Levetiracetam 750mg  to 1 tab in AM, 2 tabs in PM for now, we may plan to wean off LEV in the future since it is causing rage issues. We again discussed [place] driving laws to stop driving after a seizure until 6 months seizure-free. He will follow-up in 6 months and knows to call for any changes.   Follow Up Instructions:   -I discussed the assessment and treatment plan with the patient. The patient was provided an opportunity to ask questions and all were answered. The patient agreed with the plan and demonstrated an understanding of the instructions.   The patient was advised to call back or seek an in-person evaluation if the symptoms worsen or if the condition fails to improve as anticipated.      Cameron Sprang, MD

## 2019-05-20 ENCOUNTER — Other Ambulatory Visit: Payer: Self-pay

## 2019-05-20 ENCOUNTER — Inpatient Hospital Stay (HOSPITAL_COMMUNITY): Payer: Medicaid Other | Attending: Hematology

## 2019-05-20 DIAGNOSIS — D751 Secondary polycythemia: Secondary | ICD-10-CM | POA: Insufficient documentation

## 2019-05-20 DIAGNOSIS — F17210 Nicotine dependence, cigarettes, uncomplicated: Secondary | ICD-10-CM | POA: Insufficient documentation

## 2019-05-20 DIAGNOSIS — D229 Melanocytic nevi, unspecified: Secondary | ICD-10-CM | POA: Insufficient documentation

## 2019-05-20 LAB — CBC WITH DIFFERENTIAL/PLATELET
Abs Immature Granulocytes: 0.02 10*3/uL (ref 0.00–0.07)
Basophils Absolute: 0 10*3/uL (ref 0.0–0.1)
Basophils Relative: 1 %
Eosinophils Absolute: 0.5 10*3/uL (ref 0.0–0.5)
Eosinophils Relative: 7 %
HCT: 49.8 % (ref 39.0–52.0)
Hemoglobin: 17.3 g/dL — ABNORMAL HIGH (ref 13.0–17.0)
Immature Granulocytes: 0 %
Lymphocytes Relative: 20 %
Lymphs Abs: 1.5 10*3/uL (ref 0.7–4.0)
MCH: 32.7 pg (ref 26.0–34.0)
MCHC: 34.7 g/dL (ref 30.0–36.0)
MCV: 94.1 fL (ref 80.0–100.0)
Monocytes Absolute: 0.5 10*3/uL (ref 0.1–1.0)
Monocytes Relative: 7 %
Neutro Abs: 4.7 10*3/uL (ref 1.7–7.7)
Neutrophils Relative %: 65 %
Platelets: 129 10*3/uL — ABNORMAL LOW (ref 150–400)
RBC: 5.29 MIL/uL (ref 4.22–5.81)
RDW: 13 % (ref 11.5–15.5)
WBC: 7.2 10*3/uL (ref 4.0–10.5)
nRBC: 0 % (ref 0.0–0.2)

## 2019-05-20 LAB — COMPREHENSIVE METABOLIC PANEL WITH GFR
ALT: 26 U/L (ref 0–44)
AST: 27 U/L (ref 15–41)
Albumin: 3.8 g/dL (ref 3.5–5.0)
Alkaline Phosphatase: 49 U/L (ref 38–126)
Anion gap: 10 (ref 5–15)
BUN: 13 mg/dL (ref 6–20)
CO2: 25 mmol/L (ref 22–32)
Calcium: 9.2 mg/dL (ref 8.9–10.3)
Chloride: 104 mmol/L (ref 98–111)
Creatinine, Ser: 0.69 mg/dL (ref 0.61–1.24)
GFR calc Af Amer: >60 mL/min
GFR calc non Af Amer: >60 mL/min
Glucose, Bld: 84 mg/dL (ref 70–99)
Potassium: 4.2 mmol/L (ref 3.5–5.1)
Sodium: 139 mmol/L (ref 135–145)
Total Bilirubin: 0.5 mg/dL (ref 0.3–1.2)
Total Protein: 6.4 g/dL — ABNORMAL LOW (ref 6.5–8.1)

## 2019-05-20 LAB — LACTATE DEHYDROGENASE: LDH: 149 U/L (ref 98–192)

## 2019-05-22 ENCOUNTER — Inpatient Hospital Stay (HOSPITAL_BASED_OUTPATIENT_CLINIC_OR_DEPARTMENT_OTHER): Payer: Medicaid Other | Admitting: Hematology

## 2019-05-22 ENCOUNTER — Other Ambulatory Visit: Payer: Self-pay

## 2019-05-22 DIAGNOSIS — F17210 Nicotine dependence, cigarettes, uncomplicated: Secondary | ICD-10-CM | POA: Diagnosis not present

## 2019-05-22 DIAGNOSIS — D229 Melanocytic nevi, unspecified: Secondary | ICD-10-CM

## 2019-05-22 DIAGNOSIS — D751 Secondary polycythemia: Secondary | ICD-10-CM

## 2019-05-22 NOTE — Assessment & Plan Note (Signed)
1.  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 Josephine Cables office showed hemoglobin of 19.2, hematocrit of 54.3 with elevated red cell count of 5.88.  White count and platelet count were within normal limits.  Differential showed 10.7% eosinophils with increased absolute eosinophil count. - On review of epic system for previous labs, his hemoglobin was found to be elevated in October 2018 in March 2019.  It has been normal other times. -Patient was diagnosed with seizure disorder and was started on Keppra and Depakote. -I have reviewed a chest x-ray from October 2018 which was showing no evidence of cardiopulmonary disease. - He smoked 2 pack/day for 20 years and quit 11 years ago.  He quit for a month about 4 months ago but started back again.  His smoking about half pack per day now. - He was also negative for other MPN mutations including CALR, Jak exon 12-15. He also has intermittent mild thrombocytopenia over many years. - Bone marrow biopsy on 05/30/2018 showed trilineage hematopoiesis. 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 - Hemoglobin today 17.3, hematocrit 49.8.  This has improved.  Improvement can be attributed to tobacco cessation.  He denies any vasomotor symptoms or pruritus.  No phlebotomy is indicated at this time. -He will return to clinic in 6 months.

## 2019-05-22 NOTE — Progress Notes (Signed)
[place], [place] [address]   CLINIC:  Medical Oncology/Hematology  PCP:  Rosita Fire, MD [place] [address] [phone]   REASON FOR VISIT:  Follow-up for erythocytosis  CURRENT THERAPY: Clinical surveillance   INTERVAL HISTORY:  Mr. Hietala 53 y.o. male presents today for follow up. Reports overall doing well. He denies any significant fatigue. He reports he has a 84 old son. He states he quit smoking once his son was born.  He denies any pruritus or flushing of the skin.  Denies any fevers, chills, night sweats.  Denies any lymphadenopathy.  Denies any changes to his health history since his last visit.  He does have a mole he is concerned about.   REVIEW OF SYSTEMS:  Review of Systems  All other systems reviewed and are negative.    PAST MEDICAL/SURGICAL HISTORY:  Past Medical History:  Diagnosis Date  . Coronary artery disease 08/2017   Stents placed in [place] in 2016. Nonobstructive lesions on 10/18 angiogram.  . Depression with anxiety   . History of cardiac catheterization    Done in [place] - details not clear  . Hyperlipidemia   . Migraine   . Seizures ([place])    Past Surgical History:  Procedure Laterality Date  . CHOLECYSTECTOMY    . LEFT HEART CATH AND CORONARY ANGIOGRAPHY N/A 08/23/2017   Procedure: LEFT HEART CATH AND CORONARY ANGIOGRAPHY;  Surgeon: Belva Crome, MD;  Location: [facility];  Service: Cardiovascular;  Laterality: N/A;     SOCIAL HISTORY:  Social History   Socioeconomic History  . Marital status: Significant Other    Spouse name: Not on file  . Number of children: 4  . Years of education: 2 years college  . Highest education level: Not on file  Occupational History  . Occupation: Unemployed  Social Needs  . Financial resource strain: Not on file  . Food insecurity    Worry: Not on file    Inability: Not on file  . Transportation  needs    Medical: Not on file    Non-medical: Not on file  Tobacco Use  . Smoking status: Current Some Day Smoker    Packs/day: 1.00    Types: Cigarettes    Last attempt to quit: 01/05/2018    Years since quitting: 1.3  . Smokeless tobacco: Never Used  . Tobacco comment: off and on   Substance and Sexual Activity  . Alcohol use: Not Currently    Comment: rarely, 09-14-2017 per pt occas  . Drug use: No    Comment: 09-14-2017 per pt stopped 30 years ago - Cocaine, Marijuana and speed   . Sexual activity: Yes    Birth control/protection: None  Lifestyle  . Physical activity    Days per week: Not on file    Minutes per session: Not on file  . Stress: Not on file  Relationships  . Social Herbalist on phone: Not on file    Gets together: Not on file    Attends religious service: Not on file    Active member of club or organization: Not on file    Attends meetings of clubs or organizations: Not on file    Relationship status: Not on file  . Intimate partner violence    Fear of current or ex partner: Not on file    Emotionally abused: Not on file    Physically abused: Not on  file    Forced sexual activity: Not on file  Other Topics Concern  . Not on file  Social History Narrative   Lives at home with his fiancee.   Right-handed.   2 cups caffeine per day.    FAMILY HISTORY:  Family History  Problem Relation Age of Onset  . CAD Father        Premature CAD status post CABG  . Heart attack Father   . COPD Father   . Heart disease Father   . CAD Paternal Grandfather        Premature CAD status post CABG  . Heart disease Paternal Grandfather   . Alcohol abuse Maternal Grandfather   . Heart disease Maternal Grandfather   . Alcohol abuse Maternal Grandmother   . COPD Mother   . Hyperlipidemia Sister   . Asthma Sister   . Asthma Brother   . Allergies Sister   . Asthma Sister   . Multiple sclerosis Daughter   . Seizures Daughter     CURRENT MEDICATIONS:   Outpatient Encounter Medications as of 05/22/2019  Medication Sig  . divalproex (DEPAKOTE ER) 500 MG 24 hr tablet Take 2 tabs in AM, 3 tabs in PM  . FLUoxetine (PROZAC) 40 MG capsule Take 1 capsule (40 mg total) by mouth daily.  Marland Kitchen levETIRAcetam (KEPPRA) 750 MG tablet Take 1 tab in AM, 2 tabs in PM  . Oxcarbazepine (TRILEPTAL) 300 MG tablet Take 1/2 tablet twice a day for 1 week, then increase to 1 tablet twice a day for 1 week, then increase to 2 tablets twice a day and continue  . atorvastatin (LIPITOR) 40 MG tablet Take 40 mg by mouth daily.  . diphenhydrAMINE (BENADRYL) 25 mg capsule Take 25 mg by mouth every 6 (six) hours as needed for itching.   No facility-administered encounter medications on file as of 05/22/2019.     ALLERGIES:  Allergies  Allergen Reactions  . Aspirin Nausea And Vomiting    325 mg   . Other Hives    Walnuts     PHYSICAL EXAM:  ECOG Performance status: 1  Vitals:   05/22/19 0925  BP: 115/64  Pulse: 62  Resp: 18  Temp: 97.8 F (36.6 C)  SpO2: 96%   Filed Weights   05/22/19 0925  Weight: 170 lb 14.4 oz (77.5 kg)    Physical Exam Constitutional:      Appearance: Normal appearance. He is obese.  HENT:     Head: Normocephalic.     Nose: Nose normal. No congestion.     Mouth/Throat:     Mouth: Mucous membranes are moist.     Pharynx: Oropharynx is clear.  Eyes:     Extraocular Movements: Extraocular movements intact.     Conjunctiva/sclera: Conjunctivae normal.  Neck:     Musculoskeletal: Normal range of motion.  Cardiovascular:     Rate and Rhythm: Normal rate and regular rhythm.     Pulses: Normal pulses.     Heart sounds: Normal heart sounds.  Pulmonary:     Effort: Pulmonary effort is normal.     Breath sounds: Normal breath sounds.  Abdominal:     General: Bowel sounds are normal.     Palpations: Abdomen is soft.  Musculoskeletal: Normal range of motion.  Skin:    General: Skin is warm and dry.  Neurological:     General: No  focal deficit present.     Mental Status: He is alert and oriented to person, place,  and time.  Psychiatric:        Mood and Affect: Mood normal.        Behavior: Behavior normal.        Thought Content: Thought content normal.        Judgment: Judgment normal.      LABORATORY DATA:  I have reviewed the labs as listed.  CBC    Component Value Date/Time   WBC 7.2 05/20/2019 1114   RBC 5.29 05/20/2019 1114   HGB 17.3 (H) 05/20/2019 1114   HCT 49.8 05/20/2019 1114   PLT 129 (L) 05/20/2019 1114   MCV 94.1 05/20/2019 1114   MCH 32.7 05/20/2019 1114   MCHC 34.7 05/20/2019 1114   RDW 13.0 05/20/2019 1114   LYMPHSABS 1.5 05/20/2019 1114   MONOABS 0.5 05/20/2019 1114   EOSABS 0.5 05/20/2019 1114   BASOSABS 0.0 05/20/2019 1114   CMP Latest Ref Rng & Units 05/20/2019 11/12/2018 03/01/2018  Glucose 70 - 99 mg/dL 84 90 86  BUN 6 - 20 mg/dL '13 19 12  '$ Creatinine 0.61 - 1.24 mg/dL 0.69 0.79 0.90  Sodium 135 - 145 mmol/L 139 137 139  Potassium 3.5 - 5.1 mmol/L 4.2 4.3 4.0  Chloride 98 - 111 mmol/L 104 106 105  CO2 22 - 32 mmol/L '25 25 26  '$ Calcium 8.9 - 10.3 mg/dL 9.2 9.2 8.3(L)  Total Protein 6.5 - 8.1 g/dL 6.4(L) 7.0 5.7(L)  Total Bilirubin 0.3 - 1.2 mg/dL 0.5 0.5 0.5  Alkaline Phos 38 - 126 U/L 49 56 46  AST 15 - 41 U/L '27 29 17  '$ ALT 0 - 44 U/L 26 53(H) 22       ASSESSMENT & PLAN:   Erythrocytosis 1.  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 Josephine Cables office showed hemoglobin of 19.2, hematocrit of 54.3 with elevated red cell count of 5.88.  White count and platelet count were within normal limits.  Differential showed 10.7% eosinophils with increased absolute eosinophil count. - On review of epic system for previous labs, his hemoglobin was found to be elevated in October 2018 in March 2019.  It has been normal other times. -Patient was diagnosed with seizure disorder and was started on Keppra and Depakote. -I have reviewed a chest x-ray from October 2018 which  was showing no evidence of cardiopulmonary disease. - He smoked 2 pack/day for 20 years and quit 11 years ago.  He quit for a month about 4 months ago but started back again.  His smoking about half pack per day now. - He was also negative for other MPN mutations including CALR, Jak exon 12-15. He also has intermittent mild thrombocytopenia over many years. - Bone marrow biopsy on 05/30/2018 showed trilineage hematopoiesis. 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 - Hemoglobin today 17.3, hematocrit 49.8.  This has improved.  Improvement can be attributed to tobacco cessation.  He denies any vasomotor symptoms or pruritus.  No phlebotomy is indicated at this time. -He will return to clinic in 6 months.      Orders placed this encounter:  Orders Placed This Encounter  Procedures  . CBC with Differential  . Comprehensive metabolic panel  . Lactate dehydrogenase     Roger Shelter, [place] [phone]

## 2019-05-27 ENCOUNTER — Encounter: Payer: Self-pay | Admitting: Internal Medicine

## 2019-06-28 ENCOUNTER — Encounter (HOSPITAL_COMMUNITY): Payer: Self-pay

## 2019-06-28 ENCOUNTER — Observation Stay (HOSPITAL_COMMUNITY)
Admission: EM | Admit: 2019-06-28 | Discharge: 2019-06-29 | Disposition: A | Discharge status: Home / Self Care | Payer: Medicaid Other | Attending: Internal Medicine | Admitting: Internal Medicine

## 2019-06-28 ENCOUNTER — Emergency Department (HOSPITAL_COMMUNITY): Payer: Medicaid Other

## 2019-06-28 DIAGNOSIS — E785 Hyperlipidemia, unspecified: Secondary | ICD-10-CM | POA: Diagnosis present

## 2019-06-28 DIAGNOSIS — F418 Other specified anxiety disorders: Secondary | ICD-10-CM | POA: Diagnosis not present

## 2019-06-28 DIAGNOSIS — G43909 Migraine, unspecified, not intractable, without status migrainosus: Secondary | ICD-10-CM | POA: Insufficient documentation

## 2019-06-28 DIAGNOSIS — Z79899 Other long term (current) drug therapy: Secondary | ICD-10-CM | POA: Insufficient documentation

## 2019-06-28 DIAGNOSIS — I2510 Atherosclerotic heart disease of native coronary artery without angina pectoris: Secondary | ICD-10-CM | POA: Diagnosis not present

## 2019-06-28 DIAGNOSIS — R569 Unspecified convulsions: Principal | ICD-10-CM

## 2019-06-28 DIAGNOSIS — F17210 Nicotine dependence, cigarettes, uncomplicated: Secondary | ICD-10-CM | POA: Diagnosis not present

## 2019-06-28 DIAGNOSIS — Z720 Tobacco use: Secondary | ICD-10-CM | POA: Diagnosis present

## 2019-06-28 DIAGNOSIS — Z20828 Contact with and (suspected) exposure to other viral communicable diseases: Secondary | ICD-10-CM | POA: Diagnosis not present

## 2019-06-28 LAB — COMPREHENSIVE METABOLIC PANEL
ALT: 15 U/L (ref 0–44)
AST: 15 U/L (ref 15–41)
Albumin: 3.5 g/dL (ref 3.5–5.0)
Alkaline Phosphatase: 47 U/L (ref 38–126)
Anion gap: 5 (ref 5–15)
BUN: 14 mg/dL (ref 6–20)
CO2: 24 mmol/L (ref 22–32)
Calcium: 8.4 mg/dL — ABNORMAL LOW (ref 8.9–10.3)
Chloride: 107 mmol/L (ref 98–111)
Creatinine, Ser: 0.67 mg/dL (ref 0.61–1.24)
GFR calc Af Amer: >60 mL/min (ref >60)
GFR calc non Af Amer: >60 mL/min (ref >60)
Glucose, Bld: 84 mg/dL (ref 70–99)
Potassium: 4 mmol/L (ref 3.5–5.1)
Sodium: 136 mmol/L (ref 135–145)
Total Bilirubin: 0.6 mg/dL (ref 0.3–1.2)
Total Protein: 6 g/dL — ABNORMAL LOW (ref 6.5–8.1)

## 2019-06-28 LAB — CBC WITH DIFFERENTIAL/PLATELET
Abs Immature Granulocytes: 0.02 10*3/uL (ref 0.00–0.07)
Basophils Absolute: 0 10*3/uL (ref 0.0–0.1)
Basophils Relative: 0 %
Eosinophils Absolute: 0.5 10*3/uL (ref 0.0–0.5)
Eosinophils Relative: 7 %
HCT: 45.9 % (ref 39.0–52.0)
Hemoglobin: 15.9 g/dL (ref 13.0–17.0)
Immature Granulocytes: 0 %
Lymphocytes Relative: 22 %
Lymphs Abs: 1.5 10*3/uL (ref 0.7–4.0)
MCH: 33.3 pg (ref 26.0–34.0)
MCHC: 34.6 g/dL (ref 30.0–36.0)
MCV: 96.2 fL (ref 80.0–100.0)
Monocytes Absolute: 0.7 10*3/uL (ref 0.1–1.0)
Monocytes Relative: 9 %
Neutro Abs: 4.3 10*3/uL (ref 1.7–7.7)
Neutrophils Relative %: 62 %
Platelets: 146 10*3/uL — ABNORMAL LOW (ref 150–400)
RBC: 4.77 MIL/uL (ref 4.22–5.81)
RDW: 13.7 % (ref 11.5–15.5)
WBC: 7 10*3/uL (ref 4.0–10.5)
nRBC: 0 % (ref 0.0–0.2)

## 2019-06-28 LAB — VALPROIC ACID LEVEL: Valproic Acid Lvl: 30 ug/mL — ABNORMAL LOW (ref 50.0–100.0)

## 2019-06-28 LAB — MAGNESIUM: Magnesium: 2.2 mg/dL (ref 1.7–2.4)

## 2019-06-28 LAB — SARS CORONAVIRUS 2 BY RT PCR (HOSPITAL ORDER, PERFORMED IN ~~LOC~~ HOSPITAL LAB): SARS Coronavirus 2: NEGATIVE

## 2019-06-28 LAB — PHOSPHORUS: Phosphorus: 3.4 mg/dL (ref 2.5–4.6)

## 2019-06-28 MED ORDER — LEVETIRACETAM 500 MG PO TABS
750.0000 mg | ORAL_TABLET | Freq: Every day | ORAL | Status: DC
  Administered 2019-06-29: 750 mg via ORAL
  Filled 2019-06-28: qty 1

## 2019-06-28 MED ORDER — ATORVASTATIN CALCIUM 40 MG PO TABS: 40.0000 mg | ORAL_TABLET | Freq: Every day | ORAL | Status: DC

## 2019-06-28 MED ORDER — HEPARIN SODIUM (PORCINE) 5000 UNIT/ML IJ SOLN
5000.0000 [IU] | Freq: Three times a day (TID) | INTRAMUSCULAR | Status: DC
  Administered 2019-06-28 – 2019-06-29 (×2): 5000 [IU] via SUBCUTANEOUS
  Filled 2019-06-28 (×2): qty 1

## 2019-06-28 MED ORDER — OXCARBAZEPINE 300 MG PO TABS: 300.0000 mg | ORAL_TABLET | Freq: Two times a day (BID) | ORAL | Status: DC

## 2019-06-28 MED ORDER — DIPHENHYDRAMINE HCL 25 MG PO CAPS: 25.0000 mg | ORAL_CAPSULE | Freq: Four times a day (QID) | ORAL | Status: DC | PRN

## 2019-06-28 MED ORDER — ACETAMINOPHEN 325 MG PO TABS: 650.0000 mg | ORAL_TABLET | Freq: Four times a day (QID) | ORAL | Status: DC | PRN

## 2019-06-28 MED ORDER — DIVALPROEX SODIUM ER 500 MG PO TB24
1500.0000 mg | ORAL_TABLET | Freq: Two times a day (BID) | ORAL | Status: DC
  Administered 2019-06-28 – 2019-06-29 (×2): 1500 mg via ORAL
  Filled 2019-06-28 (×2): qty 3

## 2019-06-28 MED ORDER — LEVETIRACETAM 500 MG PO TABS
1500.0000 mg | ORAL_TABLET | Freq: Every day | ORAL | Status: DC
  Administered 2019-06-28: 1500 mg via ORAL
  Filled 2019-06-28: qty 3

## 2019-06-28 MED ORDER — SODIUM CHLORIDE 0.9 % IV SOLN
INTRAVENOUS | Status: AC
  Administered 2019-06-28: 19:00:00 via INTRAVENOUS

## 2019-06-28 MED ORDER — ACETAMINOPHEN 650 MG RE SUPP: 650.0000 mg | Freq: Four times a day (QID) | RECTAL | Status: DC | PRN

## 2019-06-28 MED ORDER — OXCARBAZEPINE 300 MG PO TABS
600.0000 mg | ORAL_TABLET | Freq: Two times a day (BID) | ORAL | Status: DC
  Administered 2019-06-28 – 2019-06-29 (×2): 600 mg via ORAL
  Filled 2019-06-28 (×7): qty 2

## 2019-06-28 MED ORDER — LEVETIRACETAM IN NACL 1000 MG/100ML IV SOLN: 1000.0000 mg | Freq: Once | INTRAVENOUS | Status: DC

## 2019-06-28 MED ORDER — VALPROATE SODIUM 500 MG/5ML IV SOLN
500.0000 mg | Freq: Once | INTRAVENOUS | Status: AC
  Administered 2019-06-28: 15:00:00 500 mg via INTRAVENOUS
  Filled 2019-06-28: qty 5

## 2019-06-28 MED ORDER — FLUOXETINE HCL 40 MG PO CAPS: 40.0000 mg | ORAL_CAPSULE | Freq: Every day | ORAL | Status: DC

## 2019-06-28 MED ORDER — DIVALPROEX SODIUM ER 500 MG PO TB24: 1000.0000 mg | ORAL_TABLET | Freq: Every day | ORAL | Status: DC

## 2019-06-28 MED ORDER — NICOTINE 21 MG/24HR TD PT24
21.0000 mg | MEDICATED_PATCH | Freq: Every day | TRANSDERMAL | Status: DC
  Administered 2019-06-28: 21 mg via TRANSDERMAL
  Filled 2019-06-28 (×2): qty 1

## 2019-06-28 MED ORDER — ONDANSETRON HCL 4 MG/2ML IJ SOLN: 4.0000 mg | Freq: Four times a day (QID) | INTRAMUSCULAR | Status: DC | PRN

## 2019-06-28 MED ORDER — DIVALPROEX SODIUM ER 500 MG PO TB24: 1500.0000 mg | ORAL_TABLET | Freq: Every day | ORAL | Status: DC

## 2019-06-28 MED ORDER — DIVALPROEX SODIUM ER 500 MG PO TB24: 1000.0000 mg | ORAL_TABLET | Freq: Two times a day (BID) | ORAL | Status: DC

## 2019-06-28 MED ORDER — LEVETIRACETAM IN NACL 500 MG/100ML IV SOLN
500.0000 mg | Freq: Once | INTRAVENOUS | Status: AC
  Administered 2019-06-28: 500 mg via INTRAVENOUS
  Filled 2019-06-28: qty 100

## 2019-06-28 MED ORDER — ONDANSETRON HCL 4 MG PO TABS: 4.0000 mg | ORAL_TABLET | Freq: Four times a day (QID) | ORAL | Status: DC | PRN

## 2019-06-28 NOTE — Consult Note (Signed)
[place] A. Merlene Laughter, MD     [email or web address]          Charles Tyler is an 53 y.o. male.   ASSESSMENT/PLAN: 1.  Resolved status epilepticus.  Patient will be kept overnight for observation.  I recommend we increase his Keppra to 1500 mg twice a day.  The Depakote will also be increased to 1500 mg twice a day.  If the patient is back to baseline, he can be discharged tomorrow and follow-up with his regular neurologist.  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    The patient is a 53 year old white male who has a history of epilepsy.  He is on 3 medications.  He presented with the multiple seizures back-to-back consist of generalized tonic-clonic activity and amnesia.  The patient was eventually given Versed and additional doses of Depakote and Keppra which aborted his seizures.  Unfortunately, the patient has been quite drowsy and encephalopathic postictally.  I was able to get a conversation from the patient however along with his girlfriend on the phone.  He apparently has most of his episodes at night and on awakening in the morning.  I did question them repeatedly about compliance with his medications and they indicate that he has not been compliant with all medications include the Depakote which has been subtherapeutic in check and all levels done for the past 2 years.  In reviewing to his primary neurologist notes, it appears that they may also been issues with the [place].  The patient reports that he is quite fatigued and tired.  He complains of his muscles being sore and he is amnestic to the event but there are no reports of oral trauma or urine incontinence.  He did hit his head about a week ago with 1 of his events.  The girlfriend tells me he has been having these spells a few times a week.  Again, and occurring the morning  and on awakening.  He does not report focal neurological deficit at this time.  There are no reports of dyspnea, chest pain or other symptoms other than above.  The review of systems otherwise negative.  GENERAL: The patient appears fatigued and drowsy but in no acute distress.  HEENT: Neck is supple no trauma appreciated.  Specifically, no tongue trauma.  ABDOMEN: soft  EXTREMITIES: No edema   BACK: Normal  SKIN: Normal by inspection.    MENTAL STATUS: He is somewhat drowsy and groggy but mostly lucid.  CRANIAL NERVES: Pupils are equal, round and reactive to light and accomodation; extra ocular movements are full, there is no significant nystagmus; visual fields are full; upper and lower facial muscles are normal in strength and symmetric, there is no flattening of the nasolabial folds; tongue is midline; uvula is midline; shoulder elevation is normal.  MOTOR: Normal tone, bulk and strength; no pronator drift.  COORDINATION: Left finger to nose is normal, right finger to nose is normal, No rest tremor; no intention tremor; no postural tremor; no bradykinesia.  REFLEXES: Deep tendon reflexes are symmetrical and normal.  SENSATION: Normal to light touch, temperature, and pain.          [facility] 03-2019 History of Present Illness:  The patient was last seen a month ago for recurrent seizures. His significant other contacted our office 03/13/2019 to report that he woke up completely incoherent for 5-10 minutes.  She felt medication changes made last month were not helping and he is having more seizures, requested for an urgent visit since she will be out of town in June and he would be alone. They report a seizure soon after his last visit where he woke up on the floor and hit the filing cabinet. Majority of seizures are nocturnal. He has had tinnitus L>R since the seizure 2 days ago. He was told his left eye was droopy, this has resolved. No other focal weakness. He is taking Depakote ER  500mg  2 tabs in AM, 3 tabs in PM and Keppra 750mg  1 tab in AM, 2 tabs in PM. [name] is concerned about "Keppra rage."   History on Initial Assesment 05/11/2018: This is a 53 year old right-handed man with a history of hyperlipidemia, CAD s/p stent placement, depression, anxiety, recurrent syncope, presenting for evaluation of seizures. He recalls recurrent syncopal episodes since his mid-20s. No prior warning, he would feel weak, tired, and lost after. He recalls talking to his daughter on the phone 13 years ago, she heard a fall and called EMS to his home, they found him on the floor. He reports having a cardiac workup at that time. He was evaluated by neurologist Dr. [name] in December 2018 after another episode in November 2018 where he was bathing his fiancee's child and he was found on the floor unconscious. MRI brain unremarkable. He had overnight video EEG monitoring at that time which was normal, typical events not captured. He was discharged on Keppra. There seemed to be some improvement in seizure activity. His fiancee reported shaking episodes every night, more on the right side, body stiffness, waking up confused. Depakote was added. He lost his insurance and was off medication for 2-3 months, leading to 3 ER visits in April 2019. He had an eyebrow laceration that was repaired. He was admitted on 02/15/18. Per records he was witnessed to have a brief period of shaking, interactive, following commands during the episode. Routine EEG was normal. He was discharged on Keppra 1500mg  BID and Depakote 1000mg  daily. He was back in the ER on 03/01/18 for another seizure. Depakote level was 47. No medication changes made.   He reports having stages of staring off for a few minutes and feeling lost. This has not occurred in a while. He also has recurrent deja vu episodes occurring 3-4 times a week, he describes this as doing something but feeling like he did it twice. Last episode was a couple of days ago. This is  associated with spinning and a metallic taste, like chewing on copper. Most of the time he sits down, they can last from 10-15 minutes up to a few hours. He denies any recent shaking episodes that his fiancee was reporting, but they have not been sleeping in the same bed due to snoring and kicking/punching in sleep since sleep medication was started. He feels shaking inside his body. His fiancee has not mentioned any staring spells. He denies any epigastric sensation, focal numbness/tingling/weakness, myoclonic jerks. He has migraines that are seasonal, with frontal throbbing pain associated with light/sound sensitivity, no nausea/vomiting. Ibuprofen and sleep helps. No dysphagia, dysarthria, neck pain, bowel/bladder dysfunction. He has low back pain and reports his bones hurt with pain in both legs.  Epilepsy Risk Factors:Hehad a normal birth and early development. There is no history of febrile convulsions, CNS infections such as meningitis/encephalitis, significant traumatic brain injury, neurosurgical procedures, or family history of seizures.    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 He has nocturnal seizures as well. MRI brain and 72-hour EEG normal, however typical events were not captured. They report an increase in seizures with recent medication changes, we discussed starting oxcarbazepine 150mg  BID x 1 week, then 300mg  BID x 1 week, then increase to 600mg  BID. Side effects were discussed. Continue Depakote ER 500mg  to 2 tabs in AM, 3 tabs in PM and Levetiracetam 750mg  to 1 tab in AM, 2 tabs in PM for now, we may plan to wean off LEV in the future since it is causing rage issues. We again discussed [place] driving laws to stop driving after a seizure until 6 months seizure-free. He will  follow-up in 6 months and knows to call for any changes.  Blood pressure 105/72, pulse (!) 58, temperature 97.6 F (36.4 C), temperature source Oral, resp. rate 20, height 5\' 7"  (1.702 m), weight 72.6 kg, SpO2 95 %.  Past Medical History:  Diagnosis Date   Coronary artery disease 08/2017   Stents placed in [place] in 2016. Nonobstructive lesions on 10/18 angiogram.   Depression with anxiety    History of cardiac catheterization    Done in [place] - details not clear   Hyperlipidemia    Migraine    Seizures ([place])     Past Surgical History:  Procedure Laterality Date   CHOLECYSTECTOMY     LEFT HEART CATH AND CORONARY ANGIOGRAPHY N/A 08/23/2017   Procedure: LEFT HEART CATH AND CORONARY ANGIOGRAPHY;  Surgeon: Belva Crome, MD;  Location: [facility];  Service: Cardiovascular;  Laterality: N/A;    Family History  Problem Relation Age of Onset   CAD Father        Premature CAD status post CABG   Heart attack Father    COPD Father    Heart disease Father    CAD Paternal Grandfather        Premature CAD status post CABG   Heart disease Paternal Grandfather    Alcohol abuse Maternal Grandfather    Heart disease Maternal Grandfather    Alcohol abuse Maternal Grandmother    COPD Mother    Hyperlipidemia Sister    Asthma Sister    Asthma Brother    Allergies Sister    Asthma Sister    Multiple sclerosis Daughter    Seizures Daughter     Social History:  reports that he has been smoking cigarettes. He has been smoking about 1.00 pack per day. He has never used smokeless tobacco. He reports previous alcohol use. He reports that he does not use drugs.  Allergies:  Allergies  Allergen Reactions   Aspirin Nausea And Vomiting    325 mg    Other Hives    Walnuts    Medications: Prior to Admission medications   Medication Sig Start Date End Date Taking? Authorizing Provider  divalproex (DEPAKOTE ER) 500 MG 24 hr tablet Take 2 tabs in AM,  3 tabs in PM 02/11/19  Yes Cameron Sprang, MD  levETIRAcetam (KEPPRA) 750 MG tablet Take 1 tab in AM, 2 tabs in PM 02/11/19  Yes Cameron Sprang, MD  Oxcarbazepine (TRILEPTAL) 300 MG tablet Take 1/2 tablet twice a day for 1 week, then increase to 1 tablet twice a day for 1 week, then increase to 2 tablets  twice a day and continue Patient taking differently: 600 mg 2 (two) times daily. Take 1/2 tablet twice a day for 1 week, then increase to 1 tablet twice a day for 1 week, then increase to 2 tablets twice a day and continue 03/15/19  Yes Cameron Sprang, MD  atorvastatin (LIPITOR) 40 MG tablet Take 40 mg by mouth daily. 03/26/18   [provider]  diphenhydrAMINE (BENADRYL) 25 mg capsule Take 25 mg by mouth every 6 (six) hours as needed for itching.    [provider]  FLUoxetine (PROZAC) 40 MG capsule Take 1 capsule (40 mg total) by mouth daily. Patient not taking: Reported on 06/28/2019 07/18/18   Norman Clay, MD    Scheduled Meds:  atorvastatin  40 mg Oral Daily   [START ON 06/29/2019] divalproex  1,000 mg Oral Daily   divalproex  1,500 mg Oral QHS   FLUoxetine  40 mg Oral Daily   heparin injection (subcutaneous)  5,000 Units Subcutaneous Q8H   levETIRAcetam  1,500 mg Oral BID   Oxcarbazepine  300 mg Oral BID   Continuous Infusions:  valproic acid (DEPACON) IVPB 500 mg (06/28/19 1443)   PRN Meds:.diphenhydrAMINE     Results for orders placed or performed during the hospital encounter of 06/28/19 (from the past 48 hour(s))  SARS Coronavirus 2 [facility] order, Performed in [facility] hospital lab) Nasopharyngeal Nasopharyngeal Swab     Status: None   Collection Time: 06/28/19 12:36 PM   Specimen: Nasopharyngeal Swab  Result Value Ref Range   SARS Coronavirus 2 NEGATIVE NEGATIVE    Comment: (NOTE) If result is NEGATIVE SARS-CoV-2 target nucleic acids are NOT DETECTED. The SARS-CoV-2 RNA is generally detectable in upper and lower  respiratory specimens during  the acute phase of infection. The lowest  concentration of SARS-CoV-2 viral copies this assay can detect is 250  copies / mL. A negative result does not preclude SARS-CoV-2 infection  and should not be used as the sole basis for treatment or other  patient management decisions.  A negative result may occur with  improper specimen collection / handling, submission of specimen other  than nasopharyngeal swab, presence of viral mutation(s) within the  areas targeted by this assay, and inadequate number of viral copies  (<250 copies / mL). A negative result must be combined with clinical  observations, patient history, and epidemiological information. If result is POSITIVE SARS-CoV-2 target nucleic acids are DETECTED. The SARS-CoV-2 RNA is generally detectable in upper and lower  respiratory specimens dur ing the acute phase of infection.  Positive  results are indicative of active infection with SARS-CoV-2.  Clinical  correlation with patient history and other diagnostic information is  necessary to determine patient infection status.  Positive results do  not rule out bacterial infection or co-infection with other viruses. If result is PRESUMPTIVE POSTIVE SARS-CoV-2 nucleic acids MAY BE PRESENT.   A presumptive positive result was obtained on the submitted specimen  and confirmed on repeat testing.  While 2019 novel coronavirus  (SARS-CoV-2) nucleic acids may be present in the submitted sample  additional confirmatory testing may be necessary for epidemiological  and / or clinical management purposes  to differentiate between  SARS-CoV-2 and other Sarbecovirus currently known to infect humans.  If clinically indicated additional testing with an alternate test  methodology [phone]) is advised. The SARS-CoV-2 RNA is generally  detectable in upper and lower respiratory sp ecimens during the acute  phase of infection. The expected result is Negative. Fact  Sheet for Patients:   StrictlyIdeas.no Fact Sheet for Healthcare Providers: BankingDealers.co.za This test is not yet approved or cleared by the [place] FDA and has been authorized for detection and/or diagnosis of SARS-CoV-2 by FDA under an Emergency Use Authorization (EUA).  This EUA will remain in effect (meaning this test can be used) for the duration of the COVID-19 declaration under Section 564(b)(1) of the Act, 21 U.S.C. section 360bbb-3(b)(1), unless the authorization is terminated or revoked sooner. Performed at [facility], [address], [place], [place] [address]   CBC with Differential/Platelet     Status: Abnormal   Collection Time: 06/28/19 12:50 PM  Result Value Ref Range   WBC 7.0 4.0 - 10.5 K/uL   RBC 4.77 4.22 - 5.81 MIL/uL   Hemoglobin 15.9 13.0 - 17.0 g/dL   HCT 45.9 39.0 - 52.0 %   MCV 96.2 80.0 - 100.0 fL   MCH 33.3 26.0 - 34.0 pg   MCHC 34.6 30.0 - 36.0 g/dL   RDW 13.7 11.5 - 15.5 %   Platelets 146 (L) 150 - 400 K/uL   nRBC 0.0 0.0 - 0.2 %   Neutrophils Relative % 62 %   Neutro Abs 4.3 1.7 - 7.7 K/uL   Lymphocytes Relative 22 %   Lymphs Abs 1.5 0.7 - 4.0 K/uL   Monocytes Relative 9 %   Monocytes Absolute 0.7 0.1 - 1.0 K/uL   Eosinophils Relative 7 %   Eosinophils Absolute 0.5 0.0 - 0.5 K/uL   Basophils Relative 0 %   Basophils Absolute 0.0 0.0 - 0.1 K/uL   Immature Granulocytes 0 %   Abs Immature Granulocytes 0.02 0.00 - 0.07 K/uL    Comment: Performed at [facility], [address] [place], [place] [address]  Comprehensive metabolic panel     Status: Abnormal   Collection Time: 06/28/19 12:50 PM  Result Value Ref Range   Sodium 136 135 - 145 mmol/L   Potassium 4.0 3.5 - 5.1 mmol/L   Chloride 107 98 - 111 mmol/L   CO2 24 22 - 32 mmol/L   Glucose, Bld 84 70 - 99 mg/dL   BUN 14 6 - 20 mg/dL   Creatinine, Ser 0.67 0.61 - 1.24 mg/dL   Calcium 8.4 (L) 8.9 - 10.3 mg/dL   Total Protein 6.0 (L) 6.5 - 8.1 g/dL   Albumin  3.5 3.5 - 5.0 g/dL   AST 15 15 - 41 U/L   ALT 15 0 - 44 U/L   Alkaline Phosphatase 47 38 - 126 U/L   Total Bilirubin 0.6 0.3 - 1.2 mg/dL   GFR calc non Af Amer >60 >60 mL/min   GFR calc Af Amer >60 >60 mL/min   Anion gap 5 5 - 15    Comment: Performed at [facility], [address], [place], [place] [address]  Valproic acid level     Status: Abnormal   Collection Time: 06/28/19 12:50 PM  Result Value Ref Range   Valproic Acid Lvl 30 (L) 50.0 - 100.0 ug/mL    Comment: Performed at [facility], [address] [place], [place] [address]    Studies/Results:  HEAD CT FINDINGS: CT HEAD FINDINGS  Brain: No evidence of acute infarction, hemorrhage, hydrocephalus, extra-axial collection or mass lesion/mass effect.  Vascular: No hyperdense vessel or unexpected calcification.  Skull: Normal. Negative for fracture or focal lesion.  Sinuses/Orbits: No acute finding.  Other: None.  CT CERVICAL SPINE FINDINGS  Alignment: Normal.  Skull base and vertebrae: No  acute fracture. No primary bone lesion or focal pathologic process.  Soft tissues and spinal canal: No prevertebral fluid or swelling. No visible canal hematoma.  Disc levels: Focal, moderate disc space height loss and osteophytosis at C6-C7.  Upper chest: Negative.  Other: None.  IMPRESSION: 1.  No acute intracranial pathology.  2.  No fracture or static subluxation of the cervical spine.      Astraea Gaughran A. Merlene Laughter, M.D.  Diplomate, Tax adviser of Psychiatry and Neurology ( Neurology). 06/28/2019, 3:39 PM

## 2019-06-28 NOTE — ED Provider Notes (Signed)
[facility] EMERGENCY DEPARTMENT Provider Note   CSN: [id number] Arrival date & time: 06/28/19  1219     History   Chief Complaint Chief Complaint  Patient presents with  . Seizures    HPI Charles Tyler is a 53 y.o. male.     Patient brought in because of seizures.  Patient has a history of seizures,  Pt was given versed and the seizures stopped   The history is provided by the EMS personnel.  Seizures Seizure activity on arrival: no   Seizure type:  Grand mal Preceding symptoms: no sensation of an aura present   Initial focality:  Diffuse Episode characteristics: abnormal movements   Episode characteristics: no combativeness   Postictal symptoms: confusion   Severity:  Moderate Timing:  Clustered   Past Medical History:  Diagnosis Date  . Coronary artery disease 08/2017   Stents placed in [place] in 2016. Nonobstructive lesions on 10/18 angiogram.  . Depression with anxiety   . History of cardiac catheterization    Done in [place] - details not clear  . Hyperlipidemia   . Migraine   . Seizures [facility])     Patient Active Problem List   Diagnosis Date Noted  . Localization-related idiopathic epilepsy and epileptic syndromes with seizures of localized onset, not intractable, without status epilepticus ([place]) 07/03/2018  . Erythrocytosis 04/19/2018  . Coronary artery disease involving native coronary artery of native heart without angina pectoris 02/16/2018  . Tobacco abuse 02/16/2018  . Seizure-like activity ([place]) 02/15/2018  . Depression with anxiety 02/15/2018  . PTSD (post-traumatic stress disorder) 09/14/2017  . MDD (major depressive disorder), recurrent episode, moderate ([place]) 09/14/2017  . Seizure ([place]) 09/10/2017  . Weight loss 09/10/2017  . New onset seizure ([place]) 08/25/2017  . Seizures ([place]) 08/25/2017  . Chest pain 08/22/2017  . History of coronary artery stent placement 08/22/2017  . Hyperlipidemia 08/22/2017  . Back pain 08/22/2017  .  Syncope 08/22/2017  . Coronary artery disease 08/07/2017    Past Surgical History:  Procedure Laterality Date  . CHOLECYSTECTOMY    . LEFT HEART CATH AND CORONARY ANGIOGRAPHY N/A 08/23/2017   Procedure: LEFT HEART CATH AND CORONARY ANGIOGRAPHY;  Surgeon: Belva Crome, MD;  Location: [place] [facility];  Service: Cardiovascular;  Laterality: N/A;        Home Medications    Prior to Admission medications   Medication Sig Start Date End Date Taking? Authorizing Provider  atorvastatin (LIPITOR) 40 MG tablet Take 40 mg by mouth daily. 03/26/18   [provider]  diphenhydrAMINE (BENADRYL) 25 mg capsule Take 25 mg by mouth every 6 (six) hours as needed for itching.    [provider]  divalproex (DEPAKOTE ER) 500 MG 24 hr tablet Take 2 tabs in AM, 3 tabs in PM 02/11/19   Cameron Sprang, MD  FLUoxetine (PROZAC) 40 MG capsule Take 1 capsule (40 mg total) by mouth daily. 07/18/18   Norman Clay, MD  levETIRAcetam (KEPPRA) 750 MG tablet Take 1 tab in AM, 2 tabs in PM 02/11/19   Cameron Sprang, MD  Oxcarbazepine (TRILEPTAL) 300 MG tablet Take 1/2 tablet twice a day for 1 week, then increase to 1 tablet twice a day for 1 week, then increase to 2 tablets twice a day and continue 03/15/19   Cameron Sprang, MD    Family History Family History  Problem Relation Age of Onset  . CAD Father        Premature CAD status post CABG  .  Heart attack Father   . COPD Father   . Heart disease Father   . CAD Paternal Grandfather        Premature CAD status post CABG  . Heart disease Paternal Grandfather   . Alcohol abuse Maternal Grandfather   . Heart disease Maternal Grandfather   . Alcohol abuse Maternal Grandmother   . COPD Mother   . Hyperlipidemia Sister   . Asthma Sister   . Asthma Brother   . Allergies Sister   . Asthma Sister   . Multiple sclerosis Daughter   . Seizures Daughter     Social History Social History   Tobacco Use  . Smoking status: Current Some Day Smoker     Packs/day: 1.00    Types: Cigarettes    Last attempt to quit: 01/05/2018    Years since quitting: 1.4  . Smokeless tobacco: Never Used  . Tobacco comment: off and on   Substance Use Topics  . Alcohol use: Not Currently    Comment: rarely, 09-14-2017 per pt occas  . Drug use: No    Comment: 09-14-2017 per pt stopped 30 years ago - Cocaine, Marijuana and speed      Allergies   Aspirin and Other   Review of Systems Review of Systems  Unable to perform ROS: Mental status change  Neurological: Positive for seizures.     Physical Exam Updated Vital Signs BP 105/72 (BP Location: Left Arm)   Pulse (!) 58   Temp 97.6 F (36.4 C) (Oral)   Resp 20   Ht 5\' 7"  (1.702 m)   Wt 72.6 kg   SpO2 95%   BMI 25.06 kg/m   Physical Exam Vitals signs and nursing note reviewed.  Constitutional:      Appearance: He is well-developed.     Comments: Pt lethargic  HENT:     Head: Normocephalic.  Eyes:     General: No scleral icterus.    Conjunctiva/sclera: Conjunctivae normal.  Neck:     Musculoskeletal: Neck supple.     Thyroid: No thyromegaly.  Cardiovascular:     Rate and Rhythm: Normal rate and regular rhythm.     Heart sounds: No murmur. No friction rub. No gallop.   Pulmonary:     Breath sounds: No stridor. No wheezing or rales.  Chest:     Chest wall: No tenderness.  Abdominal:     General: There is no distension.     Tenderness: There is no abdominal tenderness. There is no rebound.  Musculoskeletal: Normal range of motion.  Lymphadenopathy:     Cervical: No cervical adenopathy.  Skin:    Findings: No erythema or rash.  Neurological:     Motor: No abnormal muscle tone.     Coordination: Coordination normal.     Comments: Pt lethargic      ED Treatments / Results  Labs (all labs ordered are listed, but only abnormal results are displayed) Labs Reviewed  CBC WITH DIFFERENTIAL/PLATELET - Abnormal; Notable for the following components:      Result Value    Platelets 146 (*)    All other components within normal limits  COMPREHENSIVE METABOLIC PANEL - Abnormal; Notable for the following components:   Calcium 8.4 (*)    Total Protein 6.0 (*)    All other components within normal limits  VALPROIC ACID LEVEL - Abnormal; Notable for the following components:   Valproic Acid Lvl 30 (*)    All other components within normal limits  SARS CORONAVIRUS  2 (HOSPITAL ORDER, [place] [facility])  RAPID URINE DRUG SCREEN, HOSP PERFORMED    EKG None  Radiology Ct Head Wo Contrast  Result Date: 06/28/2019 CLINICAL DATA:  Ataxia, head trauma, seizures EXAM: CT HEAD WITHOUT CONTRAST CT CERVICAL SPINE WITHOUT CONTRAST TECHNIQUE: Multidetector CT imaging of the head and cervical spine was performed following the standard protocol without intravenous contrast. Multiplanar CT image reconstructions of the cervical spine were also generated. COMPARISON:  02/16/2018 FINDINGS: CT HEAD FINDINGS Brain: No evidence of acute infarction, hemorrhage, hydrocephalus, extra-axial collection or mass lesion/mass effect. Vascular: No hyperdense vessel or unexpected calcification. Skull: Normal. Negative for fracture or focal lesion. Sinuses/Orbits: No acute finding. Other: None. CT CERVICAL SPINE FINDINGS Alignment: Normal. Skull base and vertebrae: No acute fracture. No primary bone lesion or focal pathologic process. Soft tissues and spinal canal: No prevertebral fluid or swelling. No visible canal hematoma. Disc levels: Focal, moderate disc space height loss and osteophytosis at C6-C7. Upper chest: Negative. Other: None. IMPRESSION: 1.  No acute intracranial pathology. 2.  No fracture or static subluxation of the cervical spine. Electronically Signed   By: Eddie Candle M.D.   On: 06/28/2019 13:59   Ct Cervical Spine Wo Contrast  Result Date: 06/28/2019 CLINICAL DATA:  Ataxia, head trauma, seizures EXAM: CT HEAD WITHOUT CONTRAST CT CERVICAL SPINE WITHOUT CONTRAST  TECHNIQUE: Multidetector CT imaging of the head and cervical spine was performed following the standard protocol without intravenous contrast. Multiplanar CT image reconstructions of the cervical spine were also generated. COMPARISON:  02/16/2018 FINDINGS: CT HEAD FINDINGS Brain: No evidence of acute infarction, hemorrhage, hydrocephalus, extra-axial collection or mass lesion/mass effect. Vascular: No hyperdense vessel or unexpected calcification. Skull: Normal. Negative for fracture or focal lesion. Sinuses/Orbits: No acute finding. Other: None. CT CERVICAL SPINE FINDINGS Alignment: Normal. Skull base and vertebrae: No acute fracture. No primary bone lesion or focal pathologic process. Soft tissues and spinal canal: No prevertebral fluid or swelling. No visible canal hematoma. Disc levels: Focal, moderate disc space height loss and osteophytosis at C6-C7. Upper chest: Negative. Other: None. IMPRESSION: 1.  No acute intracranial pathology. 2.  No fracture or static subluxation of the cervical spine. Electronically Signed   By: Eddie Candle M.D.   On: 06/28/2019 13:59    Procedures Procedures (including critical care time)  Medications Ordered in ED Medications  valproate (DEPACON) 500 mg in dextrose 5 % 50 mL IVPB (500 mg Intravenous New Bag/Given 06/28/19 1443)  heparin injection 5,000 Units (has no administration in time range)  diphenhydrAMINE (BENADRYL) capsule 25 mg (has no administration in time range)  atorvastatin (LIPITOR) tablet 40 mg (has no administration in time range)  FLUoxetine (PROZAC) capsule 40 mg (has no administration in time range)  levETIRAcetam (KEPPRA) tablet 1,500 mg (has no administration in time range)  divalproex (DEPAKOTE ER) 24 hr tablet 1,000 mg (has no administration in time range)  Oxcarbazepine (TRILEPTAL) tablet 300 mg (has no administration in time range)  levETIRAcetam (KEPPRA) IVPB 500 mg/100 mL premix (0 mg Intravenous Stopped 06/28/19 1303)     Initial  Impression / Assessment and Plan / ED Course  I have reviewed the triage vital signs and the nursing notes.  Pertinent labs & imaging results that were available during my care of the patient were reviewed by me and considered in my medical decision making (see chart for details).        CRITICAL CARE Performed by: Milton Ferguson Total critical care time:\40 minutes Critical care time  was exclusive of separately billable procedures and treating other patients. Critical care was necessary to treat or prevent imminent or life-threatening deterioration. Critical care was time spent personally by me on the following activities: development of treatment plan with patient and/or surrogate as well as nursing, discussions with consultants, evaluation of patient's response to treatment, examination of patient, obtaining history from patient or surrogate, ordering and performing treatments and interventions, ordering and review of laboratory studies, ordering and review of radiographic studies, pulse oximetry and re-evaluation of patient's condition. Pt will be consulted by neurology and admitted by medicine Final Clinical Impressions(s) / ED Diagnoses   Final diagnoses:  Seizure [facility])    ED Discharge Orders    None       Milton Ferguson, MD 06/28/19 [phone]

## 2019-06-28 NOTE — ED Triage Notes (Signed)
cbg 81 per ems

## 2019-06-28 NOTE — ED Triage Notes (Signed)
EMS reports pt has history of epilepsy.  EMS says pt has had 4 seizures witnessed by them pta.  EMS gave 2.5mg  versed IV.  Pt hasn't had any more seizures since the versed.  EMS says family says pt was standing in kitchen and fell backwards onto the floor.  EMS put c collar on pt.  EMS says pt not following commands and has been nonverbal.  Reports no change in medications recently and says pt has been compliant with his seizure medication.

## 2019-06-28 NOTE — H&P (Signed)
History and Physical    Charles Tyler [id number] DOB: [birth year] DOA: 06/28/2019  Referring MD/NP/PA: Dr. Roderic Palau  PCP: Rosita Fire, MD  Patient coming from: Home  Chief Complaint: Seizure activity  HPI: Charles Tyler is a 53 y.o. male with a past medical history significant for migraine, hyperlipidemia, tobacco abuse, depression/anxiety and history of nonobstructive coronary artery disease (cath in October 2018); who presented to the emergency department via EMS secondary to seizure activity.  Patient and experiencing seizure activity earlier this morning, EMS was called and while there they were able to presence 3 episodes of seizure.  Patient has been able to maintain her weight protected and after Versed given no further seizure activity reported.  He was transferred to the emergency department for further evaluation and management. Per description appears to be grand mal seizure.  Slow to response and appropriate postictal state appreciated.  No prodromal syndromes, no chest pain, no fever, no chills, no nausea, no vomiting, no chest pain, no headaches, no focal weakness, no dysuria, no hematuria, no melena, no hematochezia.  In the ED CT head was negative for any acute abnormalities.  Basic metabolic panel and complete blood count within normal limits.  Valproic acid level was low.  Patient was loaded with Keppra and Depakote in the ED, neurology consulted and TRH asked to place in observation for further evaluation and management.  Past Medical/Surgical History: Past Medical History:  Diagnosis Date  . Coronary artery disease 08/2017   Stents placed in [place] in 2016. Nonobstructive lesions on 10/18 angiogram.  . Depression with anxiety   . History of cardiac catheterization    Done in [place] - details not clear  . Hyperlipidemia   . Migraine   . Seizures ([place])     Past Surgical History:  Procedure Laterality Date  . CHOLECYSTECTOMY    . LEFT HEART CATH AND  CORONARY ANGIOGRAPHY N/A 08/23/2017   Procedure: LEFT HEART CATH AND CORONARY ANGIOGRAPHY;  Surgeon: Belva Crome, MD;  Location: [facility];  Service: Cardiovascular;  Laterality: N/A;    Social History:  reports that he has been smoking cigarettes. He has been smoking about 1.00 pack per day. He has never used smokeless tobacco. He reports previous alcohol use. He reports that he does not use drugs.  Allergies: Allergies  Allergen Reactions  . Aspirin Nausea And Vomiting    325 mg   . Other Hives    Walnuts    Family History:  Family History  Problem Relation Age of Onset  . CAD Father        Premature CAD status post CABG  . Heart attack Father   . COPD Father   . Heart disease Father   . CAD Paternal Grandfather        Premature CAD status post CABG  . Heart disease Paternal Grandfather   . Alcohol abuse Maternal Grandfather   . Heart disease Maternal Grandfather   . Alcohol abuse Maternal Grandmother   . COPD Mother   . Hyperlipidemia Sister   . Asthma Sister   . Asthma Brother   . Allergies Sister   . Asthma Sister   . Multiple sclerosis Daughter   . Seizures Daughter     Prior to Admission medications   Medication Sig Start Date End Date Taking? Authorizing Provider  divalproex (DEPAKOTE ER) 500 MG 24 hr tablet Take 2 tabs in AM, 3 tabs in PM 02/11/19  Yes Cameron Sprang, MD  levETIRAcetam (KEPPRA) 750 MG  tablet Take 1 tab in AM, 2 tabs in PM 02/11/19  Yes Cameron Sprang, MD  Oxcarbazepine (TRILEPTAL) 300 MG tablet Take 1/2 tablet twice a day for 1 week, then increase to 1 tablet twice a day for 1 week, then increase to 2 tablets twice a day and continue Patient taking differently: 600 mg 2 (two) times daily. Take 1/2 tablet twice a day for 1 week, then increase to 1 tablet twice a day for 1 week, then increase to 2 tablets twice a day and continue 03/15/19  Yes Cameron Sprang, MD  atorvastatin (LIPITOR) 40 MG tablet Take 40 mg by mouth daily. 03/26/18    [provider]  diphenhydrAMINE (BENADRYL) 25 mg capsule Take 25 mg by mouth every 6 (six) hours as needed for itching.    [provider]  FLUoxetine (PROZAC) 40 MG capsule Take 1 capsule (40 mg total) by mouth daily. Patient not taking: Reported on 06/28/2019 07/18/18   Norman Clay, MD    Review of Systems:  Negative except as otherwise mentioned in HPI.  Physical Exam: Vitals:   06/28/19 1530 06/28/19 1545 06/28/19 1600 06/28/19 1630  BP: (!) 102/59  100/65 114/69  Pulse: (!) 53 (!) 55 (!) 57 (!) 53  Resp:      Temp:      TempSrc:      SpO2: 96% 96% 98% 96%  Weight:      Height:       Constitutional: Slow to answer questions, but appropriate.  No chest pain, no nausea, no vomiting; afebrile. Eyes: PERRL, lids and conjunctivae normal, no icterus, no nystagmus. ENMT: Mucous membranes are moist. Posterior pharynx clear of any exudate or lesions. Neck: normal, supple, no masses, no thyromegaly, no JVD Respiratory: clear to auscultation bilaterally, no wheezing, no crackles. Normal respiratory effort. No accessory muscle use.  Cardiovascular: Regular rate and rhythm, no murmurs / rubs / gallops. No extremity edema. 2+ pedal pulses. No carotid bruits.  Abdomen: no tenderness, no masses palpated. No hepatosplenomegaly. Bowel sounds positive.  Musculoskeletal: no clubbing / cyanosis. No joint deformity upper and lower extremities. Good ROM, no contractures. Normal muscle tone.  Skin: no rashes, lesions, ulcers. No induration Neurologic: CN 2-12 grossly intact. Sensation intact, DTR normal. Strength 5/5 in all 4.  Psychiatric: Normal judgment and insight. Alert and oriented x 3. Normal mood.    Labs on Admission: I have personally reviewed the following labs and imaging studies  CBC: Recent Labs  Lab 06/28/19 1250  WBC 7.0  NEUTROABS 4.3  HGB 15.9  HCT 45.9  MCV 96.2  PLT 123456*   Basic Metabolic Panel: Recent Labs  Lab 06/28/19 1250  NA 136  K 4.0  CL  107  CO2 24  GLUCOSE 84  BUN 14  CREATININE 0.67  CALCIUM 8.4*   GFR: Estimated Creatinine Clearance: 99.8 mL/min (by C-G formula based on SCr of 0.67 mg/dL).   Liver Function Tests: Recent Labs  Lab 06/28/19 1250  AST 15  ALT 15  ALKPHOS 47  BILITOT 0.6  PROT 6.0*  ALBUMIN 3.5   Urine analysis:    Component Value Date/Time   COLORURINE YELLOW 04/19/2018 [place] 04/19/2018 1237   LABSPEC 1.019 04/19/2018 1237   PHURINE 5.0 04/19/2018 [place] 04/19/2018 [place] 04/19/2018 [place] 04/19/2018 [place] 04/19/2018 [place] 04/19/2018 1237   NITRITE NEGATIVE 04/19/2018 1237  LEUKOCYTESUR NEGATIVE 04/19/2018 1237    Recent Results (from the past 240 hour(s))  SARS Coronavirus 2 [facility] order, Performed in [facility] hospital lab) Nasopharyngeal Nasopharyngeal Swab     Status: None   Collection Time: 06/28/19 12:36 PM   Specimen: Nasopharyngeal Swab  Result Value Ref Range Status   SARS Coronavirus 2 NEGATIVE NEGATIVE Final    Comment: (NOTE) If result is NEGATIVE SARS-CoV-2 target nucleic acids are NOT DETECTED. The SARS-CoV-2 RNA is generally detectable in upper and lower  respiratory specimens during the acute phase of infection. The lowest  concentration of SARS-CoV-2 viral copies this assay can detect is 250  copies / mL. A negative result does not preclude SARS-CoV-2 infection  and should not be used as the sole basis for treatment or other  patient management decisions.  A negative result may occur with  improper specimen collection / handling, submission of specimen other  than nasopharyngeal swab, presence of viral mutation(s) within the  areas targeted by this assay, and inadequate number of viral copies  (<250 copies / mL). A negative result must be combined with clinical  observations, patient history, and epidemiological information. If result is  POSITIVE SARS-CoV-2 target nucleic acids are DETECTED. The SARS-CoV-2 RNA is generally detectable in upper and lower  respiratory specimens dur ing the acute phase of infection.  Positive  results are indicative of active infection with SARS-CoV-2.  Clinical  correlation with patient history and other diagnostic information is  necessary to determine patient infection status.  Positive results do  not rule out bacterial infection or co-infection with other viruses. If result is PRESUMPTIVE POSTIVE SARS-CoV-2 nucleic acids MAY BE PRESENT.   A presumptive positive result was obtained on the submitted specimen  and confirmed on repeat testing.  While 2019 novel coronavirus  (SARS-CoV-2) nucleic acids may be present in the submitted sample  additional confirmatory testing may be necessary for epidemiological  and / or clinical management purposes  to differentiate between  SARS-CoV-2 and other Sarbecovirus currently known to infect humans.  If clinically indicated additional testing with an alternate test  methodology [id number]) is advised. The SARS-CoV-2 RNA is generally  detectable in upper and lower respiratory sp ecimens during the acute  phase of infection. The expected result is Negative. Fact Sheet for Patients:  StrictlyIdeas.no Fact Sheet for Healthcare Providers: BankingDealers.co.za This test is not yet approved or cleared by the [place] FDA and has been authorized for detection and/or diagnosis of SARS-CoV-2 by FDA under an Emergency Use Authorization (EUA).  This EUA will remain in effect (meaning this test can be used) for the duration of the COVID-19 declaration under Section 564(b)(1) of the Act, 21 U.S.C. section 360bbb-3(b)(1), unless the authorization is terminated or revoked sooner. Performed at [facility], [address], [place], [place] [address]      Radiological Exams on Admission: Ct Head Wo Contrast   Result Date: 06/28/2019 CLINICAL DATA:  Ataxia, head trauma, seizures EXAM: CT HEAD WITHOUT CONTRAST CT CERVICAL SPINE WITHOUT CONTRAST TECHNIQUE: Multidetector CT imaging of the head and cervical spine was performed following the standard protocol without intravenous contrast. Multiplanar CT image reconstructions of the cervical spine were also generated. COMPARISON:  02/16/2018 FINDINGS: CT HEAD FINDINGS Brain: No evidence of acute infarction, hemorrhage, hydrocephalus, extra-axial collection or mass lesion/mass effect. Vascular: No hyperdense vessel or unexpected calcification. Skull: Normal. Negative for fracture or focal lesion. Sinuses/Orbits: No acute finding. Other: None. CT CERVICAL SPINE FINDINGS Alignment: Normal. Skull base and vertebrae:  No acute fracture. No primary bone lesion or focal pathologic process. Soft tissues and spinal canal: No prevertebral fluid or swelling. No visible canal hematoma. Disc levels: Focal, moderate disc space height loss and osteophytosis at C6-C7. Upper chest: Negative. Other: None. IMPRESSION: 1.  No acute intracranial pathology. 2.  No fracture or static subluxation of the cervical spine. Electronically Signed   By: Eddie Candle M.D.   On: 06/28/2019 13:59   Ct Cervical Spine Wo Contrast  Result Date: 06/28/2019 CLINICAL DATA:  Ataxia, head trauma, seizures EXAM: CT HEAD WITHOUT CONTRAST CT CERVICAL SPINE WITHOUT CONTRAST TECHNIQUE: Multidetector CT imaging of the head and cervical spine was performed following the standard protocol without intravenous contrast. Multiplanar CT image reconstructions of the cervical spine were also generated. COMPARISON:  02/16/2018 FINDINGS: CT HEAD FINDINGS Brain: No evidence of acute infarction, hemorrhage, hydrocephalus, extra-axial collection or mass lesion/mass effect. Vascular: No hyperdense vessel or unexpected calcification. Skull: Normal. Negative for fracture or focal lesion. Sinuses/Orbits: No acute finding. Other: None. CT  CERVICAL SPINE FINDINGS Alignment: Normal. Skull base and vertebrae: No acute fracture. No primary bone lesion or focal pathologic process. Soft tissues and spinal canal: No prevertebral fluid or swelling. No visible canal hematoma. Disc levels: Focal, moderate disc space height loss and osteophytosis at C6-C7. Upper chest: Negative. Other: None. IMPRESSION: 1.  No acute intracranial pathology. 2.  No fracture or static subluxation of the cervical spine. Electronically Signed   By: Eddie Candle M.D.   On: 06/28/2019 13:59    EKG: NONE  Assessment/Plan 1-seizure ([place]) -Patient with prior history of seizure disorder -Presented after experiencing for seizure activities 3 of them witnessed by EMS -Slowly recovering after postictal state -Reported to be compliant with medication -CT head negative -Medication has been adjusted following neurology recommendations -Will observe overnight to assure no further ongoing seizure activities. -Per review use a recent evaluation by outpatient neurology they were adjusting his medications and titrating him off Keppra Valproic acid was found to be low on examination. -Will check magnesium level, phosphorus level and UDS.  2-Hyperlipidemia -Reports not currently using Lipitor as previously prescribed -Will check lipid panel. -Heart healthy diet has been ordered.  3-Depression with anxiety -No suicidal ideation or hallucination -Reports currently using Prozac as previously prescribed.  4-Tobacco abuse -I have discussed tobacco cessation with the patient.  I have counseled the patient regarding the negative impacts of continued tobacco use including but not limited to lung cancer, COPD, and cardiovascular disease.  I have discussed alternatives to tobacco and modalities that may help facilitate tobacco cessation including but not limited to biofeedback, hypnosis, and medications.  Total time spent with tobacco counseling was 4 minutes. -nicotine patch ordered    5-history of migraine -Reports no headaches currently -Will use as needed Tylenol for pain.  DVT prophylaxis: Heparin Code Status: Full code Family Communication: No family at bedside. Disposition Plan: Anticipate discharge back home once proven no further ongoing seizure episodes. Consults called: Neurology service (Dr. Merlene Laughter). Admission status: Observation, length of stay less than 2 midnights; telemetry bed.   Time Spent: 55 minutes  Barton Dubois MD [facility] Pager [phone]  06/28/2019, 6:01 PM

## 2019-06-29 ENCOUNTER — Other Ambulatory Visit: Payer: Self-pay

## 2019-06-29 DIAGNOSIS — F418 Other specified anxiety disorders: Secondary | ICD-10-CM | POA: Diagnosis not present

## 2019-06-29 DIAGNOSIS — Z720 Tobacco use: Secondary | ICD-10-CM | POA: Diagnosis not present

## 2019-06-29 DIAGNOSIS — R569 Unspecified convulsions: Secondary | ICD-10-CM | POA: Diagnosis not present

## 2019-06-29 DIAGNOSIS — E785 Hyperlipidemia, unspecified: Secondary | ICD-10-CM | POA: Diagnosis not present

## 2019-06-29 LAB — LIPID PANEL
Cholesterol: 255 mg/dL — ABNORMAL HIGH (ref 0–200)
HDL: 27 mg/dL — ABNORMAL LOW (ref >40)
LDL Cholesterol: 178 mg/dL — ABNORMAL HIGH (ref 0–99)
Total CHOL/HDL Ratio: 9.4 RATIO
Triglycerides: 250 mg/dL — ABNORMAL HIGH (ref <150)
VLDL: 50 mg/dL — ABNORMAL HIGH (ref 0–40)

## 2019-06-29 LAB — HIV ANTIBODY (ROUTINE TESTING W REFLEX): HIV Screen 4th Generation wRfx: NONREACTIVE

## 2019-06-29 MED ORDER — DIVALPROEX SODIUM ER 500 MG PO TB24: 1500.0000 mg | ORAL_TABLET | Freq: Two times a day (BID) | ORAL | 1 refills | Status: AC

## 2019-06-29 MED ORDER — NICOTINE 21 MG/24HR TD PT24: 21.0000 mg | MEDICATED_PATCH | Freq: Every day | TRANSDERMAL | 1 refills | Status: DC

## 2019-06-29 MED ORDER — LEVETIRACETAM 750 MG PO TABS: 1500.0000 mg | ORAL_TABLET | Freq: Two times a day (BID) | ORAL | 3 refills | Status: AC

## 2019-06-29 MED ORDER — ATORVASTATIN CALCIUM 40 MG PO TABS: 40.0000 mg | ORAL_TABLET | Freq: Every day | ORAL | 2 refills | Status: AC

## 2019-06-29 NOTE — Discharge Summary (Signed)
Physician Discharge Summary  Charles Tyler [id number] DOB: 01/04/1966 DOA: 06/28/2019  PCP: Rosita Fire, MD  Admit date: 06/28/2019 Discharge date: 06/29/2019  Time spent: 35 minutes  Recommendations for Outpatient Follow-up:  1. Repeat lipid panel and LFTs in 6-8 weeks; further adjust statin insulin regimen as needed. 2. Repeat basic metabolic panel to follow electrolytes and renal function 3. Patient patient has arrange follow-up with his neurologist and psychiatrist as an outpatient 4. Continue assisting patient with tobacco cessation.   Discharge Diagnoses:  Principal Problem:   Seizure [facility]) Active Problems:   Hyperlipidemia   Depression with anxiety   Tobacco abuse Migraine  Discharge Condition: Stable and improved.  Patient discharged home with instruction to follow-up with PCP and neurology as an outpatient.  He remains being a full code and no further seizure activities were appreciated his observation period prior to discharge.  Diet recommendation: Heart healthy diet.  Filed Weights   06/28/19 1230 06/28/19 2010  Weight: 72.6 kg 74.3 kg    History of present illness:  53 y.o. male with a past medical history significant for migraine, hyperlipidemia, tobacco abuse, depression/anxiety and history of nonobstructive coronary artery disease (cath in October 2018); who presented to the emergency department via EMS secondary to seizure activity.  Patient and experiencing seizure activity earlier this morning, EMS was called and while there they were able to presence 3 episodes of seizure.  Patient has been able to maintain her weight protected and after Versed given no further seizure activity reported.  He was transferred to the emergency department for further evaluation and management. Per description appears to be grand mal seizure.  Slow to response and appropriate postictal state appreciated.  No prodromal syndromes, no chest pain, no fever, no chills, no nausea,  no vomiting, no chest pain, no headaches, no focal weakness, no dysuria, no hematuria, no melena, no hematochezia.  In the ED CT head was negative for any acute abnormalities.  Basic metabolic panel and complete blood count within normal limits.  Valproic acid level was low.  Patient was loaded with Keppra and Depakote in the ED, neurology consulted and TRH asked to place in observation for further evaluation and management.  Hospital Course:  1-seizures -No further seizure activity appreciated after observation for 24 hours -Patient's medications has been adjusted as per neurology service recommendations; his Keppra has been maxed out at 1500 mg twice a day and also his Depakote has been instructed to take 1500 mg twice a day. -Patient will continue same dose of Trileptal -Outpatient follow-up with neurology for further evaluation and management of his seizure disorders. -Alcohol and UDS were negative; CT head without acute intracranial abnormalities.  2-hyperlipidemia -LDL 178 -Prescription for Lipitor has been refilled -Advised to follow heart healthy diet.  3-tobacco abuse -Extensive cessation counseling has been provided -Patient discharged with prescription for nicotine patch.  4-depression with anxiety -No suicidal ideation or hallucinations currently -Patient was previously using Prozac, but having multiple months without using any medications. -Recommended to follow-up with psychiatrist/psychologist service as an outpatient.  5-migraine -No headaches has been reported -Continue the use of as needed Tylenol.  Procedures:  See below for x-ray reports.  Consultations:  Neurology service  Discharge Exam: Vitals:   06/28/19 2010 06/29/19 0535  BP: 113/74 114/72  Pulse: (!) 51 (!) 52  Resp: 18 17  Temp: 98.2 F (36.8 C) 97.9 F (36.6 C)  SpO2: 98% 97%    General: Afebrile, no chest pain, no nausea, no vomiting.  No  further seizure activity appreciated during  hospitalization. Cardiovascular: S1 and S2, no rubs, no gallops, no murmurs. Respiratory: Clear to auscultation bilaterally; good oxygen saturation on room air. Abdomen: Soft, nontender, nondistended, positive bowel sounds Extremities: No edema, no cyanosis, no clubbing.  Discharge Instructions   Discharge Instructions    Discharge instructions   Complete by: As directed    Take medications as prescribed Maintain adequate hydration Stop smoking Arrange follow-up with PCP as an outpatient (10-14 days) Follow-up with neurology in 2 weeks Follow heart healthy diet.     Allergies as of 06/29/2019      Reactions   Aspirin Nausea And Vomiting   325 mg    Other Hives   Walnuts      Medication List    STOP taking these medications   FLUoxetine 40 MG capsule Commonly known as: PROZAC     TAKE these medications   atorvastatin 40 MG tablet Commonly known as: LIPITOR Take 1 tablet (40 mg total) by mouth daily.   diphenhydrAMINE 25 mg capsule Commonly known as: BENADRYL Take 25 mg by mouth every 6 (six) hours as needed for itching.   divalproex 500 MG 24 hr tablet Commonly known as: DEPAKOTE ER Take 3 tablets (1,500 mg total) by mouth 2 (two) times daily. What changed:   how much to take  how to take this  when to take this  additional instructions   levETIRAcetam 750 MG tablet Commonly known as: Keppra Take 2 tablets (1,500 mg total) by mouth 2 (two) times daily. What changed:   how much to take  how to take this  when to take this  additional instructions   nicotine 21 mg/24hr patch Commonly known as: NICODERM CQ - dosed in mg/24 hours Place 1 patch (21 mg total) onto the skin daily.   Oxcarbazepine 300 MG tablet Commonly known as: Trileptal Take 1/2 tablet twice a day for 1 week, then increase to 1 tablet twice a day for 1 week, then increase to 2 tablets twice a day and continue What changed:   how much to take  when to take this       Allergies  Allergen Reactions  . Aspirin Nausea And Vomiting    325 mg   . Other Hives    Walnuts   Follow-up Information    Rosita Fire, MD. Schedule an appointment as soon as possible for a visit in 2 week(s).   Specialty: Internal Medicine Contact information: [place] [address] [phone]           The results of significant diagnostics from this hospitalization (including imaging, microbiology, ancillary and laboratory) are listed below for reference.    Significant Diagnostic Studies: Ct Head Wo Contrast  Result Date: 06/28/2019 CLINICAL DATA:  Ataxia, head trauma, seizures EXAM: CT HEAD WITHOUT CONTRAST CT CERVICAL SPINE WITHOUT CONTRAST TECHNIQUE: Multidetector CT imaging of the head and cervical spine was performed following the standard protocol without intravenous contrast. Multiplanar CT image reconstructions of the cervical spine were also generated. COMPARISON:  02/16/2018 FINDINGS: CT HEAD FINDINGS Brain: No evidence of acute infarction, hemorrhage, hydrocephalus, extra-axial collection or mass lesion/mass effect. Vascular: No hyperdense vessel or unexpected calcification. Skull: Normal. Negative for fracture or focal lesion. Sinuses/Orbits: No acute finding. Other: None. CT CERVICAL SPINE FINDINGS Alignment: Normal. Skull base and vertebrae: No acute fracture. No primary bone lesion or focal pathologic process. Soft tissues and spinal canal: No prevertebral fluid or swelling. No visible canal hematoma. Disc  levels: Focal, moderate disc space height loss and osteophytosis at C6-C7. Upper chest: Negative. Other: None. IMPRESSION: 1.  No acute intracranial pathology. 2.  No fracture or static subluxation of the cervical spine. Electronically Signed   By: Eddie Candle M.D.   On: 06/28/2019 13:59   Ct Cervical Spine Wo Contrast  Result Date: 06/28/2019 CLINICAL DATA:  Ataxia, head trauma, seizures EXAM: CT HEAD WITHOUT CONTRAST CT CERVICAL  SPINE WITHOUT CONTRAST TECHNIQUE: Multidetector CT imaging of the head and cervical spine was performed following the standard protocol without intravenous contrast. Multiplanar CT image reconstructions of the cervical spine were also generated. COMPARISON:  02/16/2018 FINDINGS: CT HEAD FINDINGS Brain: No evidence of acute infarction, hemorrhage, hydrocephalus, extra-axial collection or mass lesion/mass effect. Vascular: No hyperdense vessel or unexpected calcification. Skull: Normal. Negative for fracture or focal lesion. Sinuses/Orbits: No acute finding. Other: None. CT CERVICAL SPINE FINDINGS Alignment: Normal. Skull base and vertebrae: No acute fracture. No primary bone lesion or focal pathologic process. Soft tissues and spinal canal: No prevertebral fluid or swelling. No visible canal hematoma. Disc levels: Focal, moderate disc space height loss and osteophytosis at C6-C7. Upper chest: Negative. Other: None. IMPRESSION: 1.  No acute intracranial pathology. 2.  No fracture or static subluxation of the cervical spine. Electronically Signed   By: Eddie Candle M.D.   On: 06/28/2019 13:59    Microbiology: Recent Results (from the past 240 hour(s))  SARS Coronavirus 2 [facility] order, Performed in [facility] hospital lab) Nasopharyngeal Nasopharyngeal Swab     Status: None   Collection Time: 06/28/19 12:36 PM   Specimen: Nasopharyngeal Swab  Result Value Ref Range Status   SARS Coronavirus 2 NEGATIVE NEGATIVE Final    Comment: (NOTE) If result is NEGATIVE SARS-CoV-2 target nucleic acids are NOT DETECTED. The SARS-CoV-2 RNA is generally detectable in upper and lower  respiratory specimens during the acute phase of infection. The lowest  concentration of SARS-CoV-2 viral copies this assay can detect is 250  copies / mL. A negative result does not preclude SARS-CoV-2 infection  and should not be used as the sole basis for treatment or other  patient management decisions.  A negative result may occur  with  improper specimen collection / handling, submission of specimen other  than nasopharyngeal swab, presence of viral mutation(s) within the  areas targeted by this assay, and inadequate number of viral copies  (<250 copies / mL). A negative result must be combined with clinical  observations, patient history, and epidemiological information. If result is POSITIVE SARS-CoV-2 target nucleic acids are DETECTED. The SARS-CoV-2 RNA is generally detectable in upper and lower  respiratory specimens dur ing the acute phase of infection.  Positive  results are indicative of active infection with SARS-CoV-2.  Clinical  correlation with patient history and other diagnostic information is  necessary to determine patient infection status.  Positive results do  not rule out bacterial infection or co-infection with other viruses. If result is PRESUMPTIVE POSTIVE SARS-CoV-2 nucleic acids MAY BE PRESENT.   A presumptive positive result was obtained on the submitted specimen  and confirmed on repeat testing.  While 2019 novel coronavirus  (SARS-CoV-2) nucleic acids may be present in the submitted sample  additional confirmatory testing may be necessary for epidemiological  and / or clinical management purposes  to differentiate between  SARS-CoV-2 and other Sarbecovirus currently known to infect humans.  If clinically indicated additional testing with an alternate test  methodology [phone]) is advised. The SARS-CoV-2 RNA is generally  detectable in upper and lower respiratory sp ecimens during the acute  phase of infection. The expected result is Negative. Fact Sheet for Patients:  StrictlyIdeas.no Fact Sheet for Healthcare Providers: BankingDealers.co.za This test is not yet approved or cleared by the [place] FDA and has been authorized for detection and/or diagnosis of SARS-CoV-2 by FDA under an Emergency Use Authorization (EUA).  This EUA  will remain in effect (meaning this test can be used) for the duration of the COVID-19 declaration under Section 564(b)(1) of the Act, 21 U.S.C. section 360bbb-3(b)(1), unless the authorization is terminated or revoked sooner. Performed at [facility], [address] [place], [place] [address]      Labs: Basic Metabolic Panel: Recent Labs  Lab 06/28/19 1250  NA 136  K 4.0  CL 107  CO2 24  GLUCOSE 84  BUN 14  CREATININE 0.67  CALCIUM 8.4*  MG 2.2  PHOS 3.4   Liver Function Tests: Recent Labs  Lab 06/28/19 1250  AST 15  ALT 15  ALKPHOS 47  BILITOT 0.6  PROT 6.0*  ALBUMIN 3.5   CBC: Recent Labs  Lab 06/28/19 1250  WBC 7.0  NEUTROABS 4.3  HGB 15.9  HCT 45.9  MCV 96.2  PLT 146*    Signed:  Barton Dubois MD.  [facility] 06/29/2019, 10:37 AM

## 2019-07-02 ENCOUNTER — Other Ambulatory Visit: Payer: Self-pay

## 2019-07-02 ENCOUNTER — Encounter: Payer: Self-pay | Admitting: Gastroenterology

## 2019-07-02 ENCOUNTER — Ambulatory Visit (INDEPENDENT_AMBULATORY_CARE_PROVIDER_SITE_OTHER): Payer: Medicaid Other | Admitting: Gastroenterology

## 2019-07-02 DIAGNOSIS — Z1211 Encounter for screening for malignant neoplasm of colon: Secondary | ICD-10-CM | POA: Diagnosis not present

## 2019-07-02 DIAGNOSIS — Z79899 Other long term (current) drug therapy: Secondary | ICD-10-CM

## 2019-07-02 NOTE — Progress Notes (Signed)
Primary Care Physician:  Rosita Fire, MD Referring provider: Kathie Rhodes, FNP with [facility]ologist:  Garfield Cornea, MD   Chief Complaint  Patient presents with  . Colonoscopy    never had TCS    HPI:  Charles Tyler is a 53 y.o. male here at the request of Carver Fila, FNP with [facility] [place] to schedule first ever colonoscopy.  Patient has a history of erythrocytosis, being monitored by hematology.  Denies family history of colon cancer or colon polyps.  History of illicit drug use more than 30 years ago.  Denies currently.  Bowel movements are regular.  No melena or rectal bleeding.  No abdominal pain.  Denies heartburn, vomiting, dysphagia.  Current Outpatient Medications  Medication Sig Dispense Refill  . atorvastatin (LIPITOR) 40 MG tablet Take 1 tablet (40 mg total) by mouth daily. 30 tablet 2  . divalproex (DEPAKOTE ER) 500 MG 24 hr tablet Take 3 tablets (1,500 mg total) by mouth 2 (two) times daily. 180 tablet 1  . levETIRAcetam (KEPPRA) 750 MG tablet Take 2 tablets (1,500 mg total) by mouth 2 (two) times daily. 90 tablet 3  . Oxcarbazepine (TRILEPTAL) 300 MG tablet Take 1/2 tablet twice a day for 1 week, then increase to 1 tablet twice a day for 1 week, then increase to 2 tablets twice a day and continue (Patient taking differently: 600 mg 2 (two) times daily. Take 1/2 tablet twice a day for 1 week, then increase to 1 tablet twice a day for 1 week, then increase to 2 tablets twice a day and continue) 120 tablet 6   No current facility-administered medications for this visit.     Allergies as of 07/02/2019 - Review Complete 07/02/2019  Allergen Reaction Noted  . Aspirin Nausea And Vomiting 08/22/2017  . Other Hives 01/24/2018    Past Medical History:  Diagnosis Date  . Coronary artery disease 08/2017   Stents placed in [place] in 2016. Nonobstructive lesions on 10/18 angiogram.  . Depression with anxiety   . History  of cardiac catheterization    Done in [place] - details not clear  . Hyperlipidemia   . Migraine   . Seizures ([place])     Past Surgical History:  Procedure Laterality Date  . CHOLECYSTECTOMY    . LEFT HEART CATH AND CORONARY ANGIOGRAPHY N/A 08/23/2017   Procedure: LEFT HEART CATH AND CORONARY ANGIOGRAPHY;  Surgeon: Belva Crome, MD;  Location: [facility];  Service: Cardiovascular;  Laterality: N/A;    Family History  Problem Relation Age of Onset  . CAD Father        Premature CAD status post CABG  . Heart attack Father   . COPD Father   . Heart disease Father   . CAD Paternal Grandfather        Premature CAD status post CABG  . Heart disease Paternal Grandfather   . Alcohol abuse Maternal Grandfather   . Heart disease Maternal Grandfather   . Alcohol abuse Maternal Grandmother   . COPD Mother   . Hyperlipidemia Sister   . Asthma Sister   . Asthma Brother   . Allergies Sister   . Asthma Sister   . Multiple sclerosis Daughter   . Seizures Daughter   . Colon cancer Neg Hx   . Colon polyps Neg Hx     Social History   Socioeconomic History  . Marital status: Significant Other    Spouse name: Not on file  .  Number of children: 4  . Years of education: 2 years college  . Highest education level: Not on file  Occupational History  . Occupation: Unemployed  Social Needs  . Financial resource strain: Not on file  . Food insecurity    Worry: Not on file    Inability: Not on file  . Transportation needs    Medical: Not on file    Non-medical: Not on file  Tobacco Use  . Smoking status: Current Some Day Smoker    Packs/day: 1.00    Types: Cigarettes    Last attempt to quit: 01/05/2018    Years since quitting: 1.4  . Smokeless tobacco: Never Used  . Tobacco comment: off and on   Substance and Sexual Activity  . Alcohol use: Not Currently  . Drug use: No    Comment: 09-14-2017 per pt stopped 30 years ago - Cocaine, Marijuana and speed   . Sexual activity:  Yes    Birth control/protection: None  Lifestyle  . Physical activity    Days per week: Not on file    Minutes per session: Not on file  . Stress: Not on file  Relationships  . Social Herbalist on phone: Not on file    Gets together: Not on file    Attends religious service: Not on file    Active member of club or organization: Not on file    Attends meetings of clubs or organizations: Not on file    Relationship status: Not on file  . Intimate partner violence    Fear of current or ex partner: Not on file    Emotionally abused: Not on file    Physically abused: Not on file    Forced sexual activity: Not on file  Other Topics Concern  . Not on file  Social History Narrative   Lives at home with his fiancee.   Right-handed.   2 cups caffeine per day.      ROS:  General: Negative for anorexia, weight loss, fever, chills, fatigue, weakness. Eyes: Negative for vision changes.  ENT: Negative for hoarseness, difficulty swallowing , nasal congestion. CV: Negative for chest pain, angina, palpitations, dyspnea on exertion, peripheral edema.  Respiratory: Negative for dyspnea at rest, dyspnea on exertion, cough, sputum, wheezing.  GI: See history of present illness. GU:  Negative for dysuria, hematuria, urinary incontinence, urinary frequency, nocturnal urination.  MS: Negative for joint pain, low back pain.  Derm: Negative for rash or itching.  Neuro: Negative for weakness, abnormal sensation, frequent headaches, memory loss, confusion.  History of seizure disorder. Psych: Negative for anxiety, depression, suicidal ideation, hallucinations.  Endo: Negative for unusual weight change.  Heme: Negative for bruising or bleeding. Allergy: Negative for rash or hives.    Physical Examination:  BP 115/73   Pulse 63   Temp (!) 96.6 F (35.9 C) (Temporal)   Ht 5\' 7"  (1.702 m)   Wt 164 lb 3.2 oz (74.5 kg)   BMI 25.72 kg/m    General: Well-nourished, well-developed in  no acute distress.  Head: Normocephalic, atraumatic.   Eyes: Conjunctiva pink, no icterus. Mouth: Oropharyngeal mucosa moist and pink , no lesions erythema or exudate. Neck: Supple without thyromegaly, masses, or lymphadenopathy.  Lungs: Clear to auscultation bilaterally.  Heart: Regular rate and rhythm, no murmurs rubs or gallops.  Abdomen: Bowel sounds are normal, nontender, nondistended, no hepatosplenomegaly or masses, no abdominal bruits or    hernia , no rebound or guarding.  Rectal: Not performed Extremities: No lower extremity edema. No clubbing or deformities.  Neuro: Alert and oriented x 4 , grossly normal neurologically.  Skin: Warm and dry, no rash or jaundice.   Psych: Alert and cooperative, normal mood and affect.  Labs: Lab Results  Component Value Date   CREATININE 0.67 06/28/2019   BUN 14 06/28/2019   NA 136 06/28/2019   K 4.0 06/28/2019   CL 107 06/28/2019   CO2 24 06/28/2019   Lab Results  Component Value Date   ALT 15 06/28/2019   AST 15 06/28/2019   ALKPHOS 47 06/28/2019   BILITOT 0.6 06/28/2019   Lab Results  Component Value Date   WBC 7.0 06/28/2019   HGB 15.9 06/28/2019   HCT 45.9 06/28/2019   MCV 96.2 06/28/2019   PLT 146 (L) 06/28/2019     Imaging Studies: Ct Head Wo Contrast  Result Date: 06/28/2019 CLINICAL DATA:  Ataxia, head trauma, seizures EXAM: CT HEAD WITHOUT CONTRAST CT CERVICAL SPINE WITHOUT CONTRAST TECHNIQUE: Multidetector CT imaging of the head and cervical spine was performed following the standard protocol without intravenous contrast. Multiplanar CT image reconstructions of the cervical spine were also generated. COMPARISON:  02/16/2018 FINDINGS: CT HEAD FINDINGS Brain: No evidence of acute infarction, hemorrhage, hydrocephalus, extra-axial collection or mass lesion/mass effect. Vascular: No hyperdense vessel or unexpected calcification. Skull: Normal. Negative for fracture or focal lesion. Sinuses/Orbits: No acute finding. Other:  None. CT CERVICAL SPINE FINDINGS Alignment: Normal. Skull base and vertebrae: No acute fracture. No primary bone lesion or focal pathologic process. Soft tissues and spinal canal: No prevertebral fluid or swelling. No visible canal hematoma. Disc levels: Focal, moderate disc space height loss and osteophytosis at C6-C7. Upper chest: Negative. Other: None. IMPRESSION: 1.  No acute intracranial pathology. 2.  No fracture or static subluxation of the cervical spine. Electronically Signed   By: Eddie Candle M.D.   On: 06/28/2019 13:59   Ct Cervical Spine Wo Contrast  Result Date: 06/28/2019 CLINICAL DATA:  Ataxia, head trauma, seizures EXAM: CT HEAD WITHOUT CONTRAST CT CERVICAL SPINE WITHOUT CONTRAST TECHNIQUE: Multidetector CT imaging of the head and cervical spine was performed following the standard protocol without intravenous contrast. Multiplanar CT image reconstructions of the cervical spine were also generated. COMPARISON:  02/16/2018 FINDINGS: CT HEAD FINDINGS Brain: No evidence of acute infarction, hemorrhage, hydrocephalus, extra-axial collection or mass lesion/mass effect. Vascular: No hyperdense vessel or unexpected calcification. Skull: Normal. Negative for fracture or focal lesion. Sinuses/Orbits: No acute finding. Other: None. CT CERVICAL SPINE FINDINGS Alignment: Normal. Skull base and vertebrae: No acute fracture. No primary bone lesion or focal pathologic process. Soft tissues and spinal canal: No prevertebral fluid or swelling. No visible canal hematoma. Disc levels: Focal, moderate disc space height loss and osteophytosis at C6-C7. Upper chest: Negative. Other: None. IMPRESSION: 1.  No acute intracranial pathology. 2.  No fracture or static subluxation of the cervical spine. Electronically Signed   By: Eddie Candle M.D.   On: 06/28/2019 13:59

## 2019-07-02 NOTE — Patient Instructions (Signed)
1. Colonoscopy as scheduled. See separate instructions.  

## 2019-07-04 NOTE — Assessment & Plan Note (Signed)
Pleasant 53 year old gentleman with history of erythrocytosis, seizure disorder, remote illicit drug use who presents to schedule first ever colonoscopy.  No family history of colon cancer or colon polyps.  No GI symptoms.  Plan for colonoscopy with deep sedation with Dr. Gala Romney in the future.  I have discussed the risks, alternatives, benefits with regards to but not limited to the risk of reaction to medication, bleeding, infection, perforation and the patient is agreeable to proceed. Written consent to be obtained.

## 2019-07-08 ENCOUNTER — Telehealth: Payer: Self-pay | Admitting: *Deleted

## 2019-07-08 NOTE — Telephone Encounter (Signed)
LMOVM to schedule TCS with propofol with [facility] 

## 2019-07-10 NOTE — Telephone Encounter (Signed)
Letter mailed to call back

## 2019-07-22 ENCOUNTER — Other Ambulatory Visit: Payer: Self-pay | Admitting: *Deleted

## 2019-07-22 ENCOUNTER — Encounter: Payer: Self-pay | Admitting: *Deleted

## 2019-07-22 ENCOUNTER — Telehealth: Payer: Self-pay | Admitting: Internal Medicine

## 2019-07-22 DIAGNOSIS — Z1211 Encounter for screening for malignant neoplasm of colon: Secondary | ICD-10-CM

## 2019-07-22 MED ORDER — CLENPIQ 10-3.5-12 MG-GM -GM/160ML PO SOLN: 1.0000 | Freq: Once | ORAL | 0 refills | Status: AC

## 2019-07-22 NOTE — Telephone Encounter (Signed)
Called patient at both #'s listed-LMOVM

## 2019-07-22 NOTE — Telephone Encounter (Signed)
Patient called back. He is scheduled for procedure 11/23 at 2:00pm. Patient aware will mail instructions/pre-op appt/covid-19 testing appt. Confirmed address. Orders entered. RX sent to pharmacy

## 2019-07-22 NOTE — Telephone Encounter (Signed)
I believe I put the wrong number in the previous note. [phone] or [phone]

## 2019-07-22 NOTE — Telephone Encounter (Signed)
Pt received a letter to call to schedule his colonoscopy. [phone]

## 2019-09-27 ENCOUNTER — Other Ambulatory Visit (HOSPITAL_COMMUNITY)
Admission: RE | Admit: 2019-09-27 | Discharge: 2019-09-27 | Disposition: A | Discharge status: Home / Self Care | Payer: Medicaid Other | Source: Ambulatory Visit | Attending: Internal Medicine | Admitting: Internal Medicine

## 2019-09-27 ENCOUNTER — Other Ambulatory Visit: Payer: Self-pay

## 2019-09-27 ENCOUNTER — Encounter (HOSPITAL_COMMUNITY)
Admission: RE | Admit: 2019-09-27 | Discharge: 2019-09-27 | Disposition: A | Discharge status: Home / Self Care | Payer: Medicaid Other | Source: Ambulatory Visit | Attending: Internal Medicine | Admitting: Internal Medicine

## 2019-09-30 ENCOUNTER — Telehealth: Payer: Self-pay

## 2019-09-30 ENCOUNTER — Ambulatory Visit (HOSPITAL_COMMUNITY): Admission: RE | Admit: 2019-09-30 | Payer: Medicaid Other | Source: Home / Self Care | Admitting: Internal Medicine

## 2019-09-30 ENCOUNTER — Encounter (HOSPITAL_COMMUNITY): Admission: RE | Payer: Self-pay | Source: Home / Self Care

## 2019-09-30 SURGERY — COLONOSCOPY WITH PROPOFOL
Anesthesia: Monitor Anesthesia Care

## 2019-09-30 NOTE — Telephone Encounter (Signed)
noted 

## 2019-09-30 NOTE — Telephone Encounter (Signed)
Barbara at [facility] called office and LMOVM. Pt was no show for pre-op appt and COVID test. TCS for today was cancelled.  Tried to call pt, received message "call rejected". Per chart, endo had tried to call pt several times. Letter mailed.  FYI to LSL.

## 2019-10-02 ENCOUNTER — Ambulatory Visit: Payer: Self-pay | Admitting: Neurology

## 2019-11-18 ENCOUNTER — Other Ambulatory Visit (HOSPITAL_COMMUNITY): Payer: Self-pay | Admitting: *Deleted

## 2019-11-18 DIAGNOSIS — D751 Secondary polycythemia: Secondary | ICD-10-CM

## 2019-11-19 ENCOUNTER — Inpatient Hospital Stay (HOSPITAL_COMMUNITY): Payer: Medicaid Other | Attending: Hematology

## 2019-11-26 ENCOUNTER — Ambulatory Visit (HOSPITAL_COMMUNITY): Payer: Medicaid Other | Admitting: Nurse Practitioner

## 2023-07-12 LAB — LAB REPORT - SCANNED: EGFR: 23
